# Patient Record
Sex: Female | Born: 1950 | Race: White | Hispanic: No | Marital: Married | Smoking: Former smoker
Health system: Southern US, Community
[De-identification: ages and names within clinical notes are randomized; demographics above are authoritative.]

## PROBLEM LIST (undated history)

## (undated) DIAGNOSIS — J45909 Unspecified asthma, uncomplicated: Secondary | ICD-10-CM

## (undated) DIAGNOSIS — K219 Gastro-esophageal reflux disease without esophagitis: Secondary | ICD-10-CM

## (undated) DIAGNOSIS — I10 Essential (primary) hypertension: Secondary | ICD-10-CM

## (undated) HISTORY — DX: Unspecified asthma, uncomplicated: J45.909

## (undated) HISTORY — DX: Gastro-esophageal reflux disease without esophagitis: K21.9

## (undated) HISTORY — PX: ABDOMINAL HYSTERECTOMY: SHX81

## (undated) HISTORY — DX: Essential (primary) hypertension: I10

---

## 2013-11-02 ENCOUNTER — Ambulatory Visit (INDEPENDENT_AMBULATORY_CARE_PROVIDER_SITE_OTHER): Payer: Self-pay

## 2013-11-02 ENCOUNTER — Ambulatory Visit (INDEPENDENT_AMBULATORY_CARE_PROVIDER_SITE_OTHER): Payer: Self-pay | Admitting: Physician Assistant

## 2013-11-02 ENCOUNTER — Encounter: Payer: Self-pay | Admitting: Physician Assistant

## 2013-11-02 VITALS — BP 140/77 | HR 94 | Ht 63.0 in | Wt 178.0 lb

## 2013-11-02 DIAGNOSIS — F411 Generalized anxiety disorder: Secondary | ICD-10-CM

## 2013-11-02 DIAGNOSIS — R0602 Shortness of breath: Secondary | ICD-10-CM

## 2013-11-02 DIAGNOSIS — F17200 Nicotine dependence, unspecified, uncomplicated: Secondary | ICD-10-CM

## 2013-11-02 DIAGNOSIS — J449 Chronic obstructive pulmonary disease, unspecified: Secondary | ICD-10-CM | POA: Insufficient documentation

## 2013-11-02 DIAGNOSIS — K219 Gastro-esophageal reflux disease without esophagitis: Secondary | ICD-10-CM

## 2013-11-02 DIAGNOSIS — Z720 Tobacco use: Secondary | ICD-10-CM

## 2013-11-02 NOTE — Progress Notes (Signed)
Subjective:    Patient ID: Ana King, female    DOB: Mar 23, 1951, 63 y.o.   MRN: [id number]  HPI Patient is a 63 year old female who presents to the clinic with her daughter to establish care. Patient has a past medical history of GERD and a 40 year tobacco use history. She takes over-the-counter Prevacid and it controls her acid reflux. She is currently trying to stop smoking about decreasing her cigarette. She is down to half a pack a day. She would like to get acupuncture that her husband did to help him stop smoking. She is not interested in medication today.   She is currently without insurance. She is concerned because she smokes and she has been more short of breath lately. She had a recent exacerbation with bronchitis about a month ago. She feels much improved but is still having to use her proair regularly. She feels like it helps her to breathe. She has noticed that she avoids walking 2 to shortness of breath. She denies any wheezing. She has a cough but nonproductive at this time. She denies any fever, chills, sinus pressure, ear pain.  Patient is concerned that she has lung cancer. She has some pain in her mid upper back on both sides. She admits to sleeping on the couch most nights. She has a habit of falling asleep watching TV. She denies any pain with breathing or coughing. Pain is when she presses on her back. She can take ibuprofen and gets rid of the pain.  [name]. History   Social History  . Marital Status: Married    Spouse Name: N/A    Number of Children: N/A  . Years of Education: N/A   Occupational History  . Not on file.   Social History Main Topics  . Smoking status: Current Every Day Smoker  . Smokeless tobacco: Not on file  . Alcohol Use: No  . Drug Use: Not on file  . Sexual Activity: No   Other Topics Concern  . Not on file   Social History Narrative  . No narrative on file   .Ana King does not currently have medications on file. . Family History   Problem Relation Age of Onset  . Cancer Mother   . Diabetes Maternal Aunt   . Hypertension Maternal Aunt   . Cancer Maternal Grandmother   . Diabetes Maternal Grandmother       Review of Systems  All other systems reviewed and are negative.      Objective:   Physical Exam  Constitutional: She is oriented to person, place, and time. She appears well-developed and well-nourished.  HENT:  Head: Normocephalic and atraumatic.  Cardiovascular: Normal rate, regular rhythm and normal heart sounds.   Pulmonary/Chest: Effort normal and breath sounds normal. She has no wheezes.  Musculoskeletal:  Pain over her rhomboid muscles to palpation bilateral sides of back. No pain over her spine.  Neurological: She is alert and oriented to person, place, and time.  Skin: Skin is dry.  Psychiatric: She has a normal mood and affect. Her behavior is normal.          Assessment & Plan:  Shortness of breath/tobacco abuse/COPD- pulse ox is 90% today. patient does not have insurance at this time. We have discussed spirometry to confirm COPD but decided against that since she has no way to pay. Will start with Spriva one puff daily. Samples were given today. She has to get insurance in the next month or so. We will  then send to the pharmacy. Followup in 4-6 weeks. Patient hasn't every day smoker for 40 years. Will get a chest x-ray. This will help ease her concerns of lung cancer and confirm lung changes. Patient has decreased number of cigarettes for the past couple months. She reports she is going to get acupuncture like her husband to see if she can stop smoking. Discussed that is the best that port stopping progression of COPD.  Mid back pain- discuss with patient very suggestive of musculoskeletal issue. She is sleeping on the couch most every night. Discussed stop sleeping on the couch pressure she's got good supportive bed. Can take ibuprofen and consider heat and ice to relax the muscles. If not  improving or if worsening please call office.  GERD-patient gets Prevacid over-the-counter do to no insurance. Continue with treatment.   Advance directives information was given to patient.  Depression screening was 0/2. GAD 7 was 6. Mild anxiety. We discussed this in detail and were lysed it was more about her health and how she constantly thinks of the worse case in area. Right now she is coming in she has lung cancer.

## 2013-11-02 NOTE — Patient Instructions (Addendum)
I puff daily.   Smoking Cessation Quitting smoking is important to your health and has many advantages. However, it is not always easy to quit since nicotine is a very addictive drug. Often times, people try 3 times or more before being able to quit. This document explains the best ways for you to prepare to quit smoking. Quitting takes hard work and a lot of effort, but you can do it. ADVANTAGES OF QUITTING SMOKING  You will live longer, feel better, and live better.  Your body will feel the impact of quitting smoking almost immediately.  Within 20 minutes, blood pressure decreases. Your pulse returns to its normal level.  After 8 hours, carbon monoxide levels in the blood return to normal. Your oxygen level increases.  After 24 hours, the chance of having a heart attack starts to decrease. Your breath, hair, and body stop smelling like smoke.  After 48 hours, damaged nerve endings begin to recover. Your sense of taste and smell improve.  After 72 hours, the body is virtually free of nicotine. Your bronchial tubes relax and breathing becomes easier.  After 2 to 12 weeks, lungs can hold more air. Exercise becomes easier and circulation improves.  The risk of having a heart attack, stroke, cancer, or lung disease is greatly reduced.  After 1 year, the risk of coronary heart disease is cut in half.  After 5 years, the risk of stroke falls to the same as a nonsmoker.  After 10 years, the risk of lung cancer is cut in half and the risk of other cancers decreases significantly.  After 15 years, the risk of coronary heart disease drops, usually to the level of a nonsmoker.  If you are pregnant, quitting smoking will improve your chances of having a healthy baby.  The people you live with, especially any children, will be healthier.  You will have extra money to spend on things other than cigarettes. QUESTIONS TO THINK ABOUT BEFORE ATTEMPTING TO QUIT You may want to talk about your  answers with your caregiver.  Why do you want to quit?  If you tried to quit in the past, what helped and what did not?  What will be the most difficult situations for you after you quit? How will you plan to handle them?  Who can help you through the tough times? Your family? Friends? A caregiver?  What pleasures do you get from smoking? What ways can you still get pleasure if you quit? Here are some questions to ask your caregiver:  How can you help me to be successful at quitting?  What medicine do you think would be best for me and how should I take it?  What should I do if I need more help?  What is smoking withdrawal like? How can I get information on withdrawal? GET READY  Set a quit date.  Change your environment by getting rid of all cigarettes, ashtrays, matches, and lighters in your home, car, or work. Do not let people smoke in your home.  Review your past attempts to quit. Think about what worked and what did not. GET SUPPORT AND ENCOURAGEMENT You have a better chance of being successful if you have help. You can get support in many ways.  Tell your family, friends, and co-workers that you are going to quit and need their support. Ask them not to smoke around you.  Get individual, group, or telephone counseling and support. Programs are available at [facility]. Call  your local health department for information about programs in your area.  Spiritual beliefs and practices may help some smokers quit.  Download a "quit meter" on your computer to keep track of quit statistics, such as how long you have gone without smoking, cigarettes not smoked, and money saved.  Get a self-help book about quitting smoking and staying off of tobacco. LEARN NEW SKILLS AND BEHAVIORS  Distract yourself from urges to smoke. Talk to someone, go for a walk, or occupy your time with a task.  Change your normal routine. Take a different route to work. Drink tea  instead of coffee. Eat breakfast in a different place.  Reduce your stress. Take a hot bath, exercise, or read a book.  Plan something enjoyable to do every day. Reward yourself for not smoking.  Explore interactive web-based programs that specialize in helping you quit. GET MEDICINE AND USE IT CORRECTLY Medicines can help you stop smoking and decrease the urge to smoke. Combining medicine with the above behavioral methods and support can greatly increase your chances of successfully quitting smoking.  Nicotine replacement therapy helps deliver nicotine to your body without the negative effects and risks of smoking. Nicotine replacement therapy includes nicotine gum, lozenges, inhalers, nasal sprays, and skin patches. Some may be available over-the-counter and others require a prescription.  Antidepressant medicine helps people abstain from smoking, but how this works is unknown. This medicine is available by prescription.  Nicotinic receptor partial agonist medicine simulates the effect of nicotine in your brain. This medicine is available by prescription. Ask your caregiver for advice about which medicines to use and how to use them based on your health history. Your caregiver will tell you what side effects to look out for if you choose to be on a medicine or therapy. Carefully read the information on the package. Do not use any other product containing nicotine while using a nicotine replacement product.  RELAPSE OR DIFFICULT SITUATIONS Most relapses occur within the first 3 months after quitting. Do not be discouraged if you start smoking again. Remember, most people try several times before finally quitting. You may have symptoms of withdrawal because your body is used to nicotine. You may crave cigarettes, be irritable, feel very hungry, cough often, get headaches, or have difficulty concentrating. The withdrawal symptoms are only temporary. They are strongest when you first quit, but they  will go away within 10 14 days. To reduce the chances of relapse, try to:  Avoid drinking alcohol. Drinking lowers your chances of successfully quitting.  Reduce the amount of caffeine you consume. Once you quit smoking, the amount of caffeine in your body increases and can give you symptoms, such as a rapid heartbeat, sweating, and anxiety.  Avoid smokers because they can make you want to smoke.  Do not let weight gain distract you. Many smokers will gain weight when they quit, usually less than 10 pounds. Eat a healthy diet and stay active. You can always lose the weight gained after you quit.  Find ways to improve your mood other than smoking. FOR MORE INFORMATION  www.smokefree.gov  Document Released: 07/01/2001 Document Revised: 01/06/2012 Document Reviewed: 10/16/2011 [facility] Patient Information 2014 [employer] [place].

## 2013-12-07 ENCOUNTER — Encounter: Payer: Self-pay | Admitting: Physician Assistant

## 2013-12-07 ENCOUNTER — Ambulatory Visit (INDEPENDENT_AMBULATORY_CARE_PROVIDER_SITE_OTHER): Payer: Self-pay | Admitting: Physician Assistant

## 2013-12-07 VITALS — BP 148/92 | HR 91 | Ht 64.0 in | Wt 177.0 lb

## 2013-12-07 DIAGNOSIS — K449 Diaphragmatic hernia without obstruction or gangrene: Secondary | ICD-10-CM

## 2013-12-07 DIAGNOSIS — R0789 Other chest pain: Secondary | ICD-10-CM

## 2013-12-07 DIAGNOSIS — K219 Gastro-esophageal reflux disease without esophagitis: Secondary | ICD-10-CM

## 2013-12-07 MED ORDER — SUCRALFATE 1 G PO TABS: 1.0000 g | ORAL_TABLET | Freq: Four times a day (QID) | ORAL | Status: DC

## 2013-12-07 MED ORDER — OMEPRAZOLE 40 MG PO CPDR: 40.0000 mg | DELAYED_RELEASE_CAPSULE | Freq: Every day | ORAL | Status: DC

## 2013-12-07 MED ORDER — GI COCKTAIL ~~LOC~~
30.0000 mL | Freq: Once | ORAL | Status: AC
  Administered 2013-12-07: 30 mL via ORAL

## 2013-12-07 NOTE — Progress Notes (Signed)
   Subjective:    Patient ID: Ana King, female    DOB: [birth year], 63 y.o.   MRN: [id number]  HPI Patient is a 63 year old female who presents to the clinic with atypical chest pain coming from the epigastric area into her left chest. Her first symptoms started yesterday and continued into today. They are not worsening but staying the same. She's had similar symptoms in the past and has always ended up being GERD or hiatal hernia related. She has been to ER in the past 4 years and always GI related. Her husband is concerned and that is why she is here today. She denies any arm pain or jaw pain. She denies any vomiting or sweating. Pain right now is 1/10 on a 10 scale. She continues to eat and drink. Her bowel movements are unchanged and denies any blood or mucus. Symptoms are worse in the morning and late at night. She has taken Prevacid twice a day over-the-counter. She admits to eating a lot of [place] spicy foods this week.  Review of Systems  All other systems reviewed and are negative.      Objective:   Physical Exam  Constitutional: She is oriented to person, place, and time. She appears well-developed and well-nourished.  HENT:  Head: Normocephalic and atraumatic.  Cardiovascular: Normal rate, regular rhythm and normal heart sounds.   No murmur heard. Pulmonary/Chest: Effort normal and breath sounds normal. She has no wheezes.  Abdominal: Soft. Bowel sounds are normal. She exhibits no distension and no mass. There is no tenderness. There is no rebound and no guarding.  Neurological: She is alert and oriented to person, place, and time.  Skin: Skin is dry.  Psychiatric: She has a normal mood and affect. Her behavior is normal.          Assessment & Plan:  Chest pain/GERD/Hiatal hernia- GI cocktail given in office with 90 percent relief.  EKG- NSR at 96. No ST elevation or depression. Incomplete RBBB. No EKG to compare.   Reassured pt does not appear cardiac. If symptoms  continue could consider stress test.  Will start omeprazole daily. Carafate given to use as needed at meal time.  I do not feel like it is any ulcer at this point. If not improving could consider h.pylori testing. No tenderness over RUQ-gallbladder etiology unlikely.  GERD diet handout given. Call with any complications.   Pt still does not have insurance. When get's insurance will get health maintence items done.

## 2013-12-09 DIAGNOSIS — K449 Diaphragmatic hernia without obstruction or gangrene: Secondary | ICD-10-CM | POA: Insufficient documentation

## 2013-12-14 ENCOUNTER — Encounter: Payer: Self-pay | Admitting: *Deleted

## 2014-01-18 ENCOUNTER — Telehealth: Payer: Self-pay | Admitting: *Deleted

## 2014-01-18 ENCOUNTER — Other Ambulatory Visit: Payer: Self-pay | Admitting: Physician Assistant

## 2014-01-18 MED ORDER — ALBUTEROL SULFATE (2.5 MG/3ML) 0.083% IN NEBU: 2.5000 mg | INHALATION_SOLUTION | RESPIRATORY_TRACT | Status: DC | PRN

## 2014-01-18 NOTE — Telephone Encounter (Signed)
Ok will send

## 2014-01-18 NOTE — Telephone Encounter (Signed)
Pt's daughter was in office today & stated that pt wanted to know if you could rx her albuterol neb solution.  She has a neb at home.

## 2014-01-18 NOTE — Telephone Encounter (Signed)
Pt notified of rx. 

## 2014-03-15 ENCOUNTER — Encounter: Payer: Self-pay | Admitting: Physician Assistant

## 2014-03-15 ENCOUNTER — Ambulatory Visit (INDEPENDENT_AMBULATORY_CARE_PROVIDER_SITE_OTHER): Payer: Self-pay | Admitting: Physician Assistant

## 2014-03-15 VITALS — BP 128/83 | HR 95 | Temp 98.2°F | Ht 64.0 in | Wt 180.0 lb

## 2014-03-15 DIAGNOSIS — R1032 Left lower quadrant pain: Secondary | ICD-10-CM

## 2014-03-15 DIAGNOSIS — R319 Hematuria, unspecified: Secondary | ICD-10-CM

## 2014-03-15 DIAGNOSIS — D72829 Elevated white blood cell count, unspecified: Secondary | ICD-10-CM

## 2014-03-15 LAB — CBC WITH DIFFERENTIAL/PLATELET
Basophils Absolute: 0 10*3/uL (ref 0.0–0.1)
Basophils Relative: 0 % (ref 0–1)
EOS PCT: 1 % (ref 0–5)
Eosinophils Absolute: 0.1 10*3/uL (ref 0.0–0.7)
HCT: 41.7 % (ref 36.0–46.0)
HEMOGLOBIN: 14.1 g/dL (ref 12.0–15.0)
Lymphocytes Relative: 21 % (ref 12–46)
Lymphs Abs: 2.4 10*3/uL (ref 0.7–4.0)
MCH: 29.7 pg (ref 26.0–34.0)
MCHC: 33.8 g/dL (ref 30.0–36.0)
MCV: 87.9 fL (ref 78.0–100.0)
MONO ABS: 0.3 10*3/uL (ref 0.1–1.0)
MONOS PCT: 3 % (ref 3–12)
Neutro Abs: 8.4 10*3/uL — ABNORMAL HIGH (ref 1.7–7.7)
Neutrophils Relative %: 75 % (ref 43–77)
Platelets: 308 10*3/uL (ref 150–400)
RBC: 4.74 MIL/uL (ref 3.87–5.11)
RDW: 12.9 % (ref 11.5–15.5)
WBC: 11.2 10*3/uL — ABNORMAL HIGH (ref 4.0–10.5)

## 2014-03-15 LAB — COMPLETE METABOLIC PANEL WITH GFR
ALBUMIN: 4.7 g/dL (ref 3.5–5.2)
ALT: 10 U/L (ref 0–35)
AST: 10 U/L (ref 0–37)
Alkaline Phosphatase: 104 U/L (ref 39–117)
BUN: 13 mg/dL (ref 6–23)
CALCIUM: 10 mg/dL (ref 8.4–10.5)
CO2: 23 meq/L (ref 19–32)
Chloride: 101 mEq/L (ref 96–112)
Creat: 0.71 mg/dL (ref 0.50–1.10)
GFR, Est Non African American: >89 mL/min
Glucose, Bld: 102 mg/dL — ABNORMAL HIGH (ref 70–99)
POTASSIUM: 4.8 meq/L (ref 3.5–5.3)
Sodium: 138 mEq/L (ref 135–145)
Total Bilirubin: 0.5 mg/dL (ref 0.2–1.2)
Total Protein: 7.5 g/dL (ref 6.0–8.3)

## 2014-03-15 LAB — POCT URINALYSIS DIPSTICK
BILIRUBIN UA: NEGATIVE
Glucose, UA: NEGATIVE
Ketones, UA: NEGATIVE
Leukocytes, UA: NEGATIVE
Nitrite, UA: NEGATIVE
PROTEIN UA: NEGATIVE
Spec Grav, UA: 1.01
Urobilinogen, UA: 1
pH, UA: 6.5

## 2014-03-15 NOTE — Progress Notes (Signed)
Subjective:    Patient ID: Ana King, female    DOB: 22-Jan-1951, 63 y.o.   MRN: [id number]  HPI Pt presents to the clinic with left lower quadrant pain. Started last week about 6 days ago. Pain is not constant but occasional. Ibuprofen dulls the pain. Pain when occurs is very sharp about 5/10. She denies any constipation or diarrhea. No nausea or vomiting. No fever or chills. Ibuprofen seems to help. Pain is getting better and not using ibuprofen anymore. Still occurs all of the sudden and abdomen is tender. 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 No urinary pain or frequency changes. No acid reflux symptoms.    Review of Systems  All other systems reviewed and are negative.      Objective:   Physical Exam  Constitutional: She is oriented to person, place, and time. She appears well-developed and well-nourished.  HENT:  Head: Normocephalic and atraumatic.  Cardiovascular: Normal rate, regular rhythm and normal heart sounds.   Pulmonary/Chest: Effort normal and breath sounds normal.  No CVA tenderness.   Abdominal: Soft.  LLQ pain with deep palpation. No rebound or guarding. No masses.   Neurological: She is alert and oriented to person, place, and time.  Skin: Skin is dry.  Psychiatric: She has a normal mood and affect. Her behavior is normal.          Assessment & Plan:  LLQ pain- low suscipion for diverticulitis. Will get CBC and CMP. .. Results for orders placed in visit on 03/15/14  CBC WITH DIFFERENTIAL      Result Value Ref Range   WBC 11.2 (*) 4.0 - 10.5 K/uL   RBC 4.74  3.87 - 5.11 MIL/uL   Hemoglobin 14.1  12.0 - 15.0 g/dL   HCT 41.7  36.0 - 46.0 %   MCV 87.9  78.0 - 100.0 fL   MCH 29.7  26.0 - 34.0 pg   MCHC 33.8  30.0 - 36.0 g/dL   RDW 12.9  11.5 - 15.5 %   Platelets 308  150 - 400 K/uL   Neutrophils Relative % 75  43 - 77 %   Neutro Abs 8.4 (*) 1.7 - 7.7 K/uL   Lymphocytes Relative 21  12 - 46 %   Lymphs Abs 2.4  0.7 - 4.0 K/uL   Monocytes Relative 3  3 -  12 %   Monocytes Absolute 0.3  0.1 - 1.0 K/uL   Eosinophils Relative 1  0 - 5 %   Eosinophils Absolute 0.1  0.0 - 0.7 K/uL   Basophils Relative 0  0 - 1 %   Basophils Absolute 0.0  0.0 - 0.1 K/uL   Smear Review Criteria for review not met    COMPLETE METABOLIC PANEL WITH GFR      Result Value Ref Range   Sodium 138  135 - 145 mEq/L   Potassium 4.8  3.5 - 5.3 mEq/L   Chloride 101  96 - 112 mEq/L   CO2 23  19 - 32 mEq/L   Glucose, Bld 102 (*) 70 - 99 mg/dL   BUN 13  6 - 23 mg/dL   Creat 0.71  0.50 - 1.10 mg/dL   Total Bilirubin 0.5  0.2 - 1.2 mg/dL   Alkaline Phosphatase 104  39 - 117 U/L   AST 10  0 - 37 U/L   ALT 10  0 - 35 U/L   Total Protein 7.5  6.0 - 8.3 g/dL   Albumin 4.7  3.5 -  5.2 g/dL   Calcium 10.0  8.4 - 10.5 mg/dL   GFR, Est African American >89     GFR, Est Non African American >89    POCT URINALYSIS DIPSTICK      Result Value Ref Range   Color, UA yellow     Clarity, UA clear     Glucose, UA neg     Bilirubin, UA neg     Ketones, UA neg     Spec Grav, UA 1.010     Blood, UA trace-lysed     pH, UA 6.5     Protein, UA neg     Urobilinogen, UA 1.0     Nitrite, UA neg     Leukocytes, UA Negative     Trace blood will get urine culture.  Discussed with pt WBC being elevated. Will get STAT CT scan of abd/pelvis.  Red flags or worsening symptoms discussed and to call office.  Follow up as needed.

## 2014-03-17 ENCOUNTER — Ambulatory Visit (INDEPENDENT_AMBULATORY_CARE_PROVIDER_SITE_OTHER): Payer: Self-pay

## 2014-03-17 DIAGNOSIS — D72829 Elevated white blood cell count, unspecified: Secondary | ICD-10-CM

## 2014-03-17 DIAGNOSIS — R1032 Left lower quadrant pain: Secondary | ICD-10-CM

## 2014-03-17 DIAGNOSIS — K5732 Diverticulitis of large intestine without perforation or abscess without bleeding: Secondary | ICD-10-CM

## 2014-03-17 DIAGNOSIS — K7689 Other specified diseases of liver: Secondary | ICD-10-CM

## 2014-03-17 DIAGNOSIS — Z90710 Acquired absence of both cervix and uterus: Secondary | ICD-10-CM

## 2014-03-17 LAB — URINE CULTURE

## 2014-03-17 MED ORDER — IOHEXOL 300 MG/ML  SOLN
100.0000 mL | Freq: Once | INTRAMUSCULAR | Status: AC | PRN
  Administered 2014-03-17: 100 mL via INTRAVENOUS

## 2014-04-17 ENCOUNTER — Encounter: Payer: Self-pay | Admitting: Physician Assistant

## 2014-04-17 ENCOUNTER — Ambulatory Visit (INDEPENDENT_AMBULATORY_CARE_PROVIDER_SITE_OTHER): Payer: Self-pay | Admitting: Physician Assistant

## 2014-04-17 VITALS — BP 143/76 | HR 93 | Ht 64.0 in | Wt 185.0 lb

## 2014-04-17 DIAGNOSIS — J441 Chronic obstructive pulmonary disease with (acute) exacerbation: Secondary | ICD-10-CM

## 2014-04-17 DIAGNOSIS — J42 Unspecified chronic bronchitis: Secondary | ICD-10-CM

## 2014-04-17 MED ORDER — PREDNISONE 50 MG PO TABS: ORAL_TABLET | ORAL | Status: DC

## 2014-04-17 MED ORDER — TIOTROPIUM BROMIDE MONOHYDRATE 2.5 MCG/ACT IN AERS: INHALATION_SPRAY | RESPIRATORY_TRACT | Status: DC

## 2014-04-17 NOTE — Patient Instructions (Signed)
Take prednisone daily for 5 days.  Start back on sprivia daily 2 puffs daily.

## 2014-04-17 NOTE — Progress Notes (Addendum)
   Subjective:    Patient ID: Ana King, female    DOB: 1950-09-14, 63 y.o.   MRN: [id number]  HPI Pt presents to the clinic with her husband. She comes in because she has been more SOB when walking and moving. She is on her 5th day of no smoking. She denies any productive cough. She denies any fever, chills, n/v/d, headache, wheezing. She has not tried anything to make better. She has been out of spiriva samples for last couple of weeks. She does not have rescue inhaler due to cost    Review of Systems  All other systems reviewed and are negative.      Objective:   Physical Exam  Constitutional: She is oriented to person, place, and time. She appears well-developed and well-nourished.  HENT:  Head: Normocephalic and atraumatic.  Right Ear: External ear normal.  Left Ear: External ear normal.  Nose: Nose normal.  Mouth/Throat: Oropharynx is clear and moist.  TM's clear.  Negative maxillary tenderness.    Eyes: Conjunctivae are normal. Right eye exhibits no discharge. Left eye exhibits no discharge.  Neck: Normal range of motion. Neck supple.  Cardiovascular: Normal rate, regular rhythm and normal heart sounds.   Pulmonary/Chest: Effort normal and breath sounds normal. She has no wheezes.  Coarse breath sounds.   Lymphadenopathy:    She has no cervical adenopathy.  Neurological: She is alert and oriented to person, place, and time.  Skin: Skin is dry.  Psychiatric: She has a normal mood and affect. Her behavior is normal.          Assessment & Plan:  COPD exacerbation- prednisone  for 5 days given. Restart spiriva. Gave respimat because only sample we had. Went over how to use inhaler. 2 puffs once daily. I really think going off maintenance inhaler causes worsening SOB. No samples of albuterol. Pt cannot afford. She does have nebulizer at home instructed to use every 4-6 hours as needed for SOB. If not improving or worsening then follow up or call to decide on  further treatment. Pt is working on insurance but right now we are going to have to keep her with samples.

## 2014-04-25 ENCOUNTER — Telehealth: Payer: Self-pay | Admitting: Family Medicine

## 2014-04-25 MED ORDER — IPRATROPIUM-ALBUTEROL 20-100 MCG/ACT IN AERS: 1.0000 | INHALATION_SPRAY | Freq: Four times a day (QID) | RESPIRATORY_TRACT | Status: DC | PRN

## 2014-04-25 NOTE — Telephone Encounter (Signed)
Pt with her husband today requesting samples of spiriva, we didn't have any of these samples but she was provided with a combivent respimat as a substitution until she can follow up with her PCP Ana King.

## 2014-05-24 ENCOUNTER — Ambulatory Visit (INDEPENDENT_AMBULATORY_CARE_PROVIDER_SITE_OTHER): Payer: Self-pay | Admitting: *Deleted

## 2014-05-24 ENCOUNTER — Encounter: Payer: Self-pay | Admitting: Family Medicine

## 2014-05-24 ENCOUNTER — Ambulatory Visit (INDEPENDENT_AMBULATORY_CARE_PROVIDER_SITE_OTHER): Payer: Self-pay | Admitting: Family Medicine

## 2014-05-24 VITALS — Temp 97.8°F

## 2014-05-24 DIAGNOSIS — Z23 Encounter for immunization: Secondary | ICD-10-CM

## 2014-05-24 NOTE — Progress Notes (Signed)
Patient ID: [name], female   DOB: 1951/06/17, 63 y.o.   MRN: [id number] Pt here for flu shot. Immunization given and tolerated well.Loralee PacasBarkley, [name]

## 2014-06-26 ENCOUNTER — Other Ambulatory Visit: Payer: Self-pay | Admitting: *Deleted

## 2014-06-26 ENCOUNTER — Encounter: Payer: Self-pay | Admitting: Physician Assistant

## 2014-06-26 MED ORDER — TIOTROPIUM BROMIDE MONOHYDRATE 2.5 MCG/ACT IN AERS: INHALATION_SPRAY | RESPIRATORY_TRACT | Status: DC

## 2014-07-31 ENCOUNTER — Ambulatory Visit (INDEPENDENT_AMBULATORY_CARE_PROVIDER_SITE_OTHER): Payer: Self-pay | Admitting: Physician Assistant

## 2014-07-31 ENCOUNTER — Encounter: Payer: Self-pay | Admitting: Physician Assistant

## 2014-07-31 VITALS — BP 139/87 | HR 93 | Temp 97.3°F | Wt 192.0 lb

## 2014-07-31 DIAGNOSIS — J441 Chronic obstructive pulmonary disease with (acute) exacerbation: Secondary | ICD-10-CM

## 2014-07-31 MED ORDER — PREDNISONE 50 MG PO TABS: ORAL_TABLET | ORAL | Status: DC

## 2014-07-31 MED ORDER — FLUTICASONE FUROATE-VILANTEROL 100-25 MCG/INH IN AEPB: 1.0000 | INHALATION_SPRAY | Freq: Every day | RESPIRATORY_TRACT | Status: DC

## 2014-07-31 NOTE — Progress Notes (Signed)
   Subjective:    Patient ID: Ana King, female    DOB: 20-Mar-1951, 64 y.o.   MRN: [id number]  HPI Patient presents to the clinic with worsening shortness of breath. She has a diagnosis of COPD. Patient does not have insurance and not able to afford inhalers. She ran out of Spiriva 1-2 weeks ago. She has noticed best when her shortness of breath occurs. She feels like she cannot do her daily tasks as she could on Spiriva. I will visit she was also given Combivent she cannot afford this either. 2 months and if there is anything she can afford. She denies any fever, chills, productive cough.   Review of Systems  All other systems reviewed and are negative.      Objective:   Physical Exam  Constitutional: She is oriented to person, place, and time. She appears well-developed and well-nourished.  HENT:  Head: Normocephalic and atraumatic.  Cardiovascular: Normal rate, regular rhythm and normal heart sounds.   Pulmonary/Chest: Effort normal and breath sounds normal. She has no wheezes.  Neurological: She is alert and oriented to person, place, and time.  Psychiatric: She has a normal mood and affect. Her behavior is normal.          Assessment & Plan:  COPD exacerbation- it is likely that exacerbation is due to not being on Spiriva. Brief it does not have a car to make it more affordable. We do not always have samples to give her. We'll try Breo 1 puff daily. Discussed how to use inhaler in office today. Coupon card given and explained how to activate intake to pharmacy. Hopefully this will give her 1 year free product. Follow-up if shortness of breath or breathing worsening or not improving. I did go ahead and give her 5 days of prednisone to get ahead of some of the inflammation from not being on any maintence inhaler.

## 2014-08-14 ENCOUNTER — Telehealth: Payer: Self-pay | Admitting: Physician Assistant

## 2014-08-14 NOTE — Telephone Encounter (Signed)
Give sample of spiriva to start back. Can use spiriva with BREO inhaler. Make sure using BREO correctly. If running fever, signifcant SOB needs to come in to listen to lungs.

## 2014-08-14 NOTE — Telephone Encounter (Signed)
Spiriva respimat sample given & logged in book.

## 2014-08-14 NOTE — Telephone Encounter (Signed)
Ana King the breo inhaler is no longer working for Ameren CorporationDebbie she has been on the couch for 2 days having trouble catching her breath. She stopped the inhaler yesterday and just used her Nebs every 4 hours wasn't sure if you wanted to see her or just try a different medication. - CF

## 2014-08-23 ENCOUNTER — Other Ambulatory Visit: Payer: Self-pay | Admitting: Physician Assistant

## 2014-10-04 ENCOUNTER — Encounter: Payer: Self-pay | Admitting: Physician Assistant

## 2014-10-04 ENCOUNTER — Ambulatory Visit (INDEPENDENT_AMBULATORY_CARE_PROVIDER_SITE_OTHER): Payer: Self-pay | Admitting: Physician Assistant

## 2014-10-04 VITALS — BP 147/80 | HR 89 | Ht 64.0 in | Wt 198.0 lb

## 2014-10-04 DIAGNOSIS — R0789 Other chest pain: Secondary | ICD-10-CM

## 2014-10-04 NOTE — Patient Instructions (Signed)
Consider zyrtec/claritin once daily.  flonase OtC 2 sprays each nostril  tums as needed for indigestion.

## 2014-10-04 NOTE — Progress Notes (Signed)
   Subjective:    Patient ID: Ana King, female    DOB: 1950-11-19, 64 y.o.   MRN: [id number]  HPI  Pt presents to the clinic with atypical chest pain that occurred once on Monday night for a few hours. Since has been fine. Did a nebulizer before she went to bed. Pain took her breath away and felt like some pressure on chest. No wheezing. On spiriva. Taking prevacid. Does not remember what she had to eat that night. No left arm numbness, no jaw pain.    Review of Systems  All other systems reviewed and are negative.      Objective:   Physical Exam  Constitutional: She is oriented to person, place, and time. She appears well-developed and well-nourished.  HENT:  Head: Normocephalic and atraumatic.  Cardiovascular: Normal rate, regular rhythm and normal heart sounds.   Pulmonary/Chest: Effort normal and breath sounds normal. She has no wheezes.  Neurological: She is alert and oriented to person, place, and time.  Skin: Skin is dry.  Psychiatric: She has a normal mood and affect. Her behavior is normal.          Assessment & Plan:  Atypical chest pain- only one episode. EKG unchanged. Pulse ox looks good today. Could have breathed in some dust or had some indigestion. If keeps occuring. Consider stress test.   COPD- controlled. Pt given sample of stilto because we did not have sprivia.

## 2014-10-23 ENCOUNTER — Telehealth: Payer: Self-pay | Admitting: *Deleted

## 2014-10-23 MED ORDER — ALBUTEROL SULFATE (2.5 MG/3ML) 0.083% IN NEBU: INHALATION_SOLUTION | RESPIRATORY_TRACT | Status: DC

## 2014-10-23 NOTE — Telephone Encounter (Signed)
See if will have spiriva sample or stiolto?

## 2014-10-24 NOTE — Telephone Encounter (Signed)
Spoke with Mr Ana King  & informed that a sample inhaler is @ the front Office for Ana King. Mr Ana King asked if [name] was in today that she could bring it.

## 2015-03-08 ENCOUNTER — Encounter: Payer: Self-pay | Admitting: Family Medicine

## 2015-03-08 ENCOUNTER — Ambulatory Visit (INDEPENDENT_AMBULATORY_CARE_PROVIDER_SITE_OTHER): Payer: Self-pay | Admitting: Family Medicine

## 2015-03-08 VITALS — BP 144/90 | HR 95 | Ht 64.0 in | Wt 197.0 lb

## 2015-03-08 DIAGNOSIS — R0602 Shortness of breath: Secondary | ICD-10-CM

## 2015-03-08 DIAGNOSIS — R0789 Other chest pain: Secondary | ICD-10-CM

## 2015-03-08 DIAGNOSIS — J42 Unspecified chronic bronchitis: Secondary | ICD-10-CM

## 2015-03-08 MED ORDER — AZITHROMYCIN 250 MG PO TABS: ORAL_TABLET | ORAL | Status: DC

## 2015-03-08 MED ORDER — PREDNISONE 20 MG PO TABS: 40.0000 mg | ORAL_TABLET | Freq: Every day | ORAL | Status: DC

## 2015-03-08 NOTE — Patient Instructions (Signed)
Do not fill the prednisone and antibiotic and let she feels like you aren't getting worse, developed a cough with sputum, or certainly run a fever. Please let us know if you do start taking the antibiotic.  If chest pain persists or suddenly gets worse or radiates into her neck or arm then please go to the emergency department.

## 2015-03-08 NOTE — Progress Notes (Signed)
Subjective:    Patient ID: Ana King, female    DOB: 07-13-1951, 64 y.o.   MRN: [id number]  HPI 62 you female with a hx of COPD.  She is now on Stiolo and doing well and uses her albuterol tx every night before bedtime.   C/O of  pain under her left breast Mon that started while she was just sitting, no pain Tues, then a little more discomfort since yesterday when active. Started feeling more SOB yesterday with activity. Has not hearing some bubbling in her abdominal. No abdominal pain. Normal BMS. Says the pain comes and goes. Better with IBUprofen. Pain lasts seconds at a time. Feels like a squeezing.  No nausea or vomiting. No cough. Has been sneezing a lot.    Review of Systems     BP 144/90 mmHg  Pulse 95  Ht 5\' 4"  (1.626 m)  Wt 197 lb (89.359 kg)  BMI 33.80 kg/m2  SpO2 95%  PF     Allergies  Allergen Reactions  . Penicillins     Past Medical History  Diagnosis Date  . GERD (gastroesophageal reflux disease)     Past Surgical History  Procedure Laterality Date  . Abdominal hysterectomy      Social History   Social History  . Marital Status: Married    Spouse Name: N/A  . Number of Children: N/A  . Years of Education: N/A   Occupational History  . Not on file.   Social History Main Topics  . Smoking status: Current Every Day Smoker    Start date: 05/15/2014  . Smokeless tobacco: Not on file  . Alcohol Use: No  . Drug Use: Not on file  . Sexual Activity: No   Other Topics Concern  . Not on file   Social History Narrative    Family History  Problem Relation Age of Onset  . Cancer Mother   . Diabetes Maternal Aunt   . Hypertension Maternal Aunt   . Cancer Maternal Grandmother   . Diabetes Maternal Grandmother     Outpatient Encounter Prescriptions as of 03/08/2015  Medication Sig  . albuterol (PROVENTIL) (2.5 MG/3ML) 0.083% nebulizer solution inhale contents of 1 vial every 4 hours if needed for wheezing or shortness of breath  .  Lansoprazole (PREVACID PO) Take 20 mg by mouth 2 (two) times daily.  . Tiotropium Bromide-Olodaterol (STIOLTO RESPIMAT) 2.5-2.5 MCG/ACT AERS Inhale into the lungs.  [name] azithromycin (ZITHROMAX) 250 MG tablet 2 tabs on Day 1, then one a day x 4 days.  . predniSONE (DELTASONE) 20 MG tablet Take 2 tablets (40 mg total) by mouth daily.  . [DISCONTINUED] predniSONE (DELTASONE) 50 MG tablet Take one tablet for 5 days.  . [DISCONTINUED] sucralfate (CARAFATE) 1 G tablet Take 1 tablet (1 g total) by mouth 4 (four) times daily.  . [DISCONTINUED] tiotropium (SPIRIVA) 18 MCG inhalation capsule Place 18 mcg into inhaler and inhale daily.   No facility-administered encounter medications on file as of 03/08/2015.       Objective:   Physical Exam  Constitutional: She is oriented to person, place, and time. She appears well-developed and well-nourished.  HENT:  Head: Normocephalic and atraumatic.  Right Ear: External ear normal.  Left Ear: External ear normal.  Nose: Nose normal.  Mouth/Throat: Oropharynx is clear and moist.  TMs and canals are clear.   Eyes: Conjunctivae and EOM are normal. Pupils are equal, round, and reactive to light.  Neck: Neck supple. No thyromegaly present.  Cardiovascular:  Normal rate, regular rhythm and normal heart sounds.   Pulmonary/Chest: Effort normal and breath sounds normal. She has no wheezes. She exhibits tenderness.  Tender over the lower sternum   Lymphadenopathy:    She has no cervical adenopathy.  Neurological: She is alert and oriented to person, place, and time.  Skin: Skin is warm and dry.  Psychiatric: She has a normal mood and affect.          Assessment & Plan:  COPD, chronic - she is more short of breath but denies any cough or sputum. She has had some sneezing says she's not sure she may be coming down with a cold or not. Since we are getting ready to have into the weekend and got a printed a prescription for prednisone and an antibiotic just in  case over the weekend she feels like she's developing a cough or suddenly gets worse. She develops a fever then please let us know. Use albuterol every 4-6 hours as needed. Continue with  Stiolto.  Left chest pain under breast/atypical chest pain - EKG shows normal sinus rhythm with no acute changes and normal axis. No changes from previous. When she's able to get insurance think she might benefit from a treadmill stress test and she's now had a couple episodes of chest pain though this episode isn't a different location than when she was seen back in March which is why had decided to repeat her EKG. She is a smoker. We'll also get a CMP and a CBC. Again she doesn't have insurance so I'm not sure if she will be able to afford the blood work but encouraged her to go to the lab and find out how much it might cost. We did Stamp her paperwork as self-pay which should give her a discounted price.

## 2015-05-07 ENCOUNTER — Ambulatory Visit (INDEPENDENT_AMBULATORY_CARE_PROVIDER_SITE_OTHER): Payer: Self-pay | Admitting: Physician Assistant

## 2015-05-07 VITALS — BP 136/73 | HR 83 | Temp 98.5°F

## 2015-05-07 DIAGNOSIS — Z23 Encounter for immunization: Secondary | ICD-10-CM

## 2015-07-09 ENCOUNTER — Encounter: Payer: Self-pay | Admitting: Physician Assistant

## 2015-07-09 ENCOUNTER — Ambulatory Visit (INDEPENDENT_AMBULATORY_CARE_PROVIDER_SITE_OTHER): Payer: Self-pay | Admitting: Physician Assistant

## 2015-07-09 VITALS — BP 128/78 | HR 93 | Wt 200.0 lb

## 2015-07-09 DIAGNOSIS — IMO0001 Reserved for inherently not codable concepts without codable children: Secondary | ICD-10-CM

## 2015-07-09 DIAGNOSIS — J441 Chronic obstructive pulmonary disease with (acute) exacerbation: Secondary | ICD-10-CM

## 2015-07-09 DIAGNOSIS — R030 Elevated blood-pressure reading, without diagnosis of hypertension: Secondary | ICD-10-CM

## 2015-07-09 MED ORDER — PREDNISONE 20 MG PO TABS: ORAL_TABLET | ORAL | Status: DC

## 2015-07-09 NOTE — Progress Notes (Signed)
   Subjective:    Patient ID: Ana King, female    DOB: Apr 01, 1951, 64 y.o.   MRN: [id number]  HPI Patient is a 64 year old female who presents to the clinic with increased shortness of breath and chest tightness for the last week. Patient takes Stilto inhaler daily for COPD. she does use her rescue inhaler at bedtime every day but lately has been using it multiple times a day. She she feels very short of breath, wheezing and chest tightness. She denies any ear pain, sore throat, sinus pressure, nasal congestion, headache, nausea, fevers or body aches. She has not tried anything to make better. She believes that when she colored her sister's hair she inhaled some of the chemicals and it started these symptoms.   Review of Systems  All other systems reviewed and are negative.      Objective:   Physical Exam  Constitutional: She is oriented to person, place, and time. She appears well-developed and well-nourished.  HENT:  Head: Normocephalic and atraumatic.  Right Ear: External ear normal.  Left Ear: External ear normal.  Nose: Nose normal.  Mouth/Throat: Oropharynx is clear and moist. No oropharyngeal exudate.  Eyes: Conjunctivae are normal. Right eye exhibits no discharge. Left eye exhibits no discharge.  Neck: Normal range of motion. Neck supple.  Cardiovascular: Normal rate, regular rhythm and normal heart sounds.   Pulmonary/Chest: Effort normal.  Coarse breath sounds.   Lymphadenopathy:    She has no cervical adenopathy.  Neurological: She is alert and oriented to person, place, and time.  Psychiatric: She has a normal mood and affect. Her behavior is normal.          Assessment & Plan:  COPD exacerbation due to chemical- pulse ox is reassuring. does not seem to be bacterial. Will treat with prednisone taper. Pt has printed abx copy if needed or if symptoms worsen. Continue stiolto daily. Rescue inhaler daily as needed for next couple of days. If worsening or not  improving call office.    Elevated blood pressure- rechecked and great. Will continue to monitor.

## 2015-07-12 ENCOUNTER — Other Ambulatory Visit: Payer: Self-pay | Admitting: Physician Assistant

## 2015-08-03 ENCOUNTER — Ambulatory Visit (INDEPENDENT_AMBULATORY_CARE_PROVIDER_SITE_OTHER): Payer: Self-pay | Admitting: Physician Assistant

## 2015-08-03 ENCOUNTER — Encounter: Payer: Self-pay | Admitting: Physician Assistant

## 2015-08-03 VITALS — BP 160/76 | HR 82 | Temp 98.8°F | Ht 64.0 in | Wt 204.0 lb

## 2015-08-03 DIAGNOSIS — F419 Anxiety disorder, unspecified: Secondary | ICD-10-CM

## 2015-08-03 DIAGNOSIS — J42 Unspecified chronic bronchitis: Secondary | ICD-10-CM

## 2015-08-03 DIAGNOSIS — F410 Panic disorder [episodic paroxysmal anxiety] without agoraphobia: Secondary | ICD-10-CM

## 2015-08-03 MED ORDER — SERTRALINE HCL 50 MG PO TABS: 50.0000 mg | ORAL_TABLET | Freq: Every day | ORAL | Status: DC

## 2015-08-03 MED ORDER — BUDESONIDE-FORMOTEROL FUMARATE 160-4.5 MCG/ACT IN AERO: 2.0000 | INHALATION_SPRAY | Freq: Two times a day (BID) | RESPIRATORY_TRACT | Status: DC

## 2015-08-03 MED ORDER — LORAZEPAM 0.5 MG PO TABS: 0.5000 mg | ORAL_TABLET | Freq: Three times a day (TID) | ORAL | Status: DC | PRN

## 2015-08-03 MED ORDER — TIOTROPIUM BROMIDE MONOHYDRATE 2.5 MCG/ACT IN AERS: INHALATION_SPRAY | RESPIRATORY_TRACT | Status: DC

## 2015-08-03 NOTE — Progress Notes (Signed)
   Subjective:    Patient ID: Ana King, female    DOB: 09-Apr-1951, 65 y.o.   MRN: [id number]  HPI  Pt presents to the clinic for COPD follow up. She is on stiloto 2 puffs once a day and albuterol some times twice a day. She denies any worsening cough or production. No sinus pressure, fever, ST. She is only SOB when walking and getting up. She also has noticed if she gets anxious or panic she has some SOB. Something as simple as balancing check book and noticing a mistake can make her feel SOB.     Review of Systems  All other systems reviewed and are negative.      Objective:   Physical Exam  Constitutional: She is oriented to person, place, and time. She appears well-developed and well-nourished.  HENT:  Head: Normocephalic and atraumatic.  Cardiovascular: Normal rate, regular rhythm and normal heart sounds.   Pulmonary/Chest: Effort normal and breath sounds normal.  Neurological: She is alert and oriented to person, place, and time.  Psychiatric: She has a normal mood and affect. Her behavior is normal.          Assessment & Plan:  COPD- pulse ox resting 97 with walking around building multiple times does not drop lower than 95 percent. Discussed need of reconditioning. Start daily walks at least 3 times a day. Stop stiloto. Start symbicort and spirivia. Samples given today. I picked these 2 because we keep samples and right now she does not have insurance. Follow up in 2 months or sooner if needed. Continue albuterol as needed.   Anxiety/panic- given a few ativan for panic attacks, discussed abuse potential and sedation warning. zoloft given to start daily. Follow up in 2 months.   No insurance: would like to get spironmetry when she gets this. We could also consider sleep apnea testing.

## 2015-08-28 ENCOUNTER — Ambulatory Visit (INDEPENDENT_AMBULATORY_CARE_PROVIDER_SITE_OTHER): Payer: Self-pay | Admitting: Physician Assistant

## 2015-08-28 ENCOUNTER — Encounter: Payer: Self-pay | Admitting: Physician Assistant

## 2015-08-28 VITALS — BP 147/76 | HR 85 | Ht 64.0 in | Wt 202.0 lb

## 2015-08-28 DIAGNOSIS — Z13220 Encounter for screening for lipoid disorders: Secondary | ICD-10-CM

## 2015-08-28 DIAGNOSIS — I10 Essential (primary) hypertension: Secondary | ICD-10-CM

## 2015-08-28 DIAGNOSIS — F411 Generalized anxiety disorder: Secondary | ICD-10-CM

## 2015-08-28 DIAGNOSIS — Z79899 Other long term (current) drug therapy: Secondary | ICD-10-CM

## 2015-08-28 HISTORY — DX: Essential (primary) hypertension: I10

## 2015-08-28 MED ORDER — LISINOPRIL 20 MG PO TABS: 20.0000 mg | ORAL_TABLET | Freq: Every day | ORAL | Status: DC

## 2015-08-28 MED ORDER — LORAZEPAM 0.5 MG PO TABS: 0.5000 mg | ORAL_TABLET | Freq: Three times a day (TID) | ORAL | Status: DC | PRN

## 2015-08-28 NOTE — Progress Notes (Signed)
   Subjective:    Patient ID: Ana King, female    DOB: [birth year], 65 y.o.   MRN: [id number]  HPI Pt presents to the clinic for blood pressure check. Her daughter has been checking her bp's and they are running 150's/90's. She denies any CP or palpitations. When her BP's are elevated she does feel a little dizzy and has a headache.   She continues to have anxiety but has not started zoloft and never got ativan.    Review of Systems  All other systems reviewed and are negative.      Objective:   Physical Exam  Constitutional: She is oriented to person, place, and time. She appears well-developed and well-nourished.  HENT:  Head: Normocephalic and atraumatic.  Cardiovascular: Normal rate, regular rhythm and normal heart sounds.   Pulmonary/Chest: Effort normal and breath sounds normal.  Neurological: She is alert and oriented to person, place, and time.  Psychiatric: She has a normal mood and affect. Her behavior is normal.          Assessment & Plan:  HTN- started lisinopril  daily. Discussed side effects. Follow up in one month for recheck. Continue to monitor at home. Goal under 140/90. cmp to be checked in one month.  Never got ativan for as needed anxiety. Reprinted today. Encouraged to start zoloft has not started yet.   Ordered lipid to check for screening purposes.

## 2015-08-29 ENCOUNTER — Encounter: Payer: Self-pay | Admitting: Physician Assistant

## 2015-08-29 ENCOUNTER — Other Ambulatory Visit: Payer: Self-pay | Admitting: *Deleted

## 2015-08-29 MED ORDER — BUDESONIDE-FORMOTEROL FUMARATE 160-4.5 MCG/ACT IN AERO: 2.0000 | INHALATION_SPRAY | Freq: Two times a day (BID) | RESPIRATORY_TRACT | Status: DC

## 2015-11-12 ENCOUNTER — Encounter: Payer: Self-pay | Admitting: Physician Assistant

## 2015-11-12 ENCOUNTER — Ambulatory Visit (INDEPENDENT_AMBULATORY_CARE_PROVIDER_SITE_OTHER): Payer: Self-pay

## 2015-11-12 ENCOUNTER — Ambulatory Visit (INDEPENDENT_AMBULATORY_CARE_PROVIDER_SITE_OTHER): Payer: Self-pay | Admitting: Physician Assistant

## 2015-11-12 VITALS — BP 131/66 | HR 86 | Ht 64.0 in | Wt 200.0 lb

## 2015-11-12 DIAGNOSIS — W19XXXA Unspecified fall, initial encounter: Secondary | ICD-10-CM

## 2015-11-12 DIAGNOSIS — T148XXA Other injury of unspecified body region, initial encounter: Secondary | ICD-10-CM

## 2015-11-12 DIAGNOSIS — M25521 Pain in right elbow: Secondary | ICD-10-CM

## 2015-11-12 DIAGNOSIS — M79631 Pain in right forearm: Secondary | ICD-10-CM

## 2015-11-12 DIAGNOSIS — T148 Other injury of unspecified body region: Secondary | ICD-10-CM

## 2015-11-12 MED ORDER — IBUPROFEN 800 MG PO TABS: 800.0000 mg | ORAL_TABLET | Freq: Three times a day (TID) | ORAL | Status: DC | PRN

## 2015-11-12 NOTE — Progress Notes (Signed)
   Subjective:    Patient ID: Ana King, female    DOB: 09/08/1950, 65 y.o.   MRN: [id number]  HPI  Pt is a 65 yo female who presents to the clinic after fall 1 week ago. 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 Her niece pulled down on the patient and made her fall landing on her right elbow on tile floors. Her right forearm is tender and sore. She has not done anything to make better. She can still use right arm and bend without difficultly. She wants to make sure not fractured.   Review of Systems    see HPI. Objective:   Physical Exam  Constitutional: She appears well-developed and well-nourished.  Musculoskeletal:  Normal ROM of right elbow.  No tenderness over bony prominences of right elbow.  Tenderness just below elbow over lateral upper forearm.  Greenish bruises and slight swelling over tenderness.  Strength of right upper extremity 5/5. Hand grip 5/5.            Assessment & Plan:  Fall/contusion- STAT xray confirm no fracture. Discussed contusion care. Ice, NSAIDs, rest. Should be improving over time. Follow up as needed.

## 2015-11-12 NOTE — Patient Instructions (Signed)

## 2015-11-13 DIAGNOSIS — T148XXA Other injury of unspecified body region, initial encounter: Secondary | ICD-10-CM | POA: Insufficient documentation

## 2016-02-19 ENCOUNTER — Encounter: Payer: Self-pay | Admitting: Physician Assistant

## 2016-02-19 NOTE — Progress Notes (Signed)
This encounter was created in error - please disregard.

## 2016-02-21 ENCOUNTER — Other Ambulatory Visit: Payer: Self-pay | Admitting: Physician Assistant

## 2016-02-25 ENCOUNTER — Ambulatory Visit (INDEPENDENT_AMBULATORY_CARE_PROVIDER_SITE_OTHER): Payer: Self-pay | Admitting: Family Medicine

## 2016-02-25 ENCOUNTER — Encounter: Payer: Self-pay | Admitting: Family Medicine

## 2016-02-25 VITALS — BP 118/74 | HR 91 | Wt 202.0 lb

## 2016-02-25 DIAGNOSIS — J42 Unspecified chronic bronchitis: Secondary | ICD-10-CM

## 2016-02-25 DIAGNOSIS — J441 Chronic obstructive pulmonary disease with (acute) exacerbation: Secondary | ICD-10-CM

## 2016-02-25 MED ORDER — PREDNISONE 20 MG PO TABS: ORAL_TABLET | ORAL | 0 refills | Status: AC

## 2016-02-25 MED ORDER — AZITHROMYCIN 250 MG PO TABS: ORAL_TABLET | ORAL | 0 refills | Status: AC

## 2016-02-25 NOTE — Progress Notes (Signed)
CC: Ana RhymesDeborah King is a 65 y.o. female is here for Shortness of Breath   Subjective: HPI:  On Friday morning she woke up with a sensation of shortness of breath. Persistent ever since onset except for for an hour after using albuterol nebulizer. It's worse with activity or in hot environments. It has not been interfering with her sleep. She continues to take Spiriva and Symbicort. She's had a nonproductive cough and some nasal congestion since this started. She denies any chest discomfort or blood in sputum. She denies any history of a blood clot nor any recent appendage swelling. Denies fevers, chills, nor wheezing at the present time but did have wheezing on Friday. Review Of Systems Outlined In HPI  Past Medical History:  Diagnosis Date  . Essential hypertension, benign 08/28/2015  . GERD (gastroesophageal reflux disease)     Past Surgical History:  Procedure Laterality Date  . ABDOMINAL HYSTERECTOMY     Family History  Problem Relation Age of Onset  . Cancer Mother   . Diabetes Maternal Aunt   . Hypertension Maternal Aunt   . Cancer Maternal Grandmother   . Diabetes Maternal Grandmother     Social History   Social History  . Marital status: Married    Spouse name: N/A  . Number of children: N/A  . Years of education: N/A   Occupational History  . Not on file.   Social History Main Topics  . Smoking status: Current Every Day Smoker    Start date: 05/15/2014  . Smokeless tobacco: Not on file  . Alcohol use No  . Drug use: Unknown  . Sexual activity: No   Other Topics Concern  . Not on file   Social History Narrative  . No narrative on file     Objective: BP 118/74   Pulse 91   Wt 202 lb (91.6 kg)   SpO2 97%   BMI 34.67 kg/m   General: Alert and Oriented, No Acute Distress HEENT: Pupils equal, round, reactive to light. Conjunctivae clear.  External ears unremarkable, canals clear with intact TMs with appropriate landmarks.  Middle ear appears open without  effusion. Pink inferior turbinates.  Moist mucous membranes, pharynx without inflammation nor lesions.  Neck supple without palpable lymphadenopathy nor abnormal masses. Lungs: Clear to auscultation bilaterally, no wheezing/ronchi/rales.  Comfortable work of breathing. Good air movement. Cardiac: Regular rate and rhythm. Normal S1/S2.  No murmurs, rubs, nor gallops.   Extremities: No peripheral edema.  Strong peripheral pulses.  Mental Status: No depression, anxiety, nor agitation. Skin: Warm and dry.  Assessment & Plan: Ana King was seen today for shortness of breath.  Diagnoses and all orders for this visit:  Chronic bronchitis, unspecified chronic bronchitis type (HCC)  COPD exacerbation (HCC) -     azithromycin (ZITHROMAX) 250 MG tablet; Take two tabs at once on day 1, then one tab daily on days 2-5. -     predniSONE (DELTASONE) 20 MG tablet; Three tabs at once daily for five days.  COPD exacerbation: Start prednisone and azithromycin regimen, continue albuterol on an as-needed basis. If any worsening or if no better by Wednesday please call for a stat CTA of the chest.Signs and symptoms requring emergent/urgent reevaluation were discussed with the patient.   Return if symptoms worsen or fail to improve.

## 2016-03-28 ENCOUNTER — Other Ambulatory Visit: Payer: Self-pay | Admitting: *Deleted

## 2016-03-28 MED ORDER — TIOTROPIUM BROMIDE MONOHYDRATE 2.5 MCG/ACT IN AERS: INHALATION_SPRAY | RESPIRATORY_TRACT | 11 refills | Status: DC

## 2016-04-16 ENCOUNTER — Telehealth: Payer: Self-pay | Admitting: Physician Assistant

## 2016-04-16 ENCOUNTER — Other Ambulatory Visit: Payer: Self-pay | Admitting: Physician Assistant

## 2016-04-16 MED ORDER — PREDNISONE 50 MG PO TABS: ORAL_TABLET | ORAL | 0 refills | Status: DC

## 2016-04-16 NOTE — Telephone Encounter (Signed)
Pt advised.

## 2016-04-16 NOTE — Telephone Encounter (Signed)
I went ahead and sent prednisone 50mg  burst for 5 days.

## 2016-04-16 NOTE — Telephone Encounter (Signed)
Pt reports some shortness of breath after missing a couple doses of her inhaler. Will route to PCP for review.

## 2016-04-16 NOTE — Progress Notes (Signed)
Pt has hx of COPD exacerbations. She has been out of her spiriva for 1 week. She just got Spiriva back in the mail. Will send over prednisone burst for 5 days. Tandy GawJade Breeback PA-C

## 2016-05-13 ENCOUNTER — Ambulatory Visit (INDEPENDENT_AMBULATORY_CARE_PROVIDER_SITE_OTHER): Payer: Self-pay | Admitting: Physician Assistant

## 2016-05-13 DIAGNOSIS — Z23 Encounter for immunization: Secondary | ICD-10-CM

## 2016-06-16 ENCOUNTER — Ambulatory Visit (INDEPENDENT_AMBULATORY_CARE_PROVIDER_SITE_OTHER): Payer: Self-pay | Admitting: Physician Assistant

## 2016-06-16 ENCOUNTER — Encounter: Payer: Self-pay | Admitting: Physician Assistant

## 2016-06-16 VITALS — BP 144/62 | HR 91 | Ht 64.0 in | Wt 204.0 lb

## 2016-06-16 DIAGNOSIS — I10 Essential (primary) hypertension: Secondary | ICD-10-CM

## 2016-06-16 DIAGNOSIS — N644 Mastodynia: Secondary | ICD-10-CM

## 2016-06-16 DIAGNOSIS — S29011A Strain of muscle and tendon of front wall of thorax, initial encounter: Secondary | ICD-10-CM

## 2016-06-16 MED ORDER — IBUPROFEN 800 MG PO TABS: 800.0000 mg | ORAL_TABLET | Freq: Three times a day (TID) | ORAL | 2 refills | Status: DC | PRN

## 2016-06-16 NOTE — Progress Notes (Signed)
   Subjective:    Patient ID: Ana King, female    DOB: [birth year], 65 y.o.   MRN: [id number]  HPI  Pt is a 65 yo female who presents to the clinic with left sided chest wall pain that started on thanksgiving day, 4 days ago. Pt denies any coughing, fever, body aches. 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 Pain is not constant but when occurs rates about 5/10 and dull. Seems to be worse with lifing. No CP, arm pain, jaw pain, pain with exertion. She has been checking BP and more elevated for the last 4 days. She has never had mammogram. No nipple discharge, rash, redness, breast masses palpated.   Review of Systems  All other systems reviewed and are negative.      Objective:   Physical Exam  Constitutional: She is oriented to person, place, and time. She appears well-developed and well-nourished.  HENT:  Head: Normocephalic and atraumatic.  Cardiovascular: Normal rate, regular rhythm and normal heart sounds.   Pulmonary/Chest: Effort normal and breath sounds normal.  Tenderness to palpation over left chest wall.  No breast masses.  No nipple discharge or inversion.  No skin rash.   Neurological: She is alert and oriented to person, place, and time.  Psychiatric: She has a normal mood and affect. Her behavior is normal.          Assessment & Plan:  [name].[name].Ana King was seen today for hypertension and left breast pain.  Diagnoses and all orders for this visit:  Pectoralis muscle strain, initial encounter -     ibuprofen (ADVIL,MOTRIN) 800 MG tablet; Take 1 tablet (800 mg total) by mouth every 8 (eight) hours as needed.  Breast pain in female -     ibuprofen (ADVIL,MOTRIN) 800 MG tablet; Take 1 tablet (800 mg total) by mouth every 8 (eight) hours as needed.  Essential hypertension, benign   I do think symptoms represent pectoralis muscle strain. Continue on ibuprofen 2-3 times a day for next 5 days. ICE  and biofreeze area. Rest. If not improving in next 5 days will consider further work up. Exercise given to do gentle exercises.   I do not think breast etiology but if pain continues would like to order diagnostic breast exam. Either way patient encouraged to get screening breast exam as she has not had one.   Continue to monitor BP. 2nd recheck went down a lot. If continues to but above 140/90 will consider increasing lisinopril.

## 2016-06-16 NOTE — Patient Instructions (Signed)
Ibuprofen, ice, rest for next 5 days.  Increase lisinopril to 40mg  daily and continue to check BP.   Muscle Strain A muscle strain (pulled muscle) happens when a muscle is stretched beyond normal length. It happens when a sudden, violent force stretches your muscle too far. Usually, a few of the fibers in your muscle are torn. Muscle strain is common in athletes. Recovery usually takes 1-2 weeks. Complete healing takes 5-6 weeks. Follow these instructions at home:  Follow the PRICE method of treatment to help your injury get better. Do this the first 2-3 days after the injury:  Protect. Protect the muscle to keep it from getting injured again.  Rest. Limit your activity and rest the injured body part.  Ice. Put ice in a plastic bag. Place a towel between your skin and the bag. Then, apply the ice and leave it on from 15-20 minutes each hour. After the third day, switch to moist heat packs.  Compression. Use a splint or elastic bandage on the injured area for comfort. Do not put it on too tightly.  Elevate. Keep the injured body part above the level of your heart.  Only take medicine as told by your doctor.  Warm up before doing exercise to prevent future muscle strains. Contact a doctor if:  You have more pain or puffiness (swelling) in the injured area.  You feel numbness, tingling, or notice a loss of strength in the injured area. This information is not intended to replace advice given to you by your health care provider. Make sure you discuss any questions you have with your health care provider. Document Released: 04/15/2008 Document Revised: 12/13/2015 Document Reviewed: 02/03/2013 Elsevier Interactive Patient Education  2017 [employer]

## 2016-07-04 ENCOUNTER — Other Ambulatory Visit: Payer: Self-pay | Admitting: Physician Assistant

## 2016-07-04 MED ORDER — PREDNISONE 50 MG PO TABS: ORAL_TABLET | ORAL | 0 refills | Status: DC

## 2016-07-04 NOTE — Progress Notes (Signed)
Pt called with in URI symptoms for last 3 days. She has COPD. She is using daily and preventive inhalers. Her SOB/wheezing is worsening. Will send prednisone for 5 days. Follow up on Monday. Discussed symptomatic care.

## 2016-07-04 NOTE — Addendum Note (Signed)
Addended by: Chalmers CaterUTTLE, Avo Schlachter H on: 07/04/2016 10:55 AM   Modules accepted: Orders

## 2016-07-07 ENCOUNTER — Ambulatory Visit: Payer: Self-pay | Admitting: Physician Assistant

## 2016-07-22 ENCOUNTER — Ambulatory Visit: Payer: Self-pay | Admitting: Physician Assistant

## 2016-07-28 ENCOUNTER — Encounter: Payer: Self-pay | Admitting: Physician Assistant

## 2016-07-28 ENCOUNTER — Ambulatory Visit (INDEPENDENT_AMBULATORY_CARE_PROVIDER_SITE_OTHER): Payer: Self-pay | Admitting: Physician Assistant

## 2016-07-28 VITALS — BP 128/82 | HR 106 | Ht 64.0 in | Wt 206.0 lb

## 2016-07-28 DIAGNOSIS — R059 Cough, unspecified: Secondary | ICD-10-CM

## 2016-07-28 DIAGNOSIS — J42 Unspecified chronic bronchitis: Secondary | ICD-10-CM

## 2016-07-28 DIAGNOSIS — M4000 Postural kyphosis, site unspecified: Secondary | ICD-10-CM | POA: Insufficient documentation

## 2016-07-28 DIAGNOSIS — R05 Cough: Secondary | ICD-10-CM

## 2016-07-28 DIAGNOSIS — R0982 Postnasal drip: Secondary | ICD-10-CM

## 2016-07-28 MED ORDER — PREDNISONE 50 MG PO TABS: ORAL_TABLET | ORAL | 0 refills | Status: DC

## 2016-07-28 NOTE — Progress Notes (Signed)
   Subjective:    Patient ID: Ana King, female    DOB: 1951-03-01, 66 y.o.   MRN: [id number]  HPI  Pt is a 66 yo female with COPD who presents to the clinic with productive cough and "feels like something is in her throat".   She is coughing up green sputum. No fever, chills, sinus pressure, ear pain, or ST. She is taking symbicort and sprivia. She had prednisone 12/15 and seemed to help symptoms.   She is checking BP at home and 130's over 70's.   She has noticed a hump on posterior neck. At times it becomes achy. Ibuprofen and icy hot help with pain. Noticed for last 2 months. She does do a lot of cross word puzzles and coloring during the day.     Review of Systems See HPI.     Objective:   Physical Exam  Constitutional: She is oriented to person, place, and time. She appears well-developed and well-nourished.  HENT:  Head: Normocephalic and atraumatic.  Right Ear: External ear normal.  Left Ear: External ear normal.  TM"s clear bilaterally.  Oropharynx erythematous with PND.  Negative for tenderness over maxillary or frontal sinuses.   Eyes: Conjunctivae are normal. Right eye exhibits no discharge. Left eye exhibits no discharge.  Neck: Normal range of motion. Neck supple.  Kyphosis of neck. Non-tender.   Cardiovascular: Normal rate, regular rhythm and normal heart sounds.   Pulmonary/Chest: Effort normal and breath sounds normal. She has no wheezes.  Lymphadenopathy:    She has no cervical adenopathy.  Neurological: She is alert and oriented to person, place, and time.  Psychiatric: She has a normal mood and affect. Her behavior is normal.          Assessment & Plan:  [name].[name].Diagnoses and all orders for this visit:  PND (post-nasal drip)  Postural kyphosis, unspecified spinal region  Cough  Chronic bronchitis, unspecified chronic bronchitis type (HCC)  Other orders -     predniSONE (DELTASONE) 50 MG tablet; Take one tablet for 5 days.   I did not see  any signs of infection. Discussed mucinex 600mg  bid drinking lots of water. If worsening or SOB/Wheezing start prednisone.   Reassured pt of kyphosis. Continues with symptomatic care. Work on posture.

## 2016-07-28 NOTE — Patient Instructions (Signed)
Work on posture.  Start mucines 600mg  daily. With lots of water.  Start prednisone if needed.  Call with worsening symptoms.

## 2016-08-19 ENCOUNTER — Other Ambulatory Visit: Payer: Self-pay | Admitting: *Deleted

## 2016-08-19 MED ORDER — LISINOPRIL 20 MG PO TABS: 20.0000 mg | ORAL_TABLET | Freq: Every day | ORAL | 1 refills | Status: DC

## 2016-08-21 ENCOUNTER — Other Ambulatory Visit: Payer: Self-pay | Admitting: Sports Medicine

## 2016-08-21 ENCOUNTER — Other Ambulatory Visit: Payer: Self-pay | Admitting: *Deleted

## 2016-08-21 MED ORDER — LISINOPRIL 20 MG PO TABS: 20.0000 mg | ORAL_TABLET | Freq: Every day | ORAL | 1 refills | Status: DC

## 2016-08-22 ENCOUNTER — Other Ambulatory Visit: Payer: Self-pay | Admitting: *Deleted

## 2016-08-22 MED ORDER — BUDESONIDE-FORMOTEROL FUMARATE 160-4.5 MCG/ACT IN AERO: 2.0000 | INHALATION_SPRAY | Freq: Two times a day (BID) | RESPIRATORY_TRACT | 12 refills | Status: DC

## 2016-11-12 ENCOUNTER — Other Ambulatory Visit: Payer: Self-pay | Admitting: *Deleted

## 2016-11-12 MED ORDER — TIOTROPIUM BROMIDE MONOHYDRATE 2.5 MCG/ACT IN AERS: INHALATION_SPRAY | RESPIRATORY_TRACT | 11 refills | Status: DC

## 2016-12-22 ENCOUNTER — Other Ambulatory Visit: Payer: Self-pay | Admitting: *Deleted

## 2016-12-22 ENCOUNTER — Telehealth: Payer: Self-pay | Admitting: Physician Assistant

## 2016-12-22 DIAGNOSIS — S29011A Strain of muscle and tendon of front wall of thorax, initial encounter: Secondary | ICD-10-CM

## 2016-12-22 DIAGNOSIS — N644 Mastodynia: Secondary | ICD-10-CM

## 2016-12-22 MED ORDER — IBUPROFEN 800 MG PO TABS: 800.0000 mg | ORAL_TABLET | Freq: Three times a day (TID) | ORAL | 2 refills | Status: DC | PRN

## 2016-12-22 NOTE — Telephone Encounter (Signed)
Patient called asking if she could have a refill on the ibuprofen. - CF

## 2016-12-22 NOTE — Telephone Encounter (Signed)
Refill sent.

## 2017-02-20 ENCOUNTER — Other Ambulatory Visit: Payer: Self-pay

## 2017-02-20 MED ORDER — LISINOPRIL 20 MG PO TABS: 20.0000 mg | ORAL_TABLET | Freq: Every day | ORAL | 1 refills | Status: DC

## 2017-02-23 ENCOUNTER — Encounter: Payer: Self-pay | Admitting: Family Medicine

## 2017-02-23 ENCOUNTER — Ambulatory Visit (INDEPENDENT_AMBULATORY_CARE_PROVIDER_SITE_OTHER): Payer: Medicare Other | Admitting: Family Medicine

## 2017-02-23 VITALS — BP 136/58 | HR 90 | Wt 207.0 lb

## 2017-02-23 DIAGNOSIS — J42 Unspecified chronic bronchitis: Secondary | ICD-10-CM

## 2017-02-23 DIAGNOSIS — R0602 Shortness of breath: Secondary | ICD-10-CM

## 2017-02-23 MED ORDER — PREDNISONE 20 MG PO TABS: 40.0000 mg | ORAL_TABLET | Freq: Every day | ORAL | 0 refills | Status: DC

## 2017-02-23 NOTE — Progress Notes (Signed)
Subjective:    Patient ID: Ana King, female    DOB: 02/14/1951, 66 y.o.   MRN: [id number]  HPI 66 year old female with a history of COPD currently on Spiriva and Symbicort daily comes in today complaining of feeling like she couldn't take a full deep breath that started on Saturday.  It lasted through the evening and then into the next morning. In fact she felt bad enough that she didn't go to church on Sunday morning. Later that day she got out of the house and actually started to feel a little bit better. She denies any recent chest pain, heart palpitations or swelling of the extremities. She has had some soreness in both of her upper outer shoulders over the last couple of weeks. She's also had some intermittent dizziness but says she was having this intermittently before the difficulty with breathing. She denies any cough, wheezing or other upper respiratory symptoms. She does sometimes have allergies this time year. She says that she is actually feeling a lot better today that her symptoms are not completely resolved. No prior history of pulmonary embolism or DVT.   Review of Systems  BP (!) 136/58   Pulse 90   Wt 207 lb (93.9 kg)   SpO2 100%   PF 360 L/min Comment: green  BMI 35.53 kg/m     Allergies  Allergen Reactions  . Penicillins     Past Medical History:  Diagnosis Date  . Essential hypertension, benign 08/28/2015  . GERD (gastroesophageal reflux disease)     Past Surgical History:  Procedure Laterality Date  . ABDOMINAL HYSTERECTOMY      Social History   Social History  . Marital status: Married    Spouse name: N/A  . Number of children: N/A  . Years of education: N/A   Occupational History  . Not on file.   Social History Main Topics  . Smoking status: Current Every Day Smoker    Start date: 05/15/2014  . Smokeless tobacco: Never Used  . Alcohol use No  . Drug use: Unknown  . Sexual activity: No   Other Topics Concern  . Not on file   Social  History Narrative  . No narrative on file    Family History  Problem Relation Age of Onset  . Cancer Mother   . Diabetes Maternal Aunt   . Hypertension Maternal Aunt   . Cancer Maternal Grandmother   . Diabetes Maternal Grandmother     Outpatient Encounter Prescriptions as of 02/23/2017  Medication Sig  . budesonide-formoterol (SYMBICORT) 160-4.5 MCG/ACT inhaler Inhale 2 puffs into the lungs 2 (two) times daily.  [name] ibuprofen (ADVIL,MOTRIN) 800 MG tablet Take 1 tablet (800 mg total) by mouth every 8 (eight) hours as needed.  . Lansoprazole (PREVACID PO) Take 20 mg by mouth 2 (two) times daily.  [name] lisinopril (PRINIVIL,ZESTRIL) 20 MG tablet Take 1 tablet (20 mg total) by mouth daily.  . Tiotropium Bromide Monohydrate (SPIRIVA RESPIMAT) 2.5 MCG/ACT AERS 2 puffs once a day.  . predniSONE (DELTASONE) 20 MG tablet Take 2 tablets (40 mg total) by mouth daily.  . [DISCONTINUED] albuterol (PROVENTIL) (2.5 MG/3ML) 0.083% nebulizer solution inhale contents of 1 vial in nebulizer every 4 hours if needed for SHORTNESS OF BREATH  . [DISCONTINUED] predniSONE (DELTASONE) 50 MG tablet Take one tablet for 5 days.   No facility-administered encounter medications on file as of 02/23/2017.           Objective:   Physical Exam  Constitutional:  She is oriented to person, place, and time. She appears well-developed and well-nourished.  HENT:  Head: Normocephalic and atraumatic.  Right Ear: External ear normal.  Left Ear: External ear normal.  Nose: Nose normal.  Mouth/Throat: Oropharynx is clear and moist.  Cerumen blocking canals bilaterally.   Eyes: Pupils are equal, round, and reactive to light. Conjunctivae and EOM are normal.  Neck: Neck supple. No thyromegaly present.  Cardiovascular: Normal rate, regular rhythm and normal heart sounds.   Pulmonary/Chest: Effort normal and breath sounds normal. She has no wheezes.  Lymphadenopathy:    She has no cervical adenopathy.  Neurological: She is  alert and oriented to person, place, and time.  Skin: Skin is warm and dry.  Psychiatric: She has a normal mood and affect.          Assessment & Plan:  SOB with COPD- Unclear etiology. No cough or sputum production.  She is feeling better overall but still a little symptomatic. Certainly could've been a COPD exacerbation though mild. I'm going to go ahead and give her perception for prednisone to fill only if she feels like she's not continuing to improve or she suddenly feels symptomatic again. No arrhythmia on exam today. No other signs or symptoms concerning for pulmonary embolism. Certainly allergies could be a component. No prior history or acute signs of heart failure.

## 2017-03-24 ENCOUNTER — Ambulatory Visit (INDEPENDENT_AMBULATORY_CARE_PROVIDER_SITE_OTHER): Payer: Medicare Other | Admitting: Physician Assistant

## 2017-03-24 ENCOUNTER — Encounter: Payer: Self-pay | Admitting: Physician Assistant

## 2017-03-24 VITALS — BP 137/76 | HR 79 | Temp 98.8°F | Wt 211.0 lb

## 2017-03-24 DIAGNOSIS — R252 Cramp and spasm: Secondary | ICD-10-CM

## 2017-03-24 DIAGNOSIS — Z13220 Encounter for screening for lipoid disorders: Secondary | ICD-10-CM | POA: Diagnosis not present

## 2017-03-24 DIAGNOSIS — J42 Unspecified chronic bronchitis: Secondary | ICD-10-CM | POA: Diagnosis not present

## 2017-03-24 DIAGNOSIS — Z131 Encounter for screening for diabetes mellitus: Secondary | ICD-10-CM | POA: Diagnosis not present

## 2017-03-24 DIAGNOSIS — Z23 Encounter for immunization: Secondary | ICD-10-CM

## 2017-03-24 NOTE — Progress Notes (Signed)
   Subjective:    Patient ID: Ana King, female    DOB: July 29, 1950, 66 y.o.   MRN: [id number]  HPI  Pt is a 66 yo female who presents to the clinic for COPD follow up.   She just got medicare and now ready to start becoming up to date on health maintenance.   On 8/6 she was seen for SOB with COPD she was given prednisone for exacerbation. Symptoms cleared and patient is no asymptomatic. She continues on spiriva and symbicort.   Pt is having a sharp occasional pain in left lower quadrant. Denies any injury. Denies any fever, chills, dysuria, or bowel changes. Not tried anything to make it better.   .. Active Ambulatory Problems    Diagnosis Date Noted  . Anxiety state 11/02/2013  . Tobacco abuse 11/02/2013  . COPD (chronic obstructive pulmonary disease) (HCC) 11/02/2013  . GERD (gastroesophageal reflux disease) 11/02/2013  . Hiatal hernia 12/09/2013  . Essential hypertension, benign 08/28/2015  . Contusion 11/13/2015  . 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 No Resolved Ambulatory Problems   Past Medical History:  Diagnosis Date  . Essential hypertension, benign 08/28/2015  . GERD (gastroesophageal reflux disease)                             Review of Systems  All other systems reviewed and are negative.      Objective:   Physical Exam  Constitutional: She is oriented to person, place, and time. She appears well-developed and well-nourished.  HENT:  Head: Normocephalic and atraumatic.  Cardiovascular: Normal rate, regular rhythm and normal heart sounds.   Pulmonary/Chest: Effort normal and breath sounds normal. She has no wheezes.  Abdominal: Soft. Bowel sounds are normal. She exhibits no distension and no mass. There is no tenderness. There is no rebound and no guarding.  Neurological: She is alert and oriented to person, place, and time.  Psychiatric: She has a normal mood and affect. Her behavior is normal.           Assessment & Plan:  [name].[name].Ana King was seen today for f/u copd.  Diagnoses and all orders for this visit:  Chronic bronchitis, unspecified chronic bronchitis type (HCC)  Screening for lipid disorders -     Lipid panel  Screening for diabetes mellitus -     COMPLETE METABOLIC PANEL WITH GFR  Muscle cramps -     Magnesium -     CBC with Differential/Platelet  Need for immunization against influenza -     Flu Vaccine QUAD 36+ mos IM  Need for vaccination against Streptococcus pneumoniae using pneumococcal conjugate vaccine 13 -     Pneumococcal conjugate vaccine 13-valent  reassurance given that lungs sound great today.   Encouraged patient to come back for medicare wellness visit.  Flu and prevnar given today.   Pt now has insurance and I suggest we get a spirometry to evaluate lung function.   I suspect occasional sharp pain are muscle spasm. Will check CBC and magnesium. Follow up as needed.

## 2017-03-30 DIAGNOSIS — Z13220 Encounter for screening for lipoid disorders: Secondary | ICD-10-CM | POA: Diagnosis not present

## 2017-03-30 DIAGNOSIS — Z131 Encounter for screening for diabetes mellitus: Secondary | ICD-10-CM | POA: Diagnosis not present

## 2017-03-30 DIAGNOSIS — E7800 Pure hypercholesterolemia, unspecified: Secondary | ICD-10-CM | POA: Diagnosis not present

## 2017-03-30 DIAGNOSIS — R252 Cramp and spasm: Secondary | ICD-10-CM | POA: Diagnosis not present

## 2017-03-30 DIAGNOSIS — R7309 Other abnormal glucose: Secondary | ICD-10-CM | POA: Diagnosis not present

## 2017-03-30 DIAGNOSIS — Z1321 Encounter for screening for nutritional disorder: Secondary | ICD-10-CM | POA: Diagnosis not present

## 2017-03-30 DIAGNOSIS — R739 Hyperglycemia, unspecified: Secondary | ICD-10-CM | POA: Diagnosis not present

## 2017-03-30 DIAGNOSIS — I10 Essential (primary) hypertension: Secondary | ICD-10-CM | POA: Diagnosis not present

## 2017-04-03 LAB — COMPLETE METABOLIC PANEL WITH GFR
AG Ratio: 1.5 (calc) (ref 1.0–2.5)
ALT: 11 U/L (ref 6–29)
AST: 8 U/L — AB (ref 10–35)
Albumin: 4 g/dL (ref 3.6–5.1)
Alkaline phosphatase (APISO): 93 U/L (ref 33–130)
BUN: 11 mg/dL (ref 7–25)
CALCIUM: 9.3 mg/dL (ref 8.6–10.4)
CO2: 26 mmol/L (ref 20–32)
CREATININE: 0.78 mg/dL (ref 0.50–0.99)
Chloride: 108 mmol/L (ref 98–110)
GFR, EST NON AFRICAN AMERICAN: 80 mL/min/{1.73_m2} (ref >=60)
GFR, Est African American: 92 mL/min/{1.73_m2} (ref >=60)
GLOBULIN: 2.7 g/dL (ref 1.9–3.7)
Glucose, Bld: 102 mg/dL — ABNORMAL HIGH (ref 65–99)
Potassium: 4.6 mmol/L (ref 3.5–5.3)
Sodium: 142 mmol/L (ref 135–146)
TOTAL PROTEIN: 6.7 g/dL (ref 6.1–8.1)
Total Bilirubin: 0.4 mg/dL (ref 0.2–1.2)

## 2017-04-03 LAB — CBC WITH DIFFERENTIAL/PLATELET
BASOS PCT: 0.1 %
Basophils Absolute: 9 cells/uL (ref 0–200)
Eosinophils Absolute: 233 cells/uL (ref 15–500)
Eosinophils Relative: 2.5 %
HEMATOCRIT: 33.2 % — AB (ref 35.0–45.0)
HEMOGLOBIN: 11.1 g/dL — AB (ref 11.7–15.5)
LYMPHS ABS: 1823 {cells}/uL (ref 850–3900)
MCH: 27.8 pg (ref 27.0–33.0)
MCHC: 33.4 g/dL (ref 32.0–36.0)
MCV: 83.2 fL (ref 80.0–100.0)
MPV: 10.9 fL (ref 7.5–12.5)
Monocytes Relative: 5 %
NEUTROS ABS: 6770 {cells}/uL (ref 1500–7800)
Neutrophils Relative %: 72.8 %
PLATELETS: 367 10*3/uL (ref 140–400)
RBC: 3.99 10*6/uL (ref 3.80–5.10)
RDW: 13.3 % (ref 11.0–15.0)
Total Lymphocyte: 19.6 %
WBC: 9.3 10*3/uL (ref 3.8–10.8)
WBCMIX: 465 {cells}/uL (ref 200–950)

## 2017-04-03 LAB — TEST AUTHORIZATION

## 2017-04-03 LAB — LIPID PANEL
CHOL/HDL RATIO: 3.6 (calc) (ref <5.0)
CHOLESTEROL: 196 mg/dL (ref <200)
HDL: 54 mg/dL (ref >50)
LDL Cholesterol (Calc): 121 mg/dL (calc) — ABNORMAL HIGH
Non-HDL Cholesterol (Calc): 142 mg/dL (calc) — ABNORMAL HIGH (ref <130)
Triglycerides: 99 mg/dL (ref <150)

## 2017-04-03 LAB — HEMOGLOBIN A1C W/OUT EAG: Hgb A1c MFr Bld: 5.2 % of total Hgb (ref <5.7)

## 2017-04-03 LAB — MAGNESIUM: Magnesium: 2.1 mg/dL (ref 1.5–2.5)

## 2017-05-18 ENCOUNTER — Other Ambulatory Visit: Payer: Self-pay | Admitting: Physician Assistant

## 2017-05-18 DIAGNOSIS — N644 Mastodynia: Secondary | ICD-10-CM

## 2017-05-18 DIAGNOSIS — S29011A Strain of muscle and tendon of front wall of thorax, initial encounter: Secondary | ICD-10-CM

## 2017-05-18 MED ORDER — IBUPROFEN 800 MG PO TABS: 800.0000 mg | ORAL_TABLET | Freq: Three times a day (TID) | ORAL | 2 refills | Status: DC | PRN

## 2017-06-08 ENCOUNTER — Ambulatory Visit (INDEPENDENT_AMBULATORY_CARE_PROVIDER_SITE_OTHER): Payer: Medicare Other | Admitting: Physician Assistant

## 2017-06-08 ENCOUNTER — Encounter: Payer: Self-pay | Admitting: Physician Assistant

## 2017-06-08 VITALS — BP 167/67 | HR 81 | Wt 213.0 lb

## 2017-06-08 DIAGNOSIS — Z87891 Personal history of nicotine dependence: Secondary | ICD-10-CM

## 2017-06-08 DIAGNOSIS — J4521 Mild intermittent asthma with (acute) exacerbation: Secondary | ICD-10-CM

## 2017-06-08 MED ORDER — ALBUTEROL SULFATE (2.5 MG/3ML) 0.083% IN NEBU
2.5000 mg | INHALATION_SOLUTION | Freq: Once | RESPIRATORY_TRACT | Status: AC
  Administered 2017-06-08: 2.5 mg via RESPIRATORY_TRACT

## 2017-06-08 MED ORDER — MONTELUKAST SODIUM 10 MG PO TABS: 10.0000 mg | ORAL_TABLET | Freq: Every day | ORAL | 5 refills | Status: DC

## 2017-06-08 MED ORDER — PREDNISONE 50 MG PO TABS: ORAL_TABLET | ORAL | 0 refills | Status: DC

## 2017-06-08 NOTE — Progress Notes (Signed)
   Subjective:    Patient ID: Ana King, female    DOB: 12/29/1950, 66 y.o.   MRN: [id number]  HPI Pt is a 66 yo pleasant female with a 40 plus year smoking history but has quit for last 3 years. She comes in for spirometry.  .. Active Ambulatory Problems    Diagnosis Date Noted  . Anxiety state 11/02/2013  . Tobacco abuse 11/02/2013  . GERD (gastroesophageal reflux disease) 11/02/2013  . Hiatal hernia 12/09/2013  . Essential hypertension, benign 08/28/2015  . Contusion 11/13/2015  . 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 COPD (chronic obstructive pulmonary disease) (HCC) 11/02/2013   Past Medical History:  Diagnosis Date  . Asthma in adult, mild intermittent, with acute exacerbation 06/08/2017  . Essential hypertension, benign 08/28/2015  . GERD (gastroesophageal reflux disease)    .    Review of Systems  All other systems reviewed and are negative.      Objective:   Physical Exam  Constitutional: She is oriented to person, place, and time. She appears well-developed and well-nourished.  HENT:  Head: Normocephalic and atraumatic.  Right Ear: External ear normal.  Left Ear: External ear normal.  Nose: Nose normal.  Mouth/Throat: Oropharynx is clear and moist. No oropharyngeal exudate.  Eyes: Conjunctivae are normal.  Neck: Normal range of motion. Neck supple.  Cardiovascular: Normal rate, regular rhythm and normal heart sounds.  Pulmonary/Chest: Effort normal and breath sounds normal. She has no wheezes.  Lymphadenopathy:    She has no cervical adenopathy.  Neurological: She is alert and oriented to person, place, and time.  Psychiatric: She has a normal mood and affect. Her behavior is normal.          Assessment & Plan:  [name].[name].Gavin PoundDeborah was seen today for copd.  Diagnoses and all orders for this visit:  Mild intermittent reactive airway disease with acute exacerbation -     montelukast (SINGULAIR) 10 MG  tablet; Take 1 tablet (10 mg total) at bedtime by mouth. -     predniSONE (DELTASONE) 50 MG tablet; Take one tablet for 5 days. -     Spirometry: Pre & Post Eval -     albuterol (PROVENTIL) (2.5 MG/3ML) 0.083% nebulizer solution 2.5 mg -     Ambulatory Referral for Lung Cancer Scre  Former smoker -     Ambulatory Referral for Lung Cancer Scre  FEV1 is 74 percent, FVC/FEV1 ratio is above 70 percent , FEV1 percent change is 9. FEF25-75 percent change is 23.  Due to smoking hx made referral for lung cancer screening.  StOP spiriva due to results not showing COPD at this time.  There is evidence of significant small airway changes but not clinically significant FEV1 changes for asthma. Her lung age is 4084 and will likely progress into COPD but for now only continue on symbicort and as needed rescue/nebulizer treatment.  Added singulair daily.  Prednisone burst given today due to significant improvement with neb and c/o SOB and chest tightness.  Follow up as needed.

## 2017-06-09 ENCOUNTER — Encounter: Payer: Self-pay | Admitting: Physician Assistant

## 2017-06-09 DIAGNOSIS — J45909 Unspecified asthma, uncomplicated: Secondary | ICD-10-CM | POA: Insufficient documentation

## 2017-06-09 DIAGNOSIS — Z87891 Personal history of nicotine dependence: Secondary | ICD-10-CM | POA: Insufficient documentation

## 2017-06-19 ENCOUNTER — Other Ambulatory Visit: Payer: Self-pay

## 2017-06-19 ENCOUNTER — Telehealth: Payer: Self-pay

## 2017-06-19 MED ORDER — ALBUTEROL SULFATE (2.5 MG/3ML) 0.083% IN NEBU: 2.5000 mg | INHALATION_SOLUTION | Freq: Four times a day (QID) | RESPIRATORY_TRACT | 0 refills | Status: DC | PRN

## 2017-06-19 NOTE — Telephone Encounter (Signed)
Jade advised patient to use nebulizer solution as needed. There is not a history for nebulizer solution in Epic. She needs a refill. Please advise.

## 2017-06-19 NOTE — Telephone Encounter (Signed)
See telephone note.

## 2017-06-19 NOTE — Telephone Encounter (Signed)
Scription sent for albuterol nebulizer solution.

## 2017-06-22 NOTE — Telephone Encounter (Signed)
PATIENT HAS BEEN INFORMED. Ana King,CMA  

## 2017-08-11 ENCOUNTER — Encounter: Payer: Self-pay | Admitting: Physician Assistant

## 2017-08-11 ENCOUNTER — Ambulatory Visit (INDEPENDENT_AMBULATORY_CARE_PROVIDER_SITE_OTHER): Payer: Medicare HMO | Admitting: Physician Assistant

## 2017-08-11 VITALS — BP 159/74 | HR 93 | Ht 64.0 in | Wt 210.0 lb

## 2017-08-11 DIAGNOSIS — R42 Dizziness and giddiness: Secondary | ICD-10-CM | POA: Diagnosis not present

## 2017-08-11 DIAGNOSIS — R0789 Other chest pain: Secondary | ICD-10-CM | POA: Diagnosis not present

## 2017-08-11 DIAGNOSIS — M940 Chondrocostal junction syndrome [Tietze]: Secondary | ICD-10-CM | POA: Diagnosis not present

## 2017-08-11 DIAGNOSIS — I10 Essential (primary) hypertension: Secondary | ICD-10-CM

## 2017-08-11 DIAGNOSIS — J4521 Mild intermittent asthma with (acute) exacerbation: Secondary | ICD-10-CM

## 2017-08-11 MED ORDER — PREDNISONE 50 MG PO TABS: ORAL_TABLET | ORAL | 0 refills | Status: DC

## 2017-08-11 NOTE — Progress Notes (Signed)
   Subjective:    Patient ID: Ana King, female    DOB: [birth year], 67 y.o.   MRN: [id number]  HPI  Pt is a 67 yo female previous smoker with hx of reactive airway disease and HTN who presents to the clinic with her daughter to discuss chest pain that started last night after dinner. She describes pain starting in the center of the chest and radiating under breast around her left side but not into back. She does describe some mid shoulder pain as well. Denies any arm numbness. She did eat something spicy for dinner but takes prevacid twice a day. CP feels better this morning. Deep breathing and coughing make worse. She has been coughing a little more than usual. Denies any n/v/d.   She has also had some postitional changing vertigo for the last 2 days off and on. No ear pain, sinus pressure, ST, fever, chills. She has not tried anything to make better.    Review of Systems  All other systems reviewed and are negative.      Objective:   Physical Exam  Constitutional: She is oriented to person, place, and time. She appears well-developed and well-nourished.  HENT:  Head: Normocephalic and atraumatic.  Right Ear: External ear normal.  Left Ear: External ear normal.  Nose: Nose normal.  Mouth/Throat: Oropharynx is clear and moist. No oropharyngeal exudate.  Eyes: Conjunctivae are normal. Right eye exhibits no discharge. Left eye exhibits no discharge.  Neck: Normal range of motion. Neck supple.  Cardiovascular: Normal rate, regular rhythm and normal heart sounds.  Pulmonary/Chest: Effort normal and breath sounds normal. She exhibits tenderness.  Tenderness over sternum and left intercostal space.   Lymphadenopathy:    She has no cervical adenopathy.  Neurological: She is alert and oriented to person, place, and time.  No positional dizziness noted today.   Psychiatric: She has a normal mood and affect. Her behavior is normal.          Assessment & Plan:  [name].[name].Diagnoses and all  orders for this visit:  Atypical chest pain -     EKG 12-Lead  Vertigo  Costochondritis  Essential hypertension, benign -     lisinopril (PRINIVIL,ZESTRIL) 20 MG tablet; Take 1 tablet (20 mg total) by mouth daily.  Mild intermittent reactive airway disease with acute exacerbation -     predniSONE (DELTASONE) 50 MG tablet; Take one tablet for 5 days.   EKG compared to previous EKG was unchanged with no ST elevation or depression.   I do not think this is cardiac related. Sounds like costochondritis. HO given. Prednisone for 5 days.   BP elevated today. Refilled lisinopril. Discussed checking at home and reporting back to me readings. If continues to be elevated will make some medication adjustments. Follow up in 4 weeks.   Vertigo- presenting like BPPV. Discussed epley maneuvers and flonase. No signs of ear infection. Follow up if not resolving.   [name].Spent 30 minutes with patient and greater than 50 percent of visit spent counseling patient regarding treatment plan.

## 2017-08-11 NOTE — Patient Instructions (Signed)
Costochondritis Costochondritis is swelling and irritation (inflammation) of the tissue (cartilage) that connects your ribs to your breastbone (sternum). This causes pain in the front of your chest. Usually, the pain:  Starts gradually.  Is in more than one rib.  This condition usually goes away on its own over time. Follow these instructions at home:  Do not do anything that makes your pain worse.  If directed, put ice on the painful area: ? Put ice in a plastic bag. ? Place a towel between your skin and the bag. ? Leave the ice on for 20 minutes, 2-3 times a day.  If directed, put heat on the affected area as often as told by your doctor. Use the heat source that your doctor tells you to use, such as a moist heat pack or a heating pad. ? Place a towel between your skin and the heat source. ? Leave the heat on for 20-30 minutes. ? Take off the heat if your skin turns bright red. This is very important if you cannot feel pain, heat, or cold. You may have a greater risk of getting burned.  Take over-the-counter and prescription medicines only as told by your doctor.  Return to your normal activities as told by your doctor. Ask your doctor what activities are safe for you.  Keep all follow-up visits as told by your doctor. This is important. Contact a doctor if:  You have chills or a fever.  Your pain does not go away or it gets worse.  You have a cough that does not go away. Get help right away if:  You are short of breath. This information is not intended to replace advice given to you by your health care provider. Make sure you discuss any questions you have with your health care provider. Document Released: 12/24/2007 Document Revised: 01/25/2016 Document Reviewed: 10/31/2015 Elsevier Interactive Patient Education  2018 [employer]  

## 2017-08-12 MED ORDER — LISINOPRIL 20 MG PO TABS: 20.0000 mg | ORAL_TABLET | Freq: Every day | ORAL | 1 refills | Status: DC

## 2017-10-21 ENCOUNTER — Other Ambulatory Visit: Payer: Self-pay | Admitting: *Deleted

## 2017-10-21 DIAGNOSIS — S29011A Strain of muscle and tendon of front wall of thorax, initial encounter: Secondary | ICD-10-CM

## 2017-10-21 DIAGNOSIS — N644 Mastodynia: Secondary | ICD-10-CM

## 2017-10-21 MED ORDER — IBUPROFEN 800 MG PO TABS: 800.0000 mg | ORAL_TABLET | Freq: Three times a day (TID) | ORAL | 2 refills | Status: DC | PRN

## 2017-10-21 MED ORDER — BUDESONIDE-FORMOTEROL FUMARATE 160-4.5 MCG/ACT IN AERO: 2.0000 | INHALATION_SPRAY | Freq: Two times a day (BID) | RESPIRATORY_TRACT | 12 refills | Status: DC

## 2017-10-22 ENCOUNTER — Other Ambulatory Visit: Payer: Self-pay | Admitting: *Deleted

## 2017-10-22 ENCOUNTER — Telehealth: Payer: Self-pay | Admitting: Physician Assistant

## 2017-10-22 MED ORDER — BUDESONIDE-FORMOTEROL FUMARATE 160-4.5 MCG/ACT IN AERO: 2.0000 | INHALATION_SPRAY | Freq: Two times a day (BID) | RESPIRATORY_TRACT | 11 refills | Status: DC

## 2017-10-22 NOTE — Telephone Encounter (Signed)
Jade  Mom's inhaler is going to cost approximately 100.00 a month and with dads medications she can not afford this  Can we please see if [facility] can do a tier exception and get the cost of the medication lowered. -CF

## 2017-10-22 NOTE — Telephone Encounter (Signed)
Can we see if there is anything else insurance will pay for? She is on symbicort.

## 2017-10-22 NOTE — Telephone Encounter (Signed)
[facility] that Symbicort was not eligible for a tier exception. Form sent to scan.

## 2017-11-07 ENCOUNTER — Other Ambulatory Visit: Payer: Self-pay | Admitting: Physician Assistant

## 2017-11-07 DIAGNOSIS — J4521 Mild intermittent asthma with (acute) exacerbation: Secondary | ICD-10-CM

## 2017-11-07 MED ORDER — MONTELUKAST SODIUM 10 MG PO TABS: 10.0000 mg | ORAL_TABLET | Freq: Every day | ORAL | 5 refills | Status: DC

## 2017-11-10 ENCOUNTER — Encounter: Payer: Self-pay | Admitting: Family Medicine

## 2017-11-10 ENCOUNTER — Ambulatory Visit (INDEPENDENT_AMBULATORY_CARE_PROVIDER_SITE_OTHER): Payer: Medicare HMO | Admitting: Family Medicine

## 2017-11-10 VITALS — BP 115/74 | HR 86 | Temp 98.1°F | Ht 64.02 in | Wt 212.0 lb

## 2017-11-10 DIAGNOSIS — J0100 Acute maxillary sinusitis, unspecified: Secondary | ICD-10-CM | POA: Diagnosis not present

## 2017-11-10 DIAGNOSIS — J4521 Mild intermittent asthma with (acute) exacerbation: Secondary | ICD-10-CM

## 2017-11-10 DIAGNOSIS — H6121 Impacted cerumen, right ear: Secondary | ICD-10-CM

## 2017-11-10 MED ORDER — CEFDINIR 300 MG PO CAPS: 300.0000 mg | ORAL_CAPSULE | Freq: Two times a day (BID) | ORAL | 0 refills | Status: DC

## 2017-11-10 MED ORDER — PREDNISONE 10 MG PO TABS: 30.0000 mg | ORAL_TABLET | Freq: Every day | ORAL | 0 refills | Status: DC

## 2017-11-10 MED ORDER — MONTELUKAST SODIUM 10 MG PO TABS: 10.0000 mg | ORAL_TABLET | Freq: Every day | ORAL | 5 refills | Status: DC

## 2017-11-10 NOTE — Progress Notes (Signed)
Ana King is a 67 y.o. female who presents to [name] today for sinus pain.   Patient reports she has had several weeks of "ear fullness." This Sunday she began running a fever, having facial pain, and rhinorrhea. She also has a cough due to drainage and a sore throat. She denies shortness of breath or asthma exacerbation. She has been taking her inhaler twice a day, which is her normal dose.  She notes right-sided sinus pressure and pain.  She denies severe fevers or chills vomiting or diarrhea.  She notes decreased hearing in her right ear.   Past Medical History:  Diagnosis Date  . Essential hypertension, benign 08/28/2015  . GERD (gastroesophageal reflux disease)    Past Surgical History:  Procedure Laterality Date  . ABDOMINAL HYSTERECTOMY     Social History   Tobacco Use  . Smoking status: Former Smoker    Types: Cigarettes    Last attempt to quit: 05/21/2014    Years since quitting: 3.4  . Smokeless tobacco: Never Used  Substance Use Topics  . Alcohol use: No    Alcohol/week: 0.0 oz   family history includes Cancer in her maternal grandmother and mother; Diabetes in her maternal aunt and maternal grandmother; Hypertension in her maternal aunt.  ROS as above:  Medications: Current Outpatient Medications  Medication Sig Dispense Refill  . albuterol (PROVENTIL) (2.5 MG/3ML) 0.083% nebulizer solution Take 3 mLs (2.5 mg total) by nebulization every 6 (six) hours as needed for wheezing or shortness of breath. 90 mL 0  . budesonide-formoterol (SYMBICORT) 160-4.5 MCG/ACT inhaler Inhale 2 puffs into the lungs 2 (two) times daily. 1 Inhaler 11  . ibuprofen (ADVIL,MOTRIN) 800 MG tablet Take 1 tablet (800 mg total) by mouth every 8 (eight) hours as needed. 60 tablet 2  . Lansoprazole (PREVACID PO) Take 20 mg by mouth 2 (two) times daily.    [name] lisinopril  (PRINIVIL,ZESTRIL) 20 MG tablet Take 1 tablet (20 mg total) by mouth daily. 90 tablet 1  . montelukast (SINGULAIR) 10 MG tablet Take 1 tablet (10 mg total) by mouth at bedtime. 30 tablet 5  . cefdinir (OMNICEF) 300 MG capsule Take 1 capsule (300 mg total) by mouth 2 (two) times daily. 14 capsule 0  . predniSONE (DELTASONE) 10 MG tablet Take 3 tablets (30 mg total) by mouth daily with breakfast. 15 tablet 0   No current facility-administered medications for this visit.    Allergies  Allergen Reactions  . Penicillins Hives    Health Maintenance Health Maintenance  Topic Date Due  . Hepatitis C Screening  1951/02/11  . HIV Screening  01/06/1967  . TETANUS/TDAP  01/06/1971  . PAP SMEAR  01/05/1973  . MAMMOGRAM  01/05/2002  . COLONOSCOPY  01/05/2002  . DEXA SCAN  01/05/2017  . INFLUENZA VACCINE  02/18/2018  . PNA vac Low Risk Adult (2 of 2 - PPSV23) 03/24/2018     Exam:  BP 115/74   Pulse 86   Temp 98.1 F (36.7 C)   Ht 5' 4.02" (1.626 m)   Wt 212 lb (96.2 kg)   SpO2 95%   BMI 36.37 kg/m  Gen: Well NAD HEENT: EOMI,  MMM. Pain with percussion of the maxillary sinuses, worse on the right. Erythematous nasal membranes and throat.  Right tympanic membrane occluded by cerumen.  Following irrigation the tympanic membrane and ear canal are normal.  Left tympanic membrane normal. Lungs: Normal work of  breathing. CTABL Heart: RRR no MRG Abd: NABS, Soft. Nondistended, Nontender Exts: Brisk capillary refill, warm and well perfused.    No results found for this or any previous visit (from the past 72 hour(s)). No results found.    Assessment and Plan: 67 y.o. female with a sinus infection.   The patient's symptoms are consistent with a sinus infection. She appears moderately ill. She has sinus pain on both sides but it is worse on one. Her story of slowly getting worse makes me suspicious for bacterial sinusitis. It may be a viral sinusitis, but the worsening of her symptoms makes  treating this empirically with Cephdinir a reasonable option. We will treat wit cephdinir. She can utilize OTC mucinex-DM and sugar-free menthol cough drops for cough control.  Prednisone as backup.  Montelukast refilled. Cerumen impaction relieved via irrigation today.  Watchful waiting.    No orders of the defined types were placed in this encounter.  Meds ordered this encounter  Medications  . cefdinir (OMNICEF) 300 MG capsule    Sig: Take 1 capsule (300 mg total) by mouth 2 (two) times daily.    Dispense:  14 capsule    Refill:  0  . montelukast (SINGULAIR) 10 MG tablet    Sig: Take 1 tablet (10 mg total) by mouth at bedtime.    Dispense:  30 tablet    Refill:  5  . predniSONE (DELTASONE) 10 MG tablet    Sig: Take 3 tablets (30 mg total) by mouth daily with breakfast.    Dispense:  15 tablet    Refill:  0     Discussed warning signs or symptoms. Please see discharge instructions. Patient expresses understanding.

## 2017-11-10 NOTE — Patient Instructions (Signed)
Thank you for coming in today. Resume singulair.  Take zyrtec (Certizine) Start omnicef twice daily.  If not better next step is prednisone.  Call or go to the emergency room if you get worse, have trouble breathing, have chest pains, or palpitations.    Sinusitis, Adult Sinusitis is soreness and inflammation of your sinuses. Sinuses are hollow spaces in the bones around your face. Your sinuses are located:  Around your eyes.  In the middle of your forehead.  Behind your nose.  In your cheekbones.  Your sinuses and nasal passages are lined with a stringy fluid (mucus). Mucus normally drains out of your sinuses. When your nasal tissues become inflamed or swollen, the mucus can become trapped or blocked so air cannot flow through your sinuses. This allows bacteria, viruses, and funguses to grow, which leads to infection. Sinusitis can develop quickly and last for 7?10 days (acute) or for more than 12 weeks (chronic). Sinusitis often develops after a cold. What are the causes? This condition is caused by anything that creates swelling in the sinuses or stops mucus from draining, including:  Allergies.  Asthma.  Bacterial or viral infection.  Abnormally shaped bones between the nasal passages.  Nasal growths that contain mucus (nasal polyps).  Narrow sinus openings.  Pollutants, such as chemicals or irritants in the air.  A foreign object stuck in the nose.  A fungal infection. This is rare.  What increases the risk? The following factors may make you more likely to develop this condition:  Having allergies or asthma.  Having had a recent cold or respiratory tract infection.  Having structural deformities or blockages in your nose or sinuses.  Having a weak immune system.  Doing a lot of swimming or diving.  Overusing nasal sprays.  Smoking.  What are the signs or symptoms? The main symptoms of this condition are pain and a feeling of pressure around the  affected sinuses. Other symptoms include:  Upper toothache.  Earache.  Headache.  Bad breath.  Decreased sense of smell and taste.  A cough that may get worse at night.  Fatigue.  Fever.  Thick drainage from your nose. The drainage is often green and it may contain pus (purulent).  Stuffy nose or congestion.  Postnasal drip. This is when extra mucus collects in the throat or back of the nose.  Swelling and warmth over the affected sinuses.  Sore throat.  Sensitivity to light.  How is this diagnosed? This condition is diagnosed based on symptoms, a medical history, and a physical exam. To find out if your condition is acute or chronic, your health care provider may:  Look in your nose for signs of nasal polyps.  Tap over the affected sinus to check for signs of infection.  View the inside of your sinuses using an imaging device that has a light attached (endoscope).  If your health care provider suspects that you have chronic sinusitis, you may also:  Be tested for allergies.  Have a sample of mucus taken from your nose (nasal culture) and checked for bacteria.  Have a mucus sample examined to see if your sinusitis is related to an allergy.  If your sinusitis does not respond to treatment and it lasts longer than 8 weeks, you may have an MRI or CT scan to check your sinuses. These scans also help to determine how severe your infection is. In rare cases, a bone biopsy may be done to rule out more serious types of fungal sinus  disease. How is this treated? Treatment for sinusitis depends on the cause and whether your condition is chronic or acute. If a virus is causing your sinusitis, your symptoms will go away on their own within 10 days. You may be given medicines to relieve your symptoms, including:  Topical nasal decongestants. They shrink swollen nasal passages and let mucus drain from your sinuses.  Antihistamines. These drugs block inflammation that is  triggered by allergies. This can help to ease swelling in your nose and sinuses.  Topical nasal corticosteroids. These are nasal sprays that ease inflammation and swelling in your nose and sinuses.  Nasal saline washes. These rinses can help to get rid of thick mucus in your nose.  If your condition is caused by bacteria, you will be given an antibiotic medicine. If your condition is caused by a fungus, you will be given an antifungal medicine. Surgery may be needed to correct underlying conditions, such as narrow nasal passages. Surgery may also be needed to remove polyps. Follow these instructions at home: Medicines  Take, use, or apply over-the-counter and prescription medicines only as told by your health care provider. These may include nasal sprays.  If you were prescribed an antibiotic medicine, take it as told by your health care provider. Do not stop taking the antibiotic even if you start to feel better. Hydrate and Humidify  Drink enough water to keep your urine clear or pale yellow. Staying hydrated will help to thin your mucus.  Use a cool mist humidifier to keep the humidity level in your home above 50%.  Inhale steam for 10-15 minutes, 3-4 times a day or as told by your health care provider. You can do this in the bathroom while a hot shower is running.  Limit your exposure to cool or dry air. Rest  Rest as much as possible.  Sleep with your head raised (elevated).  Make sure to get enough sleep each night. General instructions  Apply a warm, moist washcloth to your face 3-4 times a day or as told by your health care provider. This will help with discomfort.  Wash your hands often with soap and water to reduce your exposure to viruses and other germs. If soap and water are not available, use hand sanitizer.  Do not smoke. Avoid being around people who are smoking (secondhand smoke).  Keep all follow-up visits as told by your health care provider. This is  important. Contact a health care provider if:  You have a fever.  Your symptoms get worse.  Your symptoms do not improve within 10 days. Get help right away if:  You have a severe headache.  You have persistent vomiting.  You have pain or swelling around your face or eyes.  You have vision problems.  You develop confusion.  Your neck is stiff.  You have trouble breathing. This information is not intended to replace advice given to you by your health care provider. Make sure you discuss any questions you have with your health care provider. Document Released: 07/07/2005 Document Revised: 03/02/2016 Document Reviewed: 05/02/2015 Elsevier Interactive Patient Education  [employer].

## 2017-12-08 ENCOUNTER — Other Ambulatory Visit: Payer: Self-pay | Admitting: Physician Assistant

## 2017-12-08 DIAGNOSIS — N644 Mastodynia: Secondary | ICD-10-CM

## 2017-12-08 DIAGNOSIS — S29011A Strain of muscle and tendon of front wall of thorax, initial encounter: Secondary | ICD-10-CM

## 2017-12-10 ENCOUNTER — Other Ambulatory Visit: Payer: Self-pay | Admitting: *Deleted

## 2017-12-18 ENCOUNTER — Other Ambulatory Visit: Payer: Self-pay | Admitting: Physician Assistant

## 2017-12-18 MED ORDER — HYDROCHLOROTHIAZIDE 12.5 MG PO TABS: 12.5000 mg | ORAL_TABLET | Freq: Every day | ORAL | 1 refills | Status: DC

## 2017-12-21 ENCOUNTER — Encounter: Payer: Self-pay | Admitting: Physician Assistant

## 2017-12-21 ENCOUNTER — Ambulatory Visit (INDEPENDENT_AMBULATORY_CARE_PROVIDER_SITE_OTHER): Payer: Medicare HMO | Admitting: Physician Assistant

## 2017-12-21 VITALS — BP 138/84 | HR 73 | Ht 64.0 in | Wt 212.0 lb

## 2017-12-21 DIAGNOSIS — M25472 Effusion, left ankle: Secondary | ICD-10-CM | POA: Diagnosis not present

## 2017-12-21 DIAGNOSIS — R252 Cramp and spasm: Secondary | ICD-10-CM

## 2017-12-21 DIAGNOSIS — Z79899 Other long term (current) drug therapy: Secondary | ICD-10-CM | POA: Diagnosis not present

## 2017-12-21 DIAGNOSIS — J4521 Mild intermittent asthma with (acute) exacerbation: Secondary | ICD-10-CM | POA: Diagnosis not present

## 2017-12-21 DIAGNOSIS — I878 Other specified disorders of veins: Secondary | ICD-10-CM

## 2017-12-21 MED ORDER — BUDESONIDE-FORMOTEROL FUMARATE 160-4.5 MCG/ACT IN AERO: 2.0000 | INHALATION_SPRAY | Freq: Two times a day (BID) | RESPIRATORY_TRACT | 11 refills | Status: DC

## 2017-12-21 NOTE — Progress Notes (Signed)
   Subjective:    Patient ID: Ana King, female    DOB: 04-16-1951, 67 y.o.   MRN: [id number]  HPI Pt is a 67 yo female who presents to the clinic with bilateral ankle swelling/edema. She has had off and on and made worse when on her feet. She sits with feet elevated. No recent travel or immobilization. No pain. HCTZ has helped over the weekend. No SOB other than her normal. She continues to use symbicort with no problems. She does have some intermittent cramping of hands and feet mostly at night. She admits she does not drink any water only coffee and tea. She is taking magnesium daily. It did help at first.   .. Active Ambulatory Problems    Diagnosis Date Noted  . Anxiety state 11/02/2013  . Tobacco abuse 11/02/2013  . GERD (gastroesophageal reflux disease) 11/02/2013  . Hiatal hernia 12/09/2013  . Essential hypertension, benign 08/28/2015  . Contusion 11/13/2015  . 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 Reactive airway disease 06/09/2017  . Former smoker 06/09/2017  . Chronic venous stasis 12/22/2017  . Muscle cramp 12/22/2017  . Edema of left ankle 12/22/2017   Resolved Ambulatory Problems    Diagnosis Date Noted  . COPD (chronic obstructive pulmonary disease) (HCC) 11/02/2013   Past Medical History:  Diagnosis Date  . Essential hypertension, benign 08/28/2015  . GERD (gastroesophageal reflux disease)      Review of Systems  All other systems reviewed and are negative.      Objective:   Physical Exam  Constitutional: She is oriented to person, place, and time. She appears well-developed and well-nourished.  HENT:  Head: Normocephalic and atraumatic.  Cardiovascular: Normal rate and regular rhythm.  Pulmonary/Chest: Effort normal and breath sounds normal.  Neurological: She is alert and oriented to person, place, and time.  Skin:  Left ankle scant edema. Non-pitting.   2+pedal pulses, bilaterally.   Psychiatric: She has a normal mood and affect. Her behavior is  normal.          Assessment & Plan:  [name].[name].Ana King was seen today for joint swelling.  Diagnoses and all orders for this visit:  Edema of left ankle -     Magnesium  Medication management -     COMPLETE METABOLIC PANEL WITH GFR  Muscle cramp -     Magnesium  Chronic venous stasis  Mild intermittent reactive airway disease with acute exacerbation -     budesonide-formoterol (SYMBICORT) 160-4.5 MCG/ACT inhaler; Inhale 2 puffs into the lungs 2 (two) times daily.   Edema comes and goes. No concern or risk factors for DVT. Continue HCTZ may increase to 1-2 tablets as needed for swelling. Make sure staying hydrated. Only been on for a weekend. Recheck in 1 week CMP/magnesium. Go ahead and start magnesium 250mg  twice a day for muscle cramps. Pt does not want compression stockings but will consider an ace wrap for compression. Keep feet elevated. Do not sit or walk for long periods of time. May use alka seltzer for acute cramps. Follow up as needed. Follow up in 1 month

## 2017-12-21 NOTE — Patient Instructions (Signed)

## 2017-12-22 DIAGNOSIS — I878 Other specified disorders of veins: Secondary | ICD-10-CM | POA: Insufficient documentation

## 2017-12-22 DIAGNOSIS — M25472 Effusion, left ankle: Secondary | ICD-10-CM | POA: Insufficient documentation

## 2017-12-22 DIAGNOSIS — R252 Cramp and spasm: Secondary | ICD-10-CM | POA: Insufficient documentation

## 2017-12-28 ENCOUNTER — Ambulatory Visit (INDEPENDENT_AMBULATORY_CARE_PROVIDER_SITE_OTHER): Payer: Medicare HMO | Admitting: Family Medicine

## 2017-12-28 ENCOUNTER — Encounter: Payer: Self-pay | Admitting: Family Medicine

## 2017-12-28 VITALS — BP 129/71 | HR 84 | Temp 98.1°F | Resp 97 | Wt 211.0 lb

## 2017-12-28 DIAGNOSIS — J0101 Acute recurrent maxillary sinusitis: Secondary | ICD-10-CM | POA: Diagnosis not present

## 2017-12-28 DIAGNOSIS — M25472 Effusion, left ankle: Secondary | ICD-10-CM | POA: Diagnosis not present

## 2017-12-28 DIAGNOSIS — Z79899 Other long term (current) drug therapy: Secondary | ICD-10-CM | POA: Diagnosis not present

## 2017-12-28 DIAGNOSIS — R252 Cramp and spasm: Secondary | ICD-10-CM | POA: Diagnosis not present

## 2017-12-28 MED ORDER — PREDNISONE 10 MG PO TABS: 30.0000 mg | ORAL_TABLET | Freq: Every day | ORAL | 0 refills | Status: DC

## 2017-12-28 MED ORDER — CEFDINIR 300 MG PO CAPS: 300.0000 mg | ORAL_CAPSULE | Freq: Two times a day (BID) | ORAL | 0 refills | Status: DC

## 2017-12-28 MED ORDER — IPRATROPIUM BROMIDE 0.06 % NA SOLN: 2.0000 | NASAL | 6 refills | Status: DC | PRN

## 2017-12-28 MED ORDER — BENZONATATE 200 MG PO CAPS: 200.0000 mg | ORAL_CAPSULE | Freq: Three times a day (TID) | ORAL | 0 refills | Status: DC | PRN

## 2017-12-28 NOTE — Patient Instructions (Addendum)
Thank you for coming in today. Get labs from [facility] today.  Take omnicef and cough medicine.  Use the nasal spray.  If not better take the prednisone.  Recheck as needed.   Call or go to the emergency room if you get worse, have trouble breathing, have chest pains, or palpitations.    Sinusitis, Adult Sinusitis is soreness and inflammation of your sinuses. Sinuses are hollow spaces in the bones around your face. They are located:  Around your eyes.  In the middle of your forehead.  Behind your nose.  In your cheekbones.  Your sinuses and nasal passages are lined with a stringy fluid (mucus). Mucus normally drains out of your sinuses. When your nasal tissues get inflamed or swollen, the mucus can get trapped or blocked so air cannot flow through your sinuses. This lets bacteria, viruses, and funguses grow, and that leads to infection. Follow these instructions at home: Medicines  Take, use, or apply over-the-counter and prescription medicines only as told by your doctor. These may include nasal sprays.  If you were prescribed an antibiotic medicine, take it as told by your doctor. Do not stop taking the antibiotic even if you start to feel better. Hydrate and Humidify  Drink enough water to keep your pee (urine) clear or pale yellow.  Use a cool mist humidifier to keep the humidity level in your home above 50%.  Breathe in steam for 10-15 minutes, 3-4 times a day or as told by your doctor. You can do this in the bathroom while a hot shower is running.  Try not to spend time in cool or dry air. Rest  Rest as much as possible.  Sleep with your head raised (elevated).  Make sure to get enough sleep each night. General instructions  Put a warm, moist washcloth on your face 3-4 times a day or as told by your doctor. This will help with discomfort.  Wash your hands often with soap and water. If there is no soap and water, use hand sanitizer.  Do not smoke. Avoid being around  people who are smoking (secondhand smoke).  Keep all follow-up visits as told by your doctor. This is important. Contact a doctor if:  You have a fever.  Your symptoms get worse.  Your symptoms do not get better within 10 days. Get help right away if:  You have a very bad headache.  You cannot stop throwing up (vomiting).  You have pain or swelling around your face or eyes.  You have trouble seeing.  You feel confused.  Your neck is stiff.  You have trouble breathing. This information is not intended to replace advice given to you by your health care provider. Make sure you discuss any questions you have with your health care provider. Document Released: 12/24/2007 Document Revised: 03/02/2016 Document Reviewed: 05/02/2015 Elsevier Interactive Patient Education  [employer]2018 [employer]

## 2017-12-28 NOTE — Progress Notes (Signed)
Ana King is a 67 y.o. female who presents to [name] today for sinus infection.  Patient has a history of recurrent sinus infections. She was last seen in April and was treated successfully with antibiotics. Today she states she is having a similar episode. She reports facial pain, headache, cough, nasal discharge, and one episode of a fever. She states the cough is keeping her up at night.  She denies any chills nausea vomiting diarrhea or trouble breathing. She has tried some OTC medications which have helped a bit.  ROS as above:  Exam:  BP 129/71   Pulse 84   Temp 98.1 F (36.7 C) (Oral)   Resp (!) 97   Wt 211 lb (95.7 kg)   BMI 36.22 kg/m  Gen: Well NAD HEENT: EOMI,  MMM, tender to palpation over maxillary and frontal sinuses. Erythematous nasal passages. Tympanic membranes normal.  Lungs: Normal work of breathing. CTABL Heart: RRR no MRG Abd: NABS, Soft. Nondistended, Nontender Exts: Brisk capillary refill, warm and well perfused.   Lab and Radiology Results Chest x-ray images from April 2015  reviewed showing no active cardiopulmonary disease.   Assessment and Plan: 67 y.o. female with sinusitis.  The patient has a history of sinusitis and today she has a similar presentation. Her lungs sounded clear on ausculation making pneumonia lower on my differential. It is unclear whether this is viral or bacterial at this time, but previous episodes with similar presentations have responded well to antibiotic treatment and it is reasonable to repeat that treatment today. We will also provide Tessalon for cough suppression and Atrovent to prevent further throat irritation from nasal drainage. We will also write for prednisone for her to fill if she is not getting better in the next few days.    No orders of the defined types were placed in this  encounter.  Meds ordered this encounter  Medications  . ipratropium (ATROVENT) 0.06 % nasal spray    Sig: Place 2 sprays into both nostrils every 4 (four) hours as needed for rhinitis.    Dispense:  10 mL    Refill:  6  . benzonatate (TESSALON) 200 MG capsule    Sig: Take 1 capsule (200 mg total) by mouth 3 (three) times daily as needed for cough.    Dispense:  45 capsule    Refill:  0  . cefdinir (OMNICEF) 300 MG capsule    Sig: Take 1 capsule (300 mg total) by mouth 2 (two) times daily.    Dispense:  14 capsule    Refill:  0  . predniSONE (DELTASONE) 10 MG tablet    Sig: Take 3 tablets (30 mg total) by mouth daily with breakfast.    Dispense:  15 tablet    Refill:  0     Historical information moved to improve visibility of documentation.  Past Medical History:  Diagnosis Date  . Essential hypertension, benign 08/28/2015  . GERD (gastroesophageal reflux disease)    Past Surgical History:  Procedure Laterality Date  . ABDOMINAL HYSTERECTOMY     Social History   Tobacco Use  . Smoking status: Former Smoker    Types: Cigarettes    Last attempt to quit: 05/21/2014    Years since quitting: 3.6  . Smokeless tobacco: Never Used  Substance Use Topics  . Alcohol use: No    Alcohol/week: 0.0 oz   family history includes Cancer in her maternal grandmother and  mother; Diabetes in her maternal aunt and maternal grandmother; Hypertension in her maternal aunt.  Medications: Current Outpatient Medications  Medication Sig Dispense Refill  . albuterol (PROVENTIL) (2.5 MG/3ML) 0.083% nebulizer solution Take 3 mLs (2.5 mg total) by nebulization every 6 (six) hours as needed for wheezing or shortness of breath. 90 mL 0  . budesonide-formoterol (SYMBICORT) 160-4.5 MCG/ACT inhaler Inhale 2 puffs into the lungs 2 (two) times daily. 1 Inhaler 11  . hydrochlorothiazide (HYDRODIURIL) 12.5 MG tablet TAKE 1 TABLET(12.5 MG) BY MOUTH DAILY AS NEEDED FOR LOWER LEG SWELLING 90 tablet 1  . IBU 800  MG tablet TAKE 1 TABLET BY MOUTH EVERY 8 HOURS AS NEEDED 270 tablet 2  . Lansoprazole (PREVACID PO) Take 20 mg by mouth 2 (two) times daily.    [name]. lisinopril (PRINIVIL,ZESTRIL) 20 MG tablet Take 1 tablet (20 mg total) by mouth daily. 90 tablet 1  . montelukast (SINGULAIR) 10 MG tablet Take 1 tablet (10 mg total) by mouth at bedtime. 30 tablet 5  . benzonatate (TESSALON) 200 MG capsule Take 1 capsule (200 mg total) by mouth 3 (three) times daily as needed for cough. 45 capsule 0  . cefdinir (OMNICEF) 300 MG capsule Take 1 capsule (300 mg total) by mouth 2 (two) times daily. 14 capsule 0  . ipratropium (ATROVENT) 0.06 % nasal spray Place 2 sprays into both nostrils every 4 (four) hours as needed for rhinitis. 10 mL 6  . predniSONE (DELTASONE) 10 MG tablet Take 3 tablets (30 mg total) by mouth daily with breakfast. 15 tablet 0   No current facility-administered medications for this visit.    Allergies  Allergen Reactions  . Penicillins Hives    Health Maintenance Health Maintenance  Topic Date Due  . Hepatitis C Screening  02-01-1951  . HIV Screening  01/06/1967  . TETANUS/TDAP  01/06/1971  . PAP SMEAR  01/05/1973  . MAMMOGRAM  01/05/2002  . COLONOSCOPY  01/05/2002  . DEXA SCAN  01/05/2017  . INFLUENZA VACCINE  02/18/2018  . PNA vac Low Risk Adult (2 of 2 - PPSV23) 03/24/2018    Discussed warning signs or symptoms. Please see discharge instructions. Patient expresses understanding.   I personally was present and performed or re-performed the history, physical exam and medical decision-making activities of this service and have verified that the service and findings are accurately documented in the student's note. ___________________________________________ Clementeen GrahamEvan Corey M.D., ABFM., CAQSM. Primary Care and Sports Medicine Adjunct Instructor of Family Medicine  [employer]

## 2017-12-29 LAB — COMPLETE METABOLIC PANEL WITH GFR
AG Ratio: 1.6 (calc) (ref 1.0–2.5)
ALKALINE PHOSPHATASE (APISO): 93 U/L (ref 33–130)
ALT: 11 U/L (ref 6–29)
AST: 16 U/L (ref 10–35)
Albumin: 4.2 g/dL (ref 3.6–5.1)
BUN/Creatinine Ratio: 12 (calc) (ref 6–22)
BUN: 12 mg/dL (ref 7–25)
CHLORIDE: 98 mmol/L (ref 98–110)
CO2: 27 mmol/L (ref 20–32)
CREATININE: 1 mg/dL — AB (ref 0.50–0.99)
Calcium: 9.3 mg/dL (ref 8.6–10.4)
GFR, Est African American: 68 mL/min/{1.73_m2} (ref >=60)
GFR, Est Non African American: 59 mL/min/{1.73_m2} — ABNORMAL LOW (ref >=60)
GLUCOSE: 87 mg/dL (ref 65–99)
Globulin: 2.7 g/dL (calc) (ref 1.9–3.7)
Potassium: 4.5 mmol/L (ref 3.5–5.3)
Sodium: 133 mmol/L — ABNORMAL LOW (ref 135–146)
TOTAL PROTEIN: 6.9 g/dL (ref 6.1–8.1)
Total Bilirubin: 0.5 mg/dL (ref 0.2–1.2)

## 2017-12-29 LAB — MAGNESIUM: MAGNESIUM: 2.1 mg/dL (ref 1.5–2.5)

## 2017-12-29 NOTE — Progress Notes (Signed)
Call pt: kidney function up a bit from 9 months ago as well as sodium being low. This could be due to not staying hydrated enough or taking diuretic could be causing some of this. Recheck in 2-3 weeks to make sure going back down. Make sure drinking enough!!!

## 2018-01-13 ENCOUNTER — Telehealth: Payer: Self-pay | Admitting: Physician Assistant

## 2018-01-13 DIAGNOSIS — I878 Other specified disorders of veins: Secondary | ICD-10-CM

## 2018-01-13 NOTE — Telephone Encounter (Signed)
Entered recheck of CMP for lab work. KG LPN

## 2018-01-15 DIAGNOSIS — I878 Other specified disorders of veins: Secondary | ICD-10-CM | POA: Diagnosis not present

## 2018-01-16 LAB — COMPLETE METABOLIC PANEL WITH GFR
AG RATIO: 1.5 (calc) (ref 1.0–2.5)
ALT: 9 U/L (ref 6–29)
AST: 10 U/L (ref 10–35)
Albumin: 3.8 g/dL (ref 3.6–5.1)
Alkaline phosphatase (APISO): 81 U/L (ref 33–130)
BUN/Creatinine Ratio: 19 (calc) (ref 6–22)
BUN: 24 mg/dL (ref 7–25)
CALCIUM: 9.2 mg/dL (ref 8.6–10.4)
CO2: 24 mmol/L (ref 20–32)
Chloride: 103 mmol/L (ref 98–110)
Creat: 1.26 mg/dL — ABNORMAL HIGH (ref 0.50–0.99)
GFR, EST NON AFRICAN AMERICAN: 44 mL/min/{1.73_m2} — AB (ref >=60)
GFR, Est African American: 51 mL/min/{1.73_m2} — ABNORMAL LOW (ref >=60)
GLUCOSE: 96 mg/dL (ref 65–99)
Globulin: 2.5 g/dL (calc) (ref 1.9–3.7)
POTASSIUM: 5.1 mmol/L (ref 3.5–5.3)
Sodium: 135 mmol/L (ref 135–146)
Total Bilirubin: 0.4 mg/dL (ref 0.2–1.2)
Total Protein: 6.3 g/dL (ref 6.1–8.1)

## 2018-01-17 NOTE — Telephone Encounter (Signed)
Call pt: electrolytes improved but kidney function has worsened again. Are you taking OTC ibuprofen/aleve/motrin?

## 2018-01-18 NOTE — Telephone Encounter (Signed)
Call pt: lets stop all ibuprofen and recheck in 2 weeks. Trying to see why creatine keeps climbing.

## 2018-02-16 ENCOUNTER — Other Ambulatory Visit: Payer: Self-pay | Admitting: Physician Assistant

## 2018-02-16 DIAGNOSIS — R748 Abnormal levels of other serum enzymes: Secondary | ICD-10-CM

## 2018-02-16 DIAGNOSIS — I10 Essential (primary) hypertension: Secondary | ICD-10-CM

## 2018-02-16 NOTE — Progress Notes (Signed)
Lab order placed for recheck

## 2018-02-17 DIAGNOSIS — I10 Essential (primary) hypertension: Secondary | ICD-10-CM | POA: Diagnosis not present

## 2018-02-17 DIAGNOSIS — R748 Abnormal levels of other serum enzymes: Secondary | ICD-10-CM | POA: Diagnosis not present

## 2018-02-18 LAB — COMPLETE METABOLIC PANEL WITH GFR
AG RATIO: 1.7 (calc) (ref 1.0–2.5)
ALKALINE PHOSPHATASE (APISO): 82 U/L (ref 33–130)
ALT: 10 U/L (ref 6–29)
AST: 10 U/L (ref 10–35)
Albumin: 4.1 g/dL (ref 3.6–5.1)
BILIRUBIN TOTAL: 0.4 mg/dL (ref 0.2–1.2)
BUN: 21 mg/dL (ref 7–25)
CALCIUM: 9.6 mg/dL (ref 8.6–10.4)
CHLORIDE: 101 mmol/L (ref 98–110)
CO2: 27 mmol/L (ref 20–32)
Creat: 0.96 mg/dL (ref 0.50–0.99)
GFR, Est African American: 71 mL/min/{1.73_m2} (ref >=60)
GFR, Est Non African American: 62 mL/min/{1.73_m2} (ref >=60)
Globulin: 2.4 g/dL (calc) (ref 1.9–3.7)
Glucose, Bld: 97 mg/dL (ref 65–99)
Potassium: 5.3 mmol/L (ref 3.5–5.3)
SODIUM: 135 mmol/L (ref 135–146)
TOTAL PROTEIN: 6.5 g/dL (ref 6.1–8.1)

## 2018-02-18 NOTE — Progress Notes (Signed)
GREAT news. Kidney function is MUCH better. Labs are perfect!!!

## 2018-02-22 ENCOUNTER — Other Ambulatory Visit: Payer: Self-pay

## 2018-02-22 DIAGNOSIS — I10 Essential (primary) hypertension: Secondary | ICD-10-CM

## 2018-02-22 MED ORDER — LISINOPRIL 20 MG PO TABS: 20.0000 mg | ORAL_TABLET | Freq: Every day | ORAL | 1 refills | Status: DC

## 2018-02-22 NOTE — Progress Notes (Signed)
Pt advised she is due for follow up.

## 2018-03-02 ENCOUNTER — Other Ambulatory Visit: Payer: Self-pay | Admitting: Physician Assistant

## 2018-03-02 ENCOUNTER — Ambulatory Visit (INDEPENDENT_AMBULATORY_CARE_PROVIDER_SITE_OTHER): Payer: Medicare HMO | Admitting: Physician Assistant

## 2018-03-02 ENCOUNTER — Encounter: Payer: Self-pay | Admitting: Physician Assistant

## 2018-03-02 VITALS — BP 134/71 | HR 80 | Ht 64.0 in | Wt 204.0 lb

## 2018-03-02 DIAGNOSIS — Z1231 Encounter for screening mammogram for malignant neoplasm of breast: Secondary | ICD-10-CM | POA: Diagnosis not present

## 2018-03-02 DIAGNOSIS — I10 Essential (primary) hypertension: Secondary | ICD-10-CM | POA: Diagnosis not present

## 2018-03-02 DIAGNOSIS — M25472 Effusion, left ankle: Secondary | ICD-10-CM | POA: Diagnosis not present

## 2018-03-02 DIAGNOSIS — Z13820 Encounter for screening for osteoporosis: Secondary | ICD-10-CM

## 2018-03-02 DIAGNOSIS — Z23 Encounter for immunization: Secondary | ICD-10-CM | POA: Diagnosis not present

## 2018-03-02 DIAGNOSIS — Z1211 Encounter for screening for malignant neoplasm of colon: Secondary | ICD-10-CM

## 2018-03-02 DIAGNOSIS — Z1239 Encounter for other screening for malignant neoplasm of breast: Secondary | ICD-10-CM

## 2018-03-02 DIAGNOSIS — Z780 Asymptomatic menopausal state: Secondary | ICD-10-CM | POA: Diagnosis not present

## 2018-03-02 DIAGNOSIS — J4521 Mild intermittent asthma with (acute) exacerbation: Secondary | ICD-10-CM

## 2018-03-02 MED ORDER — HYDROCHLOROTHIAZIDE 12.5 MG PO TABS: ORAL_TABLET | ORAL | 3 refills | Status: DC

## 2018-03-02 MED ORDER — LISINOPRIL 20 MG PO TABS
20.0000 mg | ORAL_TABLET | Freq: Every day | ORAL | 3 refills | Status: DC
Start: 2018-03-02 — End: 2018-08-16

## 2018-03-02 NOTE — Patient Instructions (Signed)
cologuard to be sent to house.

## 2018-03-02 NOTE — Progress Notes (Signed)
   Subjective:    Patient ID: Ana King, female    DOB: 1950-08-26, 67 y.o.   MRN: [id number]  HPI Pt is a 67 yo female with GERD, HTN, Chronic venous stasis who presents to the clinic for medication refills and follow up.   HTN- doing great. No CP, palpitations, headaches or vision changes. Taking medication daily with no problems.   Lower leg edema controlled with HCTZ daily.   .. Active Ambulatory Problems    Diagnosis Date Noted  . Anxiety state 11/02/2013  . Tobacco abuse 11/02/2013  . GERD (gastroesophageal reflux disease) 11/02/2013  . Hiatal hernia 12/09/2013  . Essential hypertension, benign 08/28/2015  . Contusion 11/13/2015  . 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 Reactive airway disease 06/09/2017  . Former smoker 06/09/2017  . Chronic venous stasis 12/22/2017  . Muscle cramp 12/22/2017  . Edema of left ankle 12/22/2017   Resolved Ambulatory Problems    Diagnosis Date Noted  . COPD (chronic obstructive pulmonary disease) (HCC) 11/02/2013   No Additional Past Medical History      Review of Systems  All other systems reviewed and are negative.      Objective:   Physical Exam  Constitutional: She is oriented to person, place, and time. She appears well-developed and well-nourished.  HENT:  Head: Normocephalic and atraumatic.  Right Ear: External ear normal.  Left Ear: External ear normal.  Cardiovascular: Normal rate and regular rhythm.  Pulmonary/Chest: Effort normal and breath sounds normal.  Neurological: She is alert and oriented to person, place, and time.  Psychiatric: She has a normal mood and affect. Her behavior is normal.          Assessment & Plan:  [name].[name].Diagnoses and all orders for this visit:  Essential hypertension, benign -     lisinopril (PRINIVIL,ZESTRIL) 20 MG tablet; Take 1 tablet (20 mg total) by mouth daily.  Screening for osteoporosis -     DG Bone Density  Post-menopausal -     DG Bone Density  Breast cancer  screening -     MM 3D SCREEN BREAST BILATERAL  Edema of left ankle -     hydrochlorothiazide (HYDRODIURIL) 12.5 MG tablet; TAKE 1 TABLET(12.5 MG) BY MOUTH DAILY AS NEEDED FOR LOWER LEG SWELLING  Need for tetanus booster -     Tdap vaccine greater than or equal to 7yo IM  Colon cancer screening   BP looks great. Refilled lisinopril.  Swelling controlled refilled HCTZ.   Tdap given today.  Ordered mammogram and bone density.  Agreed to do cologuard.

## 2018-03-02 NOTE — Progress Notes (Signed)
a 

## 2018-03-10 ENCOUNTER — Ambulatory Visit: Payer: Medicare HMO

## 2018-03-10 ENCOUNTER — Other Ambulatory Visit: Payer: Medicare HMO

## 2018-03-17 ENCOUNTER — Ambulatory Visit (INDEPENDENT_AMBULATORY_CARE_PROVIDER_SITE_OTHER): Payer: Medicare HMO

## 2018-03-17 ENCOUNTER — Encounter: Payer: Self-pay | Admitting: Physician Assistant

## 2018-03-17 DIAGNOSIS — M8588 Other specified disorders of bone density and structure, other site: Secondary | ICD-10-CM | POA: Diagnosis not present

## 2018-03-17 DIAGNOSIS — M85852 Other specified disorders of bone density and structure, left thigh: Secondary | ICD-10-CM

## 2018-03-17 DIAGNOSIS — M8580 Other specified disorders of bone density and structure, unspecified site: Secondary | ICD-10-CM | POA: Insufficient documentation

## 2018-03-17 DIAGNOSIS — Z1231 Encounter for screening mammogram for malignant neoplasm of breast: Secondary | ICD-10-CM | POA: Diagnosis not present

## 2018-03-17 NOTE — Progress Notes (Signed)
Call pt: normal mammogram. Follow up in one year.

## 2018-03-17 NOTE — Progress Notes (Signed)
Call pt: osteopenic(low bone mass) for now vitamin D 1000 units at least and 4 servings of calcium or 1300mg .

## 2018-04-15 ENCOUNTER — Encounter: Payer: Self-pay | Admitting: Physician Assistant

## 2018-04-15 ENCOUNTER — Ambulatory Visit (INDEPENDENT_AMBULATORY_CARE_PROVIDER_SITE_OTHER): Payer: Medicare HMO | Admitting: Physician Assistant

## 2018-04-15 VITALS — BP 122/62 | HR 89 | Temp 98.0°F | Ht 64.0 in | Wt 205.0 lb

## 2018-04-15 DIAGNOSIS — J309 Allergic rhinitis, unspecified: Secondary | ICD-10-CM | POA: Diagnosis not present

## 2018-04-15 MED ORDER — METHYLPREDNISOLONE SODIUM SUCC 125 MG IJ SOLR
125.0000 mg | Freq: Once | INTRAMUSCULAR | Status: AC
  Administered 2018-04-15: 125 mg via INTRAMUSCULAR

## 2018-04-15 MED ORDER — AZITHROMYCIN 250 MG PO TABS: ORAL_TABLET | ORAL | 0 refills | Status: DC

## 2018-04-15 NOTE — Patient Instructions (Signed)
Solumedrol given in office. Continue flonase. Start zyrtec or claritin.  Consider nasal saline rinses.   zpak ONLY if not improving in the next 2-3 days.   Sinusitis, Adult Sinusitis is soreness and inflammation of your sinuses. Sinuses are hollow spaces in the bones around your face. They are located:  Around your eyes.  In the middle of your forehead.  Behind your nose.  In your cheekbones.  Your sinuses and nasal passages are lined with a stringy fluid (mucus). Mucus normally drains out of your sinuses. When your nasal tissues get inflamed or swollen, the mucus can get trapped or blocked so air cannot flow through your sinuses. This lets bacteria, viruses, and funguses grow, and that leads to infection. Follow these instructions at home: Medicines  Take, use, or apply over-the-counter and prescription medicines only as told by your doctor. These may include nasal sprays.  If you were prescribed an antibiotic medicine, take it as told by your doctor. Do not stop taking the antibiotic even if you start to feel better. Hydrate and Humidify  Drink enough water to keep your pee (urine) clear or pale yellow.  Use a cool mist humidifier to keep the humidity level in your home above 50%.  Breathe in steam for 10-15 minutes, 3-4 times a day or as told by your doctor. You can do this in the bathroom while a hot shower is running.  Try not to spend time in cool or dry air. Rest  Rest as much as possible.  Sleep with your head raised (elevated).  Make sure to get enough sleep each night. General instructions  Put a warm, moist washcloth on your face 3-4 times a day or as told by your doctor. This will help with discomfort.  Wash your hands often with soap and water. If there is no soap and water, use hand sanitizer.  Do not smoke. Avoid being around people who are smoking (secondhand smoke).  Keep all follow-up visits as told by your doctor. This is important. Contact a doctor  if:  You have a fever.  Your symptoms get worse.  Your symptoms do not get better within 10 days. Get help right away if:  You have a very bad headache.  You cannot stop throwing up (vomiting).  You have pain or swelling around your face or eyes.  You have trouble seeing.  You feel confused.  Your neck is stiff.  You have trouble breathing. This information is not intended to replace advice given to you by your health care provider. Make sure you discuss any questions you have with your health care provider. Document Released: 12/24/2007 Document Revised: 03/02/2016 Document Reviewed: 05/02/2015 Elsevier Interactive Patient Education  [employer].

## 2018-04-15 NOTE — Progress Notes (Signed)
   Subjective:    Patient ID: Ana King, female    DOB: July 03, 1951, 67 y.o.   MRN: [id number]  HPI Pt is a 67 yo female with reactive airway disease, seasonal allergies, GERD who presents to the clinic with 3 days of sinus drainage, sinus pain, runny nose, ST, headache. She denies any SOB, wheezing, productive cough, or ear pain. No fever, chills. She did vomit once but feels like it was from her PND and sinus drainage. She does cough but not productive. No other sick contacts. No abdominal pain or stool changes.   .. Active Ambulatory Problems    Diagnosis Date Noted  . Anxiety state 11/02/2013  . Tobacco abuse 11/02/2013  . GERD (gastroesophageal reflux disease) 11/02/2013  . Hiatal hernia 12/09/2013  . Essential hypertension, benign 08/28/2015  . Contusion 11/13/2015  . 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 Reactive airway disease 06/09/2017  . Former smoker 06/09/2017  . Chronic venous stasis 12/22/2017  . Muscle cramp 12/22/2017  . Edema of left ankle 12/22/2017  . Osteopenia 03/17/2018   Resolved Ambulatory Problems    Diagnosis Date Noted  . COPD (chronic obstructive pulmonary disease) (HCC) 11/02/2013   No Additional Past Medical History      Review of Systems  All other systems reviewed and are negative.      Objective:   Physical Exam  Constitutional: She is oriented to person, place, and time. She appears well-developed and well-nourished.  HENT:  Head: Normocephalic and atraumatic.  Right Ear: External ear normal.  Left Ear: External ear normal.  Mouth/Throat: Oropharynx is clear and moist. No oropharyngeal exudate.  TM's clear.  Tenderness over maxillary sinuses to palpation.  Oropharynx erythematous without tonsil enlargement or exudate.  Nasal turbinates red and swollen on right only.   Eyes: Conjunctivae are normal. Right eye exhibits discharge. Left eye exhibits discharge.  Watery discharge from both eyes.   Neck: Normal range of motion. Neck  supple.  Cardiovascular: Normal rate and regular rhythm.  Pulmonary/Chest: Effort normal and breath sounds normal. She has no wheezes.  Lymphadenopathy:    She has no cervical adenopathy.  Neurological: She is alert and oriented to person, place, and time.  Skin: No rash noted.  Psychiatric: She has a normal mood and affect. Her behavior is normal.          Assessment & Plan:  Marland KitchenMarland KitchenDiagnoses and all orders for this visit:  Allergic sinusitis -     methylPREDNISolone sodium succinate (SOLU-MEDROL) 125 mg/2 mL injection 125 mg  Other orders -     azithromycin (ZITHROMAX) 250 MG tablet; Take 2 tablets now and then one tablet for 4 days.   Discussed likely allergic sinusitis. Solumedrol given in office today. Start zyrtec or claritin. Continue flonase. Start sinus rinses. Stay hydrated. If not improving in 2-3 days or if symptoms worsening can start zpak.

## 2018-04-16 ENCOUNTER — Telehealth: Payer: Self-pay

## 2018-04-16 NOTE — Telephone Encounter (Signed)
Ana King! Ana King came to me this morning and said that Ana King was able to pick up all of the prescriptions that you sent out for her yesterday, except for the Flonase. Apparently the script had been written for the Flonase back when Ana King used to use the [facility] pharmacy, but now she is using the [facility]. She just wanted to see if we could write her another script for the Flonase to be sent to [facility] in [place]. Thanks so much!

## 2018-04-17 MED ORDER — FLUTICASONE PROPIONATE 50 MCG/ACT NA SUSP: 2.0000 | Freq: Every day | NASAL | 6 refills | Status: DC

## 2018-04-17 NOTE — Telephone Encounter (Signed)
I sent this time. You can always send flonase! And you can always resend a medication that I have sent to another pharmacy as long as not a controlled substance.

## 2018-04-28 NOTE — Progress Notes (Signed)
Subjective:   Ana King is a 67 y.o. female who presents for an Initial Medicare Annual Wellness Visit.  Review of Systems    No ROS.  Medicare Wellness Visit. Additional risk factors are reflected in the social history.   Cardiac Risk Factors include: hypertension;sedentary lifestyle;advanced age (>68men, >43 women);Other (see comment)(asthma) Sleep patterns: 8-9 hours of sleep a night. Feels rested upon wakining. Gets up 3 times to go to bathroom  Home Safety/Smoke Alarms: Feels safe in home. Smoke alarms in place.  Living environment; Lives with husband in 1 story home. Steps have handrails in place. Walk in shower no grab bars in place.    Female:   Pap- aged out      Mammo- utd       Dexa scan- utd       CCS- has cologuard at home to do     Objective:    There were no vitals filed for this visit. There is no height or weight on file to calculate BMI.  Advanced Directives 05/03/2018 11/02/2013  Does Patient Have a Medical Advance Directive? No Patient does not have advance directive;Patient would like information  Would patient like information on creating a medical advance directive? Yes (MAU/Ambulatory/Procedural Areas - Information given) -    Current Medications (verified) Outpatient Encounter Medications as of 05/03/2018  Medication Sig  . budesonide-formoterol (SYMBICORT) 160-4.5 MCG/ACT inhaler Inhale 2 puffs into the lungs 2 (two) times daily.  . fluticasone (FLONASE) 50 MCG/ACT nasal spray Place 2 sprays into both nostrils daily.  . hydrochlorothiazide (HYDRODIURIL) 12.5 MG tablet TAKE 1 TABLET(12.5 MG) BY MOUTH DAILY AS NEEDED FOR LOWER LEG SWELLING  . Lansoprazole (PREVACID PO) Take 20 mg by mouth 2 (two) times daily.  [name] lisinopril (PRINIVIL,ZESTRIL) 20 MG tablet Take 1 tablet (20 mg total) by mouth daily.  . montelukast (SINGULAIR) 10 MG tablet Take 1 tablet (10 mg total) by mouth at bedtime.  [name] albuterol (PROVENTIL) (2.5 MG/3ML) 0.083% nebulizer  solution Take 3 mLs (2.5 mg total) by nebulization every 6 (six) hours as needed for wheezing or shortness of breath. (Patient not taking: Reported on 05/03/2018)  . [DISCONTINUED] azithromycin (ZITHROMAX) 250 MG tablet Take 2 tablets now and then one tablet for 4 days.   No facility-administered encounter medications on file as of 05/03/2018.     Allergies (verified) Penicillins   History: Past Medical History:  Diagnosis Date  . Asthma   . Essential hypertension, benign 08/28/2015  . GERD (gastroesophageal reflux disease)    Past Surgical History:  Procedure Laterality Date  . ABDOMINAL HYSTERECTOMY     Family History  Problem Relation Age of Onset  . Cancer Mother   . Diabetes Maternal Aunt   . Hypertension Maternal Aunt   . Cancer Maternal Grandmother   . Diabetes Maternal Grandmother    Social History   Socioeconomic History  . Marital status: Married    Spouse name: Greggory Stallion  . Number of children: 2  . Years of education: 10th  . Highest education level: 10th grade  Occupational History  . Occupation: retired    Comment: housewife  Social Needs  . Financial resource strain: Not hard at all  . Food insecurity:    Worry: Never true    Inability: Never true  . Transportation needs:    Medical: No    Non-medical: No  Tobacco Use  . Smoking status: Former Smoker    Types: Cigarettes    Last attempt to quit: 05/21/2014  Years since quitting: 3.9  . Smokeless tobacco: Never Used  Substance and Sexual Activity  . Alcohol use: Never    Alcohol/week: 0.0 standard drinks    Frequency: Never  . Drug use: Never  . Sexual activity: Not Currently  Lifestyle  . Physical activity:    Days per week: 0 days    Minutes per session: 0 min  . Stress: Not at all  Relationships  . Social connections:    Talks on phone: Twice a week    Gets together: Twice a week    Attends religious service: More than 4 times per year    Active member of club or organization: No     Attends meetings of clubs or organizations: Never    Relationship status: Married  Other Topics Concern  . Not on file  Social History Narrative   Drinks caffeine daily 3-4 cups of coffee daily. Drinks a lot of water daily.    Tobacco Counseling Counseling given: Not Answered   Clinical Intake:  Pre-visit preparation completed: Yes  Pain : No/denies pain     Nutritional Risks: None Diabetes: No  How often do you need to have someone help you when you read instructions, pamphlets, or other written materials from your doctor or pharmacy?: 1 - Never What is the last grade level you completed in school?: 10th  Interpreter Needed?: No  Information entered by :: Corrin Parker   Activities of Daily Living In your present state of health, do you have any difficulty performing the following activities: 05/03/2018  Hearing? N  Vision? N  Difficulty concentrating or making decisions? N  Walking or climbing stairs? Y  Comment gets SOB  Dressing or bathing? N  Doing errands, shopping? N  Preparing Food and eating ? N  Using the Toilet? N  In the past six months, have you accidently leaked urine? N  Do you have problems with loss of bowel control? N  Managing your Medications? N  Managing your Finances? N  Housekeeping or managing your Housekeeping? N  Some recent data might be hidden     Immunizations and Health Maintenance Immunization History  Administered Date(s) Administered  . Influenza,inj,Quad PF,6+ Mos 05/24/2014, 05/07/2015, 05/13/2016, 03/24/2017  . Pneumococcal Conjugate-13 03/24/2017  . Tdap 03/02/2018   Health Maintenance Due  Topic Date Due  . INFLUENZA VACCINE  02/18/2018  . PNA vac Low Risk Adult (2 of 2 - PPSV23) 03/24/2018    Patient Care Team: Nolene Ebbs as PCP - General (Family Medicine)  Indicate any recent Medical Services you may have received from other than [facility] providers in the past year (date may be approximate).      Assessment:   This is a routine wellness examination for Ana King. Physical assessment deferred to PCP.   Hearing/Vision screen  Visual Acuity Screening   Right eye Left eye Both eyes  Without correction:     With correction: 20/25 20/25 20/20   Hearing Screening Comments: Patient got all three words correct.  Dietary issues and exercise activities discussed: Current Exercise Habits: The patient does not participate in regular exercise at present, Exercise limited by: Other - see comments(SOB- Asthma) Diet Eats a healthy diet. Breakfast:no breakfast Lunch: no lunch Dinner: meat and vegetable   Educated patient on importance of eating at least 3 times a day   Goals    . Exercise 3x per week (30 min per time)     Walk 3 times a day for 3-5 minutes to  start to start building up your tolerance. Try and walk at least 3 times a week and build on your your time as you can tolerate.      Depression Screen PHQ 2/9 Scores 05/03/2018 11/10/2017 03/24/2017 02/23/2017 11/02/2013  PHQ - 2 Score 0 0 0 1 0  PHQ- 9 Score - 0 - - -    Fall Risk Fall Risk  05/03/2018 11/02/2013  Falls in the past year? No No    Is the patient's home free of loose throw rugs in walkways, pet beds, electrical cords, etc?   yes      Grab bars in the bathroom? no      Handrails on the stairs?   yes      Adequate lighting?   yes   Cognitive Function:     6CIT Screen 05/03/2018  What Year? 0 points  What month? 0 points  What time? 0 points  Count back from 20 0 points  Months in reverse 0 points  Repeat phrase 0 points  Total Score 0    Screening Tests Health Maintenance  Topic Date Due  . INFLUENZA VACCINE  02/18/2018  . PNA vac Low Risk Adult (2 of 2 - PPSV23) 03/24/2018  . COLONOSCOPY  03/03/2019 (Originally 01/05/2002)  . Hepatitis C Screening  03/03/2019 (Originally 1950-12-07)  . MAMMOGRAM  03/17/2020  . TETANUS/TDAP  03/02/2028  . DEXA SCAN  Completed     Plan:    Please schedule your next  medicare wellness visit with me in 1 yr.  Ms. Alleman , Thank you for taking time to come for your Medicare Wellness Visit. I appreciate your ongoing commitment to your health goals. Please review the following plan we discussed and let me know if I can assist you in the future.   Bring a copy of your living will and/or healthcare power of attorney to your next office visit.   These are the goals we discussed: Goals    . Exercise 3x per week (30 min per time)     Walk 3 times a day for 3-5 minutes to start to start building up your tolerance. Try and walk at least 3 times a week and build on your your time as you can tolerate.       This is a list of the screening recommended for you and due dates:  Health Maintenance  Topic Date Due  . Flu Shot  02/18/2018  . Pneumonia vaccines (2 of 2 - PPSV23) 03/24/2018  . Colon Cancer Screening  03/03/2019*  .  Hepatitis C: One time screening is recommended by Center for Disease Control  (CDC) for  adults born from [birth year] through 1965.   03/03/2019*  . Mammogram  03/17/2020  . Tetanus Vaccine  03/02/2028  . DEXA scan (bone density measurement)  Completed  *Topic was postponed. The date shown is not the original due date.     I have personally reviewed and noted the following in the patient's chart:   . Medical and social history . Use of alcohol, tobacco or illicit drugs  . Current medications and supplements . Functional ability and status . Nutritional status . Physical activity . Advanced directives . List of other physicians . Hospitalizations, surgeries, and ER visits in previous 12 months . Vitals . Screenings to include cognitive, depression, and falls . Referrals and appointments  In addition, I have reviewed and discussed with patient certain preventive protocols, quality metrics, and best practice recommendations. A written personalized care  plan for preventive services as well as general preventive health recommendations were  provided to patient.     Normand Sloop, LPN   [id number]

## 2018-05-03 ENCOUNTER — Ambulatory Visit (INDEPENDENT_AMBULATORY_CARE_PROVIDER_SITE_OTHER): Payer: Medicare HMO | Admitting: *Deleted

## 2018-05-03 VITALS — BP 133/71 | HR 80 | Ht 64.0 in | Wt 205.0 lb

## 2018-05-03 DIAGNOSIS — Z23 Encounter for immunization: Secondary | ICD-10-CM | POA: Diagnosis not present

## 2018-05-03 NOTE — Patient Instructions (Addendum)
Please schedule your next medicare wellness visit with me in 1 yr.  Ana King , Thank you for taking time to come for your Medicare Wellness Visit. I appreciate your ongoing commitment to your health goals. Please review the following plan we discussed and let me know if I can assist you in the future.   Please schedule your next medicare wellness visit with me in 1 yr. Bring a copy of your living will and/or healthcare power of attorney to your next office visit.   These are the goals we discussed: Goals    . Exercise 3x per week (30 min per time)

## 2018-05-08 ENCOUNTER — Other Ambulatory Visit: Payer: Self-pay | Admitting: Physician Assistant

## 2018-05-08 DIAGNOSIS — J4521 Mild intermittent asthma with (acute) exacerbation: Secondary | ICD-10-CM

## 2018-05-19 ENCOUNTER — Ambulatory Visit: Payer: Medicare HMO

## 2018-06-22 ENCOUNTER — Other Ambulatory Visit: Payer: Self-pay | Admitting: Physician Assistant

## 2018-06-22 DIAGNOSIS — J4521 Mild intermittent asthma with (acute) exacerbation: Secondary | ICD-10-CM

## 2018-06-28 ENCOUNTER — Telehealth: Payer: Self-pay

## 2018-06-28 NOTE — Telephone Encounter (Signed)
Pt called requesting samples of Symbicort 160-4.5  Two boxes have been set up front front for pt to pick up, sample book has been updated.   Lot: [id number] D00 Exp:05/2019 NDC: [id number]

## 2018-07-22 ENCOUNTER — Telehealth: Payer: Self-pay

## 2018-07-22 MED ORDER — PREDNISONE 50 MG PO TABS: 50.0000 mg | ORAL_TABLET | Freq: Every day | ORAL | 0 refills | Status: AC

## 2018-07-22 NOTE — Telephone Encounter (Signed)
Has she been using her albuterol inhaler? 2 puffs ever 4hrs as needed. Ok to send prednisone 50mg  once a day for 5 days. No refills. If not feeling better needs OV.

## 2018-07-22 NOTE — Telephone Encounter (Signed)
Patient's daughter advised and medication sent.

## 2018-07-22 NOTE — Telephone Encounter (Signed)
Ana King complains of increased shortness of breath. She states she had a gas leak in the house from the heater. She requests a round of prednisone. Please advise.

## 2018-07-23 ENCOUNTER — Ambulatory Visit: Payer: Medicare HMO | Admitting: Physician Assistant

## 2018-07-23 ENCOUNTER — Ambulatory Visit (INDEPENDENT_AMBULATORY_CARE_PROVIDER_SITE_OTHER): Payer: Medicare HMO | Admitting: Physician Assistant

## 2018-07-23 ENCOUNTER — Encounter: Payer: Self-pay | Admitting: Physician Assistant

## 2018-07-23 VITALS — BP 128/57 | HR 83 | Temp 98.8°F | Ht 64.0 in | Wt 202.6 lb

## 2018-07-23 DIAGNOSIS — J4521 Mild intermittent asthma with (acute) exacerbation: Secondary | ICD-10-CM | POA: Diagnosis not present

## 2018-07-23 NOTE — Progress Notes (Signed)
   Subjective:    Patient ID: Ana King, female    DOB: May 30, 1951, 68 y.o.   MRN: [id number]  HPI  Pt is a 68 yo female with reactive airway disease who presents to the clinic with SOB and on episode of increased heart rate. She admits that about 1 week before Christmas she started smelling gas in her house. Her husband did not smell it and did not believe her until after he started smelling it. A pipe was leaking in the basement. She started using her albuterol inhaler more. She called into the clinic and we sent prednisone but she has not picked up yet. Yesterday after taking a puff of symbicort her heart felt like it was racing for a few minutes. No CP. Some chest tightness but no wheezing. No fever, chills, body aches.   .. Active Ambulatory Problems    Diagnosis Date Noted  . Anxiety state 11/02/2013  . Tobacco abuse 11/02/2013  . GERD (gastroesophageal reflux disease) 11/02/2013  . Hiatal hernia 12/09/2013  . Essential hypertension, benign 08/28/2015  . Contusion 11/13/2015  . 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 Reactive airway disease 06/09/2017  . Former smoker 06/09/2017  . Chronic venous stasis 12/22/2017  . Muscle cramp 12/22/2017  . Edema of left ankle 12/22/2017  . Osteopenia 03/17/2018   Resolved Ambulatory Problems    Diagnosis Date Noted  . COPD (chronic obstructive pulmonary disease) (HCC) 11/02/2013   Past Medical History:  Diagnosis Date  . Asthma       Review of Systems See HPI.     Objective:   Physical Exam Vitals signs reviewed.  Constitutional:      Appearance: Normal appearance.  HENT:     Head: Normocephalic and atraumatic.     Right Ear: Tympanic membrane normal.     Left Ear: Tympanic membrane normal.     Nose: Nose normal.     Mouth/Throat:     Mouth: Mucous membranes are moist.  Eyes:     Conjunctiva/sclera: Conjunctivae normal.  Cardiovascular:     Rate and Rhythm: Normal rate and regular rhythm.     Pulses: Normal pulses.   Heart sounds: Normal heart sounds.  Pulmonary:     Effort: Pulmonary effort is normal.     Breath sounds: Normal breath sounds. No wheezing or rhonchi.  Neurological:     General: No focal deficit present.     Mental Status: She is alert and oriented to person, place, and time.  Psychiatric:        Mood and Affect: Mood normal.        Behavior: Behavior normal.           Assessment & Plan:  Marland KitchenMarland KitchenGracia was seen today for tachycardia.  Diagnoses and all orders for this visit:  Mild intermittent reactive airway disease with acute exacerbation  vitals reassuring. HR perfect.I do suspect that gas fumes has caused some reactive airway disease flare. Continue to use albuterol. Prednisone is at the pharmacy and ok to take for 5 days. Follow up as needed.

## 2018-07-23 NOTE — Patient Instructions (Signed)
Start prednisone.  No signs of infection.

## 2018-08-16 ENCOUNTER — Other Ambulatory Visit: Payer: Self-pay | Admitting: Physician Assistant

## 2018-08-16 DIAGNOSIS — I10 Essential (primary) hypertension: Secondary | ICD-10-CM

## 2018-09-29 ENCOUNTER — Other Ambulatory Visit: Payer: Self-pay

## 2018-09-29 ENCOUNTER — Ambulatory Visit (INDEPENDENT_AMBULATORY_CARE_PROVIDER_SITE_OTHER): Payer: Medicare HMO | Admitting: Physician Assistant

## 2018-09-29 ENCOUNTER — Encounter: Payer: Self-pay | Admitting: Physician Assistant

## 2018-09-29 VITALS — BP 132/56 | HR 77 | Temp 98.3°F | Wt 208.0 lb

## 2018-09-29 DIAGNOSIS — J0100 Acute maxillary sinusitis, unspecified: Secondary | ICD-10-CM | POA: Diagnosis not present

## 2018-09-29 DIAGNOSIS — J302 Other seasonal allergic rhinitis: Secondary | ICD-10-CM | POA: Diagnosis not present

## 2018-09-29 MED ORDER — AZITHROMYCIN 250 MG PO TABS: ORAL_TABLET | ORAL | 0 refills | Status: DC

## 2018-09-29 NOTE — Patient Instructions (Signed)

## 2018-09-29 NOTE — Progress Notes (Signed)
   Subjective:    Patient ID: Ana King, female    DOB: 06/11/1951, 68 y.o.   MRN: [id number]  HPI  Patient is a 68 year old female with reactive airway disease who presents to the clinic with sinus pressure, congestion, headache, nasal congestion for the last 7 days.  Symptoms started about 7 days ago when she was traveling to [place].  She is used over-the-counter Mucinex with little relief.  She continues to become more more congested.  She denies any fever, chills, body aches, wheezing.  At times she does feel a little short of breath.  She continues to use her Symbicort daily.  .. Active Ambulatory Problems    Diagnosis Date Noted  . Anxiety state 11/02/2013  . Tobacco abuse 11/02/2013  . GERD (gastroesophageal reflux disease) 11/02/2013  . Hiatal hernia 12/09/2013  . Essential hypertension, benign 08/28/2015  . Contusion 11/13/2015  . 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 Reactive airway disease 06/09/2017  . Former smoker 06/09/2017  . Chronic venous stasis 12/22/2017  . Muscle cramp 12/22/2017  . Edema of left ankle 12/22/2017  . Osteopenia 03/17/2018   Resolved Ambulatory Problems    Diagnosis Date Noted  . COPD (chronic obstructive pulmonary disease) (HCC) 11/02/2013   Past Medical History:  Diagnosis Date  . Asthma       Review of Systems See HPI.     Objective:   Physical Exam Vitals signs reviewed.  Constitutional:      Appearance: Normal appearance.  HENT:     Head: Normocephalic.     Comments: Tenderness over maxillary sinuses to palpation.     Right Ear: Tympanic membrane normal.     Left Ear: Tympanic membrane normal.     Nose: Congestion and rhinorrhea present.     Mouth/Throat:     Mouth: Mucous membranes are moist.     Pharynx: No posterior oropharyngeal erythema.  Cardiovascular:     Rate and Rhythm: Normal rate and regular rhythm.     Pulses: Normal pulses.  Pulmonary:     Effort: Pulmonary effort is normal.     Breath sounds: Normal  breath sounds. No wheezing or rhonchi.  Lymphadenopathy:     Cervical: No cervical adenopathy.  Neurological:     General: No focal deficit present.     Mental Status: She is alert and oriented to person, place, and time.  Psychiatric:        Mood and Affect: Mood normal.           Assessment & Plan:  Marland KitchenMarland KitchenLynnae was seen today for sinusitis.  Diagnoses and all orders for this visit:  Acute non-recurrent maxillary sinusitis -     azithromycin (ZITHROMAX) 250 MG tablet; Take 2 tablets now and then one tablet for 4 days.  Seasonal allergic rhinitis, unspecified trigger   Start taking zyrtec daily through spring. Symptoms for 7 days. Treated with zpak due to PCN allergy. Add flonse daily. HO given. Follow up as needed.

## 2018-11-16 ENCOUNTER — Other Ambulatory Visit: Payer: Self-pay | Admitting: Physician Assistant

## 2018-11-16 DIAGNOSIS — J4521 Mild intermittent asthma with (acute) exacerbation: Secondary | ICD-10-CM

## 2019-01-11 ENCOUNTER — Ambulatory Visit (INDEPENDENT_AMBULATORY_CARE_PROVIDER_SITE_OTHER): Payer: Medicare HMO | Admitting: Physician Assistant

## 2019-01-11 ENCOUNTER — Other Ambulatory Visit: Payer: Self-pay

## 2019-01-11 ENCOUNTER — Encounter: Payer: Self-pay | Admitting: Physician Assistant

## 2019-01-11 VITALS — BP 143/71 | HR 79 | Temp 98.2°F | Ht 63.5 in | Wt 204.0 lb

## 2019-01-11 DIAGNOSIS — F329 Major depressive disorder, single episode, unspecified: Secondary | ICD-10-CM

## 2019-01-11 DIAGNOSIS — R0602 Shortness of breath: Secondary | ICD-10-CM

## 2019-01-11 DIAGNOSIS — R4589 Other symptoms and signs involving emotional state: Secondary | ICD-10-CM | POA: Insufficient documentation

## 2019-01-11 DIAGNOSIS — J4521 Mild intermittent asthma with (acute) exacerbation: Secondary | ICD-10-CM | POA: Diagnosis not present

## 2019-01-11 MED ORDER — PREDNISONE 50 MG PO TABS: ORAL_TABLET | ORAL | 0 refills | Status: DC

## 2019-01-11 NOTE — Progress Notes (Addendum)
Subjective:    Patient ID: Ana King, female    DOB: 1951/07/04, 68 y.o.   MRN: [id number]  HPI Patient presents with complaints of shortness of breath on exertion x 1 1/2 weeks. States she had a similar episode 3 months ago that we treated with Prednisone which helped relieve her symptoms. States the shortness of breath is interfering with her daily routine (washing dishes, cooking, walking to the mail box, etc) and her rescue inhaler does not help. Denies sinus pressure, cough, chest pain, palpitations, orthopnea, or LE edema. She remains on symbicort daily.   Also, 1 week ago she began to feel lightheaded upon standing. States she has loss her balance due to this several times but denies any falls.   Additionally, during this time she has also felt increased fatigue and depression-like symptoms. She associates these symptoms with her new onset shortness of breath that is interfering with her daily life.    Review of Systems  Constitutional: Positive for fatigue. Negative for appetite change, chills, fever and unexpected weight change.  HENT: Positive for sneezing. Negative for congestion, rhinorrhea, sinus pressure, sinus pain, sore throat and trouble swallowing.   Eyes: Negative for pain, discharge and redness.  Respiratory: Positive for shortness of breath. Negative for cough, choking, chest tightness and wheezing.   Cardiovascular: Negative for chest pain, palpitations and leg swelling.  Gastrointestinal: Negative for abdominal pain, blood in stool and nausea.  Musculoskeletal: Negative for arthralgias and gait problem.  Neurological: Positive for light-headedness. Negative for dizziness, syncope, weakness, numbness and headaches.  Psychiatric/Behavioral: The patient is nervous/anxious.        Objective:   Physical Exam Constitutional:      General: She is not in acute distress. HENT:     Head: Normocephalic and atraumatic.     Mouth/Throat:     Mouth: Mucous membranes  are moist.     Pharynx: No pharyngeal swelling or oropharyngeal exudate.  Eyes:     Extraocular Movements: Extraocular movements intact.  Neck:     Musculoskeletal: Normal range of motion and neck supple.  Cardiovascular:     Rate and Rhythm: Normal rate and regular rhythm.  Pulmonary:     Effort: Pulmonary effort is normal. No tachypnea, accessory muscle usage or respiratory distress.     Breath sounds: Normal breath sounds. No stridor. No decreased breath sounds, wheezing, rhonchi or rales.  Abdominal:     General: Bowel sounds are normal.     Palpations: Abdomen is soft.  Musculoskeletal:     Right lower leg: She exhibits no tenderness. No edema.     Left lower leg: She exhibits no tenderness. No edema.  Skin:    General: Skin is warm and dry.  Neurological:     General: No focal deficit present.     Mental Status: She is alert and oriented to person, place, and time.  Psychiatric:        Mood and Affect: Mood normal.        Behavior: Behavior normal.    . [facility] 01/11/2019  Decreased Interest 0  Down, Depressed, Hopeless 0  PHQ - 2 Score 0  Altered sleeping 0  Tired, decreased energy 3  Change in appetite 0  Feeling bad or failure about yourself  0  Trouble concentrating 0  Moving slowly or fidgety/restless 0  Suicidal thoughts 0  PHQ-9 Score 3  Difficult doing work/chores Not difficult at all    .[name]. GAD 7 : Generalized Anxiety  Score 01/11/2019 11/10/2017  Nervous, Anxious, on Edge 1 0  Control/stop worrying 0 0  Worry too much - different things 0 0  Trouble relaxing 0 0  Restless 0 0  Easily annoyed or irritable 0 0  Afraid - awful might happen 0 0  Total GAD 7 Score 1 0  Anxiety Difficulty Not difficult at all -         Assessment & Plan:  Shortness of breath on exertion - 6 minute walk with pulse ox check normal. This test was also normal 3 months ago when the presented with similar symptoms. Prescribed Prednisone 50 mg daily x 5  days since this worked in the past. Instructed patient to follow up via mychart Friday if symptoms persist or worsen. I think there could be some physical deconditioning going on with inactivity with COVID and now more active with humidity.   Reactive Airway Disease - States prior to this episode she was doing well with daily Symbicort and prn albuterol. Will continue this regimen at this time but alter if symptoms do not improve with steroid burst.   Depressed mood - GAD7: 1 and PHQ9: 3. All concerns were related to shortness of breath. Instructed patient to follow up if feelings continue after [facility] resolves.   Essential hypertension, benign - BP 161/75 upon arrival but decreased to 143/71 at the end of the visit. Instructed patient to monitor BP at home. Will continue to monitor to see if medications needs to be altered. At this time, continue regimen as prescribed.   GERD - Denies any heartburn or reflux with current medication. Continue Prevacid as prescribed.   Marland KitchenVernetta Honey PA-C, have reviewed and agree with the above documentation in it's entirety.

## 2019-01-11 NOTE — Progress Notes (Signed)
Six minute walk test. No issues. Felt "a little" SOB. O2 consistently 94%.

## 2019-01-11 NOTE — Patient Instructions (Signed)

## 2019-01-13 ENCOUNTER — Other Ambulatory Visit: Payer: Self-pay | Admitting: Physician Assistant

## 2019-01-13 DIAGNOSIS — J4521 Mild intermittent asthma with (acute) exacerbation: Secondary | ICD-10-CM

## 2019-03-04 ENCOUNTER — Other Ambulatory Visit: Payer: Self-pay | Admitting: Physician Assistant

## 2019-03-04 DIAGNOSIS — I10 Essential (primary) hypertension: Secondary | ICD-10-CM

## 2019-03-16 ENCOUNTER — Other Ambulatory Visit: Payer: Self-pay | Admitting: Physician Assistant

## 2019-03-18 ENCOUNTER — Encounter: Payer: Self-pay | Admitting: Physician Assistant

## 2019-03-18 ENCOUNTER — Ambulatory Visit (INDEPENDENT_AMBULATORY_CARE_PROVIDER_SITE_OTHER): Payer: Medicare HMO | Admitting: Physician Assistant

## 2019-03-18 VITALS — BP 144/81 | HR 85 | Temp 98.5°F | Wt 206.0 lb

## 2019-03-18 DIAGNOSIS — Z23 Encounter for immunization: Secondary | ICD-10-CM | POA: Diagnosis not present

## 2019-03-18 DIAGNOSIS — R21 Rash and other nonspecific skin eruption: Secondary | ICD-10-CM

## 2019-03-18 MED ORDER — PREDNISONE 50 MG PO TABS: ORAL_TABLET | ORAL | 0 refills | Status: DC

## 2019-03-18 MED ORDER — HYDROXYZINE HCL 10 MG PO TABS: 10.0000 mg | ORAL_TABLET | Freq: Every evening | ORAL | 0 refills | Status: DC | PRN

## 2019-03-18 MED ORDER — CETIRIZINE HCL 10 MG PO TABS: 10.0000 mg | ORAL_TABLET | Freq: Every day | ORAL | 0 refills | Status: DC

## 2019-03-18 NOTE — Patient Instructions (Signed)
Contact Dermatitis Dermatitis is redness, soreness, and swelling (inflammation) of the skin. Contact dermatitis is a reaction to certain substances that touch the skin. Many different substances can cause contact dermatitis. There are two types of contact dermatitis:  Irritant contact dermatitis. This type is caused by something that irritates your skin, such as having dry hands from washing them too often with soap. This type does not require previous exposure to the substance for a reaction to occur. This is the most common type.  Allergic contact dermatitis. This type is caused by a substance that you are allergic to, such as poison ivy. This type occurs when you have been exposed to the substance (allergen) and develop a sensitivity to it. Dermatitis may develop soon after your first exposure to the allergen, or it may not develop until the next time you are exposed and every time thereafter. What are the causes? Irritant contact dermatitis is most commonly caused by exposure to:  Makeup.  Soaps.  Detergents.  Bleaches.  Acids.  Metal salts, such as nickel. Allergic contact dermatitis is most commonly caused by exposure to:  Poisonous plants.  Chemicals.  Jewelry.  Latex.  Medicines.  Preservatives in products, such as clothing. What increases the risk? You are more likely to develop this condition if you have:  A job that exposes you to irritants or allergens.  Certain medical conditions, such as asthma or eczema. What are the signs or symptoms? Symptoms of this condition may occur on your body anywhere the irritant has touched you or is touched by you.  Symptoms include: ? Dryness or flaking. ? Redness. ? Cracks. ? Itching. ? Pain or a burning feeling. ? Blisters. ? Drainage of small amounts of blood or clear fluid from skin cracks. With allergic contact dermatitis, there may also be swelling in areas such as the eyelids, mouth, or genitals. How is this  diagnosed? This condition is diagnosed with a medical history and physical exam.  A patch skin test may be performed to help determine the cause.  If the condition is related to your job, you may need to see an occupational medicine specialist. How is this treated? This condition is treated by checking for the cause of the reaction and protecting your skin from further contact. Treatment may also include:  Steroid creams or ointments. Oral steroid medicines may be needed in more severe cases.  Antibiotic medicines or antibacterial ointments, if a skin infection is present.  Antihistamine lotion or an antihistamine taken by mouth to ease itching.  A bandage (dressing). Follow these instructions at home: Skin care  Moisturize your skin as needed.  Apply cool compresses to the affected areas.  Try applying baking soda paste to your skin. Stir water into baking soda until it reaches a paste-like consistency.  Do not scratch your skin, and avoid friction to the affected area.  Avoid the use of soaps, perfumes, and dyes. Medicines  Take or apply over-the-counter and prescription medicines only as told by your health care provider.  If you were prescribed an antibiotic medicine, take or apply the antibiotic as told by your health care provider. Do not stop using the antibiotic even if your condition improves. Bathing  Try taking a bath with: ? Epsom salts. Follow the instructions on the packaging. You can get these at your local pharmacy or grocery store. ? Baking soda. Pour a small amount into the bath as directed by your health care provider. ? Colloidal oatmeal. Follow the instructions on the   packaging. You can get this at your local pharmacy or grocery store.  Bathe less frequently, such as every other day.  Bathe in lukewarm water. Avoid using hot water. Bandage care  If you were given a bandage (dressing), change it as told by your health care provider.  Wash your hands  with soap and water before and after you change your dressing. If soap and water are not available, use hand sanitizer. General instructions  Avoid the substance that caused your reaction. If you do not know what caused it, keep a journal to try to track what caused it. Write down: ? What you eat. ? What cosmetic products you use. ? What you drink. ? What you wear in the affected area. This includes jewelry.  More redness, swelling, or pain.  More fluid or blood.  Warmth.  Pus or a bad smell.  Keep all follow-up visits as told by your health care provider. This is important. Contact a health care provider if:  Your condition does not improve with treatment.  Your condition gets worse.  You have signs of infection such as swelling, tenderness, redness, soreness, or warmth in the affected area.  You have a fever.  You have new symptoms. Get help right away if:  You have a severe headache, neck pain, or neck stiffness.  You vomit.  You feel very sleepy.  You notice red streaks coming from the affected area.  Your bone or joint underneath the affected area becomes painful after the skin has healed.  The affected area turns darker.  You have difficulty breathing. Summary  Dermatitis is redness, soreness, and swelling (inflammation) of the skin. Contact dermatitis is a reaction to certain substances that touch the skin.  Symptoms of this condition may occur on your body anywhere the irritant has touched you or is touched by you.  This condition is treated by figuring out what caused the reaction and protecting your skin from further contact. Treatment may also include medicines and skin care.  Avoid the substance that caused your reaction. If you do not know what caused it, keep a journal to try to track what caused it.  Contact a health care provider if your condition gets worse or you have signs of infection such as swelling, tenderness, redness, soreness, or warmth  in the affected area. This information is not intended to replace advice given to you by your health care provider. Make sure you discuss any questions you have with your health care provider. Document Released: 07/04/2000 Document Revised: 10/27/2018 Document Reviewed: 01/20/2018 Elsevier Patient Education  2020 Elsevier Inc.  

## 2019-03-18 NOTE — Progress Notes (Signed)
HPI:                                                                Ana RhymesDeborah King is a 68 y.o. female [facility] [place] today for rash  Onset: 5 days ago Location: started on right upper arm, has spread to abdomen, thighs and bilateral forearms Duration: constant Character: very itchy, sometimes burns Aggravating factors / Triggers: none Treatments tried: OTC anit-itch cream  Recent illness / systemic symptoms: none  Medication / drug exposure: none Recent travel: [place], stayed in a motel  Animal/insect exposure: none History of allergies: no Exposure to new soaps, perfumes, cleaning products: none Exposure to chemicals: none     Past Medical History:  Diagnosis Date  . Asthma   . Essential hypertension, benign 08/28/2015  . GERD (gastroesophageal reflux disease)    Past Surgical History:  Procedure Laterality Date  . ABDOMINAL HYSTERECTOMY     Social History   Tobacco Use  . Smoking status: Former Smoker    Types: Cigarettes    Quit date: 05/21/2014    Years since quitting: 4.8  . Smokeless tobacco: Never Used  Substance Use Topics  . Alcohol use: Never    Alcohol/week: 0.0 standard drinks    Frequency: Never   family history includes Cancer in her maternal grandmother and mother; Diabetes in her maternal aunt and maternal grandmother; Hypertension in her maternal aunt.    ROS: negative except as noted in the HPI  Medications: Current Outpatient Medications  Medication Sig Dispense Refill  . albuterol (PROVENTIL) (2.5 MG/3ML) 0.083% nebulizer solution Take 3 mLs (2.5 mg total) by nebulization every 6 (six) hours as needed for wheezing or shortness of breath. 90 mL 0  . hydrochlorothiazide (HYDRODIURIL) 12.5 MG tablet TAKE 1 TABLET EVERY DAY AS NEEDED  LOWER  LEG  SWELLING 90 tablet 1  . Lansoprazole (PREVACID PO) Take 20 mg by mouth 2 (two) times daily.    [name]. lisinopril (ZESTRIL) 20 MG tablet TAKE 1  TABLET EVERY DAY 90 tablet 3  . montelukast (SINGULAIR) 10 MG tablet TAKE 1 TABLET AT BEDTIME 90 tablet 1  . predniSONE (DELTASONE) 50 MG tablet One tab PO daily for 5 days. 5 tablet 0  . budesonide-formoterol (SYMBICORT) 160-4.5 MCG/ACT inhaler Inhale 2 puffs into the lungs 2 (two) times daily. 1 Inhaler 11  . cetirizine (ZYRTEC) 10 MG tablet Take 1 tablet (10 mg total) by mouth daily for 5 days. 5 tablet 0  . hydrOXYzine (ATARAX/VISTARIL) 10 MG tablet Take 1-2 tablets (10-20 mg total) by mouth at bedtime as needed for itching. 10 tablet 0   No current facility-administered medications for this visit.    Allergies  Allergen Reactions  . Penicillins Hives       Objective:  BP (!) 144/81   Pulse 85   Temp 98.5 F (36.9 C) (Oral)   Wt 206 lb (93.4 kg)   BMI 35.92 kg/m  Gen:  alert, not ill-appearing, no distress, appropriate for age HEENT: head normocephalic without obvious abnormality, conjunctiva and cornea clear, trachea midline Pulm: Normal work of breathing, normal phonation, clear to auscultation bilaterally, no wheezes, rales or rhonchi CV: Normal rate, regular rhythm, s1 and s2 distinct, no murmurs, clicks or  rubs  Neuro: alert and oriented x 3, no tremor MSK: extremities atraumatic, normal gait and station Skin: intact, maculopapular red rash of extremities and abdomen   No results found for this or any previous visit (from the past 72 hour(s)). No results found.    Assessment and Plan: 68 y.o. female with   .Ana King was seen today for rash.  Diagnoses and all orders for this visit:  Rash and nonspecific skin eruption -     predniSONE (DELTASONE) 50 MG tablet; One tab PO daily for 5 days. -     hydrOXYzine (ATARAX/VISTARIL) 10 MG tablet; Take 1-2 tablets (10-20 mg total) by mouth at bedtime as needed for itching. -     cetirizine (ZYRTEC) 10 MG tablet; Take 1 tablet (10 mg total) by mouth daily for 5 days.  Need for immunization against influenza -     Flu  Vaccine QUAD High Dose(Fluad)   Counseled on general measures for contact dermatitis DDx also includes insect bite, scabies and bed bugs Return if no improvement    Patient education and anticipatory guidance given Patient agrees with treatment plan Follow-up as needed if symptoms worsen or fail to improve  Darlyne Russian PA-C

## 2019-04-01 ENCOUNTER — Other Ambulatory Visit: Payer: Self-pay | Admitting: Neurology

## 2019-04-01 DIAGNOSIS — J4521 Mild intermittent asthma with (acute) exacerbation: Secondary | ICD-10-CM

## 2019-04-01 MED ORDER — BUDESONIDE-FORMOTEROL FUMARATE 160-4.5 MCG/ACT IN AERO: 2.0000 | INHALATION_SPRAY | Freq: Two times a day (BID) | RESPIRATORY_TRACT | 11 refills | Status: DC

## 2019-04-12 ENCOUNTER — Other Ambulatory Visit: Payer: Self-pay

## 2019-04-12 ENCOUNTER — Encounter: Payer: Self-pay | Admitting: Sports Medicine

## 2019-04-12 ENCOUNTER — Other Ambulatory Visit: Payer: Self-pay | Admitting: Sports Medicine

## 2019-04-12 ENCOUNTER — Ambulatory Visit (INDEPENDENT_AMBULATORY_CARE_PROVIDER_SITE_OTHER): Payer: Medicare HMO | Admitting: Sports Medicine

## 2019-04-12 ENCOUNTER — Ambulatory Visit (INDEPENDENT_AMBULATORY_CARE_PROVIDER_SITE_OTHER): Payer: Medicare HMO

## 2019-04-12 DIAGNOSIS — M25511 Pain in right shoulder: Secondary | ICD-10-CM

## 2019-04-12 MED ORDER — MELOXICAM 15 MG PO TABS: ORAL_TABLET | ORAL | 3 refills | Status: DC

## 2019-04-12 NOTE — Progress Notes (Signed)
Subjective:    CC: Right shoulder pain  HPI: For over a month now this pleasant 68 year old female has had pain in her right shoulder, anteriorly, proximally, worse when holding her 13 pound grandnephew.  Pain is moderate, persistent, localized without radiation.  I reviewed the past medical history, family history, social history, surgical history, and allergies today and no changes were needed.  Please see the problem list section below in epic for further details.  Past Medical History: Past Medical History:  Diagnosis Date  . Asthma   . Essential hypertension, benign 08/28/2015  . GERD (gastroesophageal reflux disease)    Past Surgical History: Past Surgical History:  Procedure Laterality Date  . ABDOMINAL HYSTERECTOMY     Social History: Social History   Socioeconomic History  . Marital status: Married    Spouse name: Greggory Stallion  . Number of children: 2  . Years of education: 10th  . Highest education level: 10th grade  Occupational History  . Occupation: retired    Comment: housewife  Social Needs  . Financial resource strain: Not hard at all  . Food insecurity    Worry: Never true    Inability: Never true  . Transportation needs    Medical: No    Non-medical: No  Tobacco Use  . Smoking status: Former Smoker    Types: Cigarettes    Quit date: 05/21/2014    Years since quitting: 4.8  . Smokeless tobacco: Never Used  Substance and Sexual Activity  . Alcohol use: Never    Alcohol/week: 0.0 standard drinks    Frequency: Never  . Drug use: Never  . Sexual activity: Not Currently  Lifestyle  . Physical activity    Days per week: 0 days    Minutes per session: 0 min  . Stress: Not at all  Relationships  . Social Musician on phone: Twice a week    Gets together: Twice a week    Attends religious service: More than 4 times per year    Active member of club or organization: No    Attends meetings of clubs or organizations: Never    Relationship  status: Married  Other Topics Concern  . Not on file  Social History Narrative   Drinks caffeine daily 3-4 cups of coffee daily. Drinks a lot of water daily.   Family History: Family History  Problem Relation Age of Onset  . Cancer Mother   . Diabetes Maternal Aunt   . Hypertension Maternal Aunt   . Cancer Maternal Grandmother   . Diabetes Maternal Grandmother    Allergies: Allergies  Allergen Reactions  . Penicillins Hives   Medications: See med rec.  Review of Systems: No fevers, chills, night sweats, weight loss, chest pain, or shortness of breath.   Objective:    General: Well Developed, well nourished, and in no acute distress.  Neuro: Alert and oriented x3, extra-ocular muscles intact, sensation grossly intact.  HEENT: Normocephalic, atraumatic, pupils equal round reactive to light, neck supple, no masses, no lymphadenopathy, thyroid nonpalpable.  Skin: Warm and dry, no rashes. Cardiac: Regular rate and rhythm, no murmurs rubs or gallops, no lower extremity edema.  Respiratory: Clear to auscultation bilaterally. Not using accessory muscles, speaking in full sentences. Right shoulder: Inspection reveals no abnormalities, atrophy or asymmetry. Palpation is normal with no tenderness over AC joint or bicipital groove. ROM is full in all planes. Rotator cuff strength normal throughout. No signs of impingement with negative Neer and Hawkin's tests, empty  can. Positive speeds and Yergason tests. No labral pathology noted with negative Obrien's, negative crank, negative clunk, and good stability. Normal scapular function observed. No painful arc and no drop arm sign. No apprehension sign  Impression and Recommendations:    Right shoulder pain Subacute chronicity, likely biceps tendinitis from holding her great nephew. Adding x-rays, home rehab, NSAIDs. If no better in a month we will do a biceps sheath injection.   ___________________________________________ Gwen Her. Dianah Field, M.D., ABFM., CAQSM. Primary Care and Sports Medicine [facility]  Adjunct Professor of [employer]

## 2019-04-12 NOTE — Assessment & Plan Note (Addendum)
Subacute chronicity, likely biceps tendinitis from holding her great nephew. Adding x-rays, home rehab, NSAIDs. If no better in a month we will do a biceps sheath injection.

## 2019-05-09 ENCOUNTER — Ambulatory Visit: Payer: Medicare HMO

## 2019-05-10 ENCOUNTER — Encounter: Payer: Self-pay | Admitting: Sports Medicine

## 2019-05-10 ENCOUNTER — Ambulatory Visit (INDEPENDENT_AMBULATORY_CARE_PROVIDER_SITE_OTHER): Payer: Medicare HMO | Admitting: Sports Medicine

## 2019-05-10 ENCOUNTER — Other Ambulatory Visit: Payer: Self-pay

## 2019-05-10 DIAGNOSIS — M25511 Pain in right shoulder: Secondary | ICD-10-CM | POA: Diagnosis not present

## 2019-05-10 DIAGNOSIS — M47812 Spondylosis without myelopathy or radiculopathy, cervical region: Secondary | ICD-10-CM

## 2019-05-10 NOTE — Assessment & Plan Note (Signed)
Right upper trapezial, no radiculopathy or myelopathy. Adding cervical spondylosis rehab exercises, we can do a series of right trapezial trigger point injections if no better.

## 2019-05-10 NOTE — Assessment & Plan Note (Signed)
Biceps tendinitis now resolved with rehab exercises, return as needed for this.

## 2019-05-10 NOTE — Progress Notes (Signed)
Subjective:    CC: Follow-up  HPI: Ana King returns, she had biceps tendinitis from holding her 13 pound grandson, he is now 17 pounds, this is improved with rehab exercises.  Unfortunately she is starting to have pain in her right trapezius, moderate, persistent, localized without radiation.  I reviewed the past medical history, family history, social history, surgical history, and allergies today and no changes were needed.  Please see the problem list section below in epic for further details.  Past Medical History: Past Medical History:  Diagnosis Date  . Asthma   . Essential hypertension, benign 08/28/2015  . GERD (gastroesophageal reflux disease)    Past Surgical History: Past Surgical History:  Procedure Laterality Date  . ABDOMINAL HYSTERECTOMY     Social History: Social History   Socioeconomic History  . Marital status: Married    Spouse name: Greggory Stallion  . Number of children: 2  . Years of education: 10th  . Highest education level: 10th grade  Occupational History  . Occupation: retired    Comment: housewife  Social Needs  . Financial resource strain: Not hard at all  . Food insecurity    Worry: Never true    Inability: Never true  . Transportation needs    Medical: No    Non-medical: No  Tobacco Use  . Smoking status: Former Smoker    Types: Cigarettes    Quit date: 05/21/2014    Years since quitting: 4.9  . Smokeless tobacco: Never Used  Substance and Sexual Activity  . Alcohol use: Never    Alcohol/week: 0.0 standard drinks    Frequency: Never  . Drug use: Never  . Sexual activity: Not Currently  Lifestyle  . Physical activity    Days per week: 0 days    Minutes per session: 0 min  . Stress: Not at all  Relationships  . Social Musician on phone: Twice a week    Gets together: Twice a week    Attends religious service: More than 4 times per year    Active member of club or organization: No    Attends meetings of clubs or  organizations: Never    Relationship status: Married  Other Topics Concern  . Not on file  Social History Narrative   Drinks caffeine daily 3-4 cups of coffee daily. Drinks a lot of water daily.   Family History: Family History  Problem Relation Age of Onset  . Cancer Mother   . Diabetes Maternal Aunt   . Hypertension Maternal Aunt   . Cancer Maternal Grandmother   . Diabetes Maternal Grandmother    Allergies: Allergies  Allergen Reactions  . Penicillins Hives   Medications: See med rec.  Review of Systems: No fevers, chills, night sweats, weight loss, chest pain, or shortness of breath.   Objective:    General: Well Developed, well nourished, and in no acute distress.  Neuro: Alert and oriented x3, extra-ocular muscles intact, sensation grossly intact.  HEENT: Normocephalic, atraumatic, pupils equal round reactive to light, neck supple, no masses, no lymphadenopathy, thyroid nonpalpable.  Skin: Warm and dry, no rashes. Cardiac: Regular rate and rhythm, no murmurs rubs or gallops, no lower extremity edema.  Respiratory: Clear to auscultation bilaterally. Not using accessory muscles, speaking in full sentences. Neck: Negative spurling's Full neck range of motion Grip strength and sensation normal in bilateral hands Strength good C4 to T1 distribution No sensory change to C4 to T1 Reflexes normal Palpable tender trigger points along the right  trapezius.  Impression and Recommendations:    Right shoulder pain Biceps tendinitis now resolved with rehab exercises, return as needed for this.  Cervical spondylosis Right upper trapezial, no radiculopathy or myelopathy. Adding cervical spondylosis rehab exercises, we can do a series of right trapezial trigger point injections if no better.   ___________________________________________ Gwen Her. Dianah Field, M.D., ABFM., CAQSM. Primary Care and Sports Medicine [facility]  Adjunct Professor of  [place] [employer] [facility] of Medicine

## 2019-06-08 ENCOUNTER — Other Ambulatory Visit: Payer: Self-pay | Admitting: Physician Assistant

## 2019-06-08 DIAGNOSIS — J4521 Mild intermittent asthma with (acute) exacerbation: Secondary | ICD-10-CM

## 2019-06-21 ENCOUNTER — Ambulatory Visit (INDEPENDENT_AMBULATORY_CARE_PROVIDER_SITE_OTHER): Payer: Medicare HMO | Admitting: Sports Medicine

## 2019-06-21 ENCOUNTER — Other Ambulatory Visit: Payer: Self-pay

## 2019-06-21 ENCOUNTER — Encounter: Payer: Self-pay | Admitting: Sports Medicine

## 2019-06-21 DIAGNOSIS — M47812 Spondylosis without myelopathy or radiculopathy, cervical region: Secondary | ICD-10-CM | POA: Diagnosis not present

## 2019-06-21 NOTE — Progress Notes (Signed)
  Subjective:    CC: Follow-up  HPI: Cervical spondylosis: Trapezial pain resolved with conservative measures.  I reviewed the past medical history, family history, social history, surgical history, and allergies today and no changes were needed.  Please see the problem list section below in epic for further details.  Past Medical History: Past Medical History:  Diagnosis Date  . Asthma   . Essential hypertension, benign 08/28/2015  . GERD (gastroesophageal reflux disease)    Past Surgical History: Past Surgical History:  Procedure Laterality Date  . ABDOMINAL HYSTERECTOMY     Social History: Social History   Socioeconomic History  . Marital status: Married    Spouse name: Iona Beard  . Number of children: 2  . Years of education: 10th  . Highest education level: 10th grade  Occupational History  . Occupation: retired    Comment: housewife  Social Needs  . Financial resource strain: Not hard at all  . Food insecurity    Worry: Never true    Inability: Never true  . Transportation needs    Medical: No    Non-medical: No  Tobacco Use  . Smoking status: Former Smoker    Types: Cigarettes    Quit date: 05/21/2014    Years since quitting: 5.0  . Smokeless tobacco: Never Used  Substance and Sexual Activity  . Alcohol use: Never    Alcohol/week: 0.0 standard drinks    Frequency: Never  . Drug use: Never  . Sexual activity: Not Currently  Lifestyle  . Physical activity    Days per week: 0 days    Minutes per session: 0 min  . Stress: Not at all  Relationships  . Social Herbalist on phone: Twice a week    Gets together: Twice a week    Attends religious service: More than 4 times per year    Active member of club or organization: No    Attends meetings of clubs or organizations: Never    Relationship status: Married  Other Topics Concern  . Not on file  Social History Narrative   Drinks caffeine daily 3-4 cups of coffee daily. Drinks a lot of water  daily.   Family History: Family History  Problem Relation Age of Onset  . Cancer Mother   . Diabetes Maternal Aunt   . Hypertension Maternal Aunt   . Cancer Maternal Grandmother   . Diabetes Maternal Grandmother    Allergies: Allergies  Allergen Reactions  . Penicillins Hives   Medications: See med rec.  Review of Systems: No fevers, chills, night sweats, weight loss, chest pain, or shortness of breath.   Objective:    General: Well Developed, well nourished, and in no acute distress.  Neuro: Alert and oriented x3, extra-ocular muscles intact, sensation grossly intact.  HEENT: Normocephalic, atraumatic, pupils equal round reactive to light, neck supple, no masses, no lymphadenopathy, thyroid nonpalpable.  Skin: Warm and dry, no rashes. Cardiac: Regular rate and rhythm, no murmurs rubs or gallops, no lower extremity edema.  Respiratory: Clear to auscultation bilaterally. Not using accessory muscles, speaking in full sentences.  Impression and Recommendations:    Cervical spondylosis Right-sided upper trapezial discomfort, resolved with cervical spine rehab exercises, and an occasional neck massage from her husband, return as needed for this.   ___________________________________________ Gwen Her. Dianah Field, M.D., ABFM., CAQSM. Primary Care and Sports Medicine [place] [facility]  Adjunct Professor of [place] [employer] [facility] of Medicine

## 2019-06-21 NOTE — Assessment & Plan Note (Signed)
Right-sided upper trapezial discomfort, resolved with cervical spine rehab exercises, and an occasional neck massage from her husband, return as needed for this.

## 2019-08-05 ENCOUNTER — Other Ambulatory Visit: Payer: Self-pay | Admitting: Physician Assistant

## 2019-08-16 ENCOUNTER — Encounter: Payer: Self-pay | Admitting: Physician Assistant

## 2019-08-16 ENCOUNTER — Telehealth: Payer: Self-pay | Admitting: Neurology

## 2019-08-16 ENCOUNTER — Ambulatory Visit (INDEPENDENT_AMBULATORY_CARE_PROVIDER_SITE_OTHER): Payer: Medicare HMO | Admitting: Physician Assistant

## 2019-08-16 ENCOUNTER — Other Ambulatory Visit: Payer: Self-pay

## 2019-08-16 VITALS — BP 127/62 | HR 88 | Ht 63.0 in | Wt 204.0 lb

## 2019-08-16 DIAGNOSIS — Z6836 Body mass index (BMI) 36.0-36.9, adult: Secondary | ICD-10-CM

## 2019-08-16 DIAGNOSIS — Z1211 Encounter for screening for malignant neoplasm of colon: Secondary | ICD-10-CM

## 2019-08-16 DIAGNOSIS — G478 Other sleep disorders: Secondary | ICD-10-CM | POA: Diagnosis not present

## 2019-08-16 DIAGNOSIS — K219 Gastro-esophageal reflux disease without esophagitis: Secondary | ICD-10-CM

## 2019-08-16 DIAGNOSIS — R0683 Snoring: Secondary | ICD-10-CM | POA: Diagnosis not present

## 2019-08-16 DIAGNOSIS — J4521 Mild intermittent asthma with (acute) exacerbation: Secondary | ICD-10-CM | POA: Diagnosis not present

## 2019-08-16 DIAGNOSIS — Z131 Encounter for screening for diabetes mellitus: Secondary | ICD-10-CM

## 2019-08-16 DIAGNOSIS — Z23 Encounter for immunization: Secondary | ICD-10-CM

## 2019-08-16 DIAGNOSIS — Z1159 Encounter for screening for other viral diseases: Secondary | ICD-10-CM

## 2019-08-16 DIAGNOSIS — E782 Mixed hyperlipidemia: Secondary | ICD-10-CM | POA: Diagnosis not present

## 2019-08-16 DIAGNOSIS — R0602 Shortness of breath: Secondary | ICD-10-CM

## 2019-08-16 DIAGNOSIS — I10 Essential (primary) hypertension: Secondary | ICD-10-CM

## 2019-08-16 DIAGNOSIS — E6609 Other obesity due to excess calories: Secondary | ICD-10-CM

## 2019-08-16 MED ORDER — AMBULATORY NON FORMULARY MEDICATION: 0 refills | Status: DC

## 2019-08-16 MED ORDER — HYDROCHLOROTHIAZIDE 12.5 MG PO TABS: 12.5000 mg | ORAL_TABLET | Freq: Every day | ORAL | 3 refills | Status: DC

## 2019-08-16 NOTE — Patient Instructions (Signed)
Sent cologuard to return for colon cancer screening.  Will be called for sleep study.  Please return for fasting labs.  rx given for shingrix 2 dose series.

## 2019-08-16 NOTE — Telephone Encounter (Signed)
Cologuard order faxed to [phone] with confirmation received. They will contact the patient directly.   

## 2019-08-17 DIAGNOSIS — G478 Other sleep disorders: Secondary | ICD-10-CM | POA: Insufficient documentation

## 2019-08-17 DIAGNOSIS — E6609 Other obesity due to excess calories: Secondary | ICD-10-CM | POA: Insufficient documentation

## 2019-08-17 DIAGNOSIS — R0683 Snoring: Secondary | ICD-10-CM | POA: Insufficient documentation

## 2019-08-17 DIAGNOSIS — Z1159 Encounter for screening for other viral diseases: Secondary | ICD-10-CM | POA: Diagnosis not present

## 2019-08-17 DIAGNOSIS — R7309 Other abnormal glucose: Secondary | ICD-10-CM | POA: Diagnosis not present

## 2019-08-17 DIAGNOSIS — E782 Mixed hyperlipidemia: Secondary | ICD-10-CM | POA: Diagnosis not present

## 2019-08-17 DIAGNOSIS — Z131 Encounter for screening for diabetes mellitus: Secondary | ICD-10-CM | POA: Diagnosis not present

## 2019-08-17 NOTE — Progress Notes (Signed)
Subjective:    Patient ID: Ana King, female    DOB: 05-02-1951, 69 y.o.   MRN: [id number]  HPI Pt is a 69 yo female with asthma, reflux, HTN who presents to the clinic for follow up and medication refill.   She is doing pretty good. For the last month notice more SOB with exertion and easily fatigued. She is active in her home but does not exercise. No wheezing or cough. She does keep 2 babies during the day. She admits to not getting good rest at night. She wakes up and just feels tired. She does snore. No swelling or edema. No cough.   .. Active Ambulatory Problems    Diagnosis Date Noted  . Anxiety state 11/02/2013  . Tobacco abuse 11/02/2013  . GERD (gastroesophageal reflux disease) 11/02/2013  . Hiatal hernia 12/09/2013  . Essential hypertension, benign 08/28/2015  . Contusion 11/13/2015  . 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 Reactive airway disease 06/09/2017  . Former smoker 06/09/2017  . Chronic venous stasis 12/22/2017  . Muscle cramp 12/22/2017  . Edema of left ankle 12/22/2017  . Osteopenia 03/17/2018  . SOB (shortness of breath) on exertion 01/11/2019  . Depressed mood 01/11/2019  . Right shoulder pain 04/12/2019  . Cervical spondylosis 05/10/2019  . Non-restorative sleep 08/17/2019  . Snoring 08/17/2019   Resolved Ambulatory Problems    Diagnosis Date Noted  . COPD (chronic obstructive pulmonary disease) (HCC) 11/02/2013   Past Medical History:  Diagnosis Date  . Asthma       Review of Systems  All other systems reviewed and are negative.      Objective:   Physical Exam Vitals reviewed.  Constitutional:      Appearance: Normal appearance. She is obese.  HENT:     Head: Normocephalic.  Cardiovascular:     Rate and Rhythm: Normal rate and regular rhythm.  Pulmonary:     Effort: Pulmonary effort is normal.     Breath sounds: Normal breath sounds. No wheezing or rhonchi.  Musculoskeletal:     Right lower leg: No edema.     Left lower leg: No  edema.  Neurological:     General: No focal deficit present.     Mental Status: She is alert and oriented to person, place, and time.  Psychiatric:        Mood and Affect: Mood normal.        Behavior: Behavior normal.           Assessment & Plan:  Marland KitchenMarland KitchenCharmian was seen today for hypertension.  Diagnoses and all orders for this visit:  SOB (shortness of breath) on exertion  Mild intermittent reactive airway disease with acute exacerbation  Essential hypertension, benign -     hydrochlorothiazide (HYDRODIURIL) 12.5 MG tablet; Take 1 tablet (12.5 mg total) by mouth daily.  Colon cancer screening -     Cologuard  Screening for diabetes mellitus -     COMPLETE METABOLIC PANEL WITH GFR -     Hemoglobin A1c  Mixed hyperlipidemia -     Lipid Panel w/reflex Direct LDL  Need for hepatitis C screening test -     Hepatitis C Antibody  Gastroesophageal reflux disease, unspecified whether esophagitis present  Need for shingles vaccine -     AMBULATORY NON FORMULARY MEDICATION; shingrx 2 doses to prevent shingles.  Snoring  Non-restorative sleep   Reassured patient that lung exam sounded great.  6 minute walk test stayed above 95 percent. I think SOB is more  fatigue and physical deconditioning. Discussed walking 10 minutes every day.  Concerned about some sleep apnea with snoring/fatigue/obese. Will order sleep study.  Needs labs. Ordered today.  Needs colonoscopy. Declined referral. Agreed to cologuard.  rx for shingrix given.   Follow up in 2 months after CPAP.    Spent 35 minutes with patient.

## 2019-08-18 ENCOUNTER — Encounter: Payer: Self-pay | Admitting: Physician Assistant

## 2019-08-18 DIAGNOSIS — E785 Hyperlipidemia, unspecified: Secondary | ICD-10-CM | POA: Insufficient documentation

## 2019-08-18 LAB — COMPLETE METABOLIC PANEL WITH GFR
AG Ratio: 1.9 (calc) (ref 1.0–2.5)
ALT: 8 U/L (ref 6–29)
AST: 11 U/L (ref 10–35)
Albumin: 4 g/dL (ref 3.6–5.1)
Alkaline phosphatase (APISO): 66 U/L (ref 37–153)
BUN/Creatinine Ratio: 15 (calc) (ref 6–22)
BUN: 16 mg/dL (ref 7–25)
CO2: 24 mmol/L (ref 20–32)
Calcium: 9.4 mg/dL (ref 8.6–10.4)
Chloride: 108 mmol/L (ref 98–110)
Creat: 1.04 mg/dL — ABNORMAL HIGH (ref 0.50–0.99)
GFR, Est African American: 64 mL/min/{1.73_m2} (ref >=60)
GFR, Est Non African American: 56 mL/min/{1.73_m2} — ABNORMAL LOW (ref >=60)
Globulin: 2.1 g/dL (calc) (ref 1.9–3.7)
Glucose, Bld: 93 mg/dL (ref 65–99)
Potassium: 5.1 mmol/L (ref 3.5–5.3)
Sodium: 141 mmol/L (ref 135–146)
Total Bilirubin: 0.4 mg/dL (ref 0.2–1.2)
Total Protein: 6.1 g/dL (ref 6.1–8.1)

## 2019-08-18 LAB — HEMOGLOBIN A1C
Hgb A1c MFr Bld: 5.6 % of total Hgb (ref <5.7)
Mean Plasma Glucose: 114 (calc)
eAG (mmol/L): 6.3 (calc)

## 2019-08-18 LAB — LIPID PANEL W/REFLEX DIRECT LDL
Cholesterol: 200 mg/dL — ABNORMAL HIGH (ref <200)
HDL: 57 mg/dL (ref >=50)
LDL Cholesterol (Calc): 117 mg/dL (calc) — ABNORMAL HIGH
Non-HDL Cholesterol (Calc): 143 mg/dL (calc) — ABNORMAL HIGH (ref <130)
Total CHOL/HDL Ratio: 3.5 (calc) (ref <5.0)
Triglycerides: 138 mg/dL (ref <150)

## 2019-08-18 LAB — HEPATITIS C ANTIBODY
Hepatitis C Ab: NONREACTIVE
SIGNAL TO CUT-OFF: 0.01 (ref <1.00)

## 2019-08-18 NOTE — Progress Notes (Signed)
Call pt:  Sugars are trending up some close to pre-diabetes range. Start watching sugars/carbs. Try to walk 10 minutes a day.   Cholesterol is stable but your age, smoking history, being on blood pressure medications increases your CV risk. Your risk is 9-15 percent in the next 10 years that you will have CV event. I suggest starting a cholesterol medication now. Thoughts?   Liver looks good.  Kidney function is down a little. Make sure staying hydrated. Recheck in 3 months.

## 2019-08-19 MED ORDER — ATORVASTATIN CALCIUM 40 MG PO TABS: 40.0000 mg | ORAL_TABLET | Freq: Every day | ORAL | 3 refills | Status: DC

## 2019-08-19 NOTE — Addendum Note (Signed)
Addended by: [name] on: 08/19/2019 06:32 AM   Modules accepted: Orders

## 2019-08-19 NOTE — Progress Notes (Signed)
Done

## 2019-08-30 ENCOUNTER — Telehealth: Payer: Self-pay | Admitting: Neurology

## 2019-08-30 NOTE — Telephone Encounter (Signed)
Received note from [facility] stating the patient decided not to take the sleep study that was ordered. Jade - FYI.

## 2019-08-30 NOTE — Telephone Encounter (Signed)
Talked with cindy. And she is going to make her mother get sleep study.

## 2019-09-04 DIAGNOSIS — G4730 Sleep apnea, unspecified: Secondary | ICD-10-CM | POA: Diagnosis not present

## 2019-09-20 ENCOUNTER — Encounter: Payer: Self-pay | Admitting: Physician Assistant

## 2019-09-20 ENCOUNTER — Telehealth: Payer: Self-pay | Admitting: Neurology

## 2019-09-20 DIAGNOSIS — G4733 Obstructive sleep apnea (adult) (pediatric): Secondary | ICD-10-CM

## 2019-09-20 NOTE — Telephone Encounter (Signed)
Sleep apnea per Sleep study with [facility]. TO order CPAP per Lesly Rubenstein with [facility]. Order entered and sent to [employer] via [employer]. They will contact patient directly. Patient made aware.

## 2019-09-23 ENCOUNTER — Other Ambulatory Visit: Payer: Self-pay | Admitting: Physician Assistant

## 2019-09-23 MED ORDER — LANSOPRAZOLE 30 MG PO CPDR: 30.0000 mg | DELAYED_RELEASE_CAPSULE | Freq: Every day | ORAL | 3 refills | Status: DC

## 2019-09-23 MED ORDER — OMEPRAZOLE 40 MG PO CPDR: 40.0000 mg | DELAYED_RELEASE_CAPSULE | Freq: Every day | ORAL | 3 refills | Status: DC

## 2019-09-23 NOTE — Progress Notes (Signed)
Replaced prevacid with omepraxzole.

## 2019-10-06 DIAGNOSIS — I10 Essential (primary) hypertension: Secondary | ICD-10-CM | POA: Diagnosis not present

## 2019-10-06 DIAGNOSIS — G4733 Obstructive sleep apnea (adult) (pediatric): Secondary | ICD-10-CM | POA: Diagnosis not present

## 2019-10-12 ENCOUNTER — Other Ambulatory Visit: Payer: Self-pay | Admitting: Physician Assistant

## 2019-10-12 DIAGNOSIS — I10 Essential (primary) hypertension: Secondary | ICD-10-CM

## 2019-10-13 ENCOUNTER — Telehealth: Payer: Self-pay | Admitting: Neurology

## 2019-10-13 NOTE — Telephone Encounter (Signed)
[facility] set up PAP therapy 10/06/2019 Equipment: AirSense 10 Auto Mask: airfit f20 small Pressure setting: 4-20 Follow up needed 11/06/2019-01/04/2020  Notes to be faxed to: [phone]  Follow up scheduled in the window.

## 2019-10-26 ENCOUNTER — Other Ambulatory Visit: Payer: Self-pay | Admitting: Physician Assistant

## 2019-10-26 DIAGNOSIS — J4521 Mild intermittent asthma with (acute) exacerbation: Secondary | ICD-10-CM

## 2019-11-06 DIAGNOSIS — G4733 Obstructive sleep apnea (adult) (pediatric): Secondary | ICD-10-CM | POA: Diagnosis not present

## 2019-11-06 DIAGNOSIS — I10 Essential (primary) hypertension: Secondary | ICD-10-CM | POA: Diagnosis not present

## 2019-11-07 ENCOUNTER — Telehealth: Payer: Self-pay | Admitting: Physician Assistant

## 2019-11-07 DIAGNOSIS — R7989 Other specified abnormal findings of blood chemistry: Secondary | ICD-10-CM

## 2019-11-07 DIAGNOSIS — R7303 Prediabetes: Secondary | ICD-10-CM

## 2019-11-07 DIAGNOSIS — E782 Mixed hyperlipidemia: Secondary | ICD-10-CM

## 2019-11-07 NOTE — Telephone Encounter (Signed)
Labs ordered for tomorrow appt.

## 2019-11-08 ENCOUNTER — Ambulatory Visit (INDEPENDENT_AMBULATORY_CARE_PROVIDER_SITE_OTHER): Payer: Medicare HMO

## 2019-11-08 ENCOUNTER — Ambulatory Visit (INDEPENDENT_AMBULATORY_CARE_PROVIDER_SITE_OTHER): Payer: Medicare HMO | Admitting: Physician Assistant

## 2019-11-08 ENCOUNTER — Other Ambulatory Visit: Payer: Self-pay

## 2019-11-08 VITALS — BP 142/68 | HR 92 | Ht 63.0 in | Wt 200.0 lb

## 2019-11-08 DIAGNOSIS — R197 Diarrhea, unspecified: Secondary | ICD-10-CM | POA: Diagnosis not present

## 2019-11-08 DIAGNOSIS — M79602 Pain in left arm: Secondary | ICD-10-CM | POA: Diagnosis not present

## 2019-11-08 DIAGNOSIS — M25522 Pain in left elbow: Secondary | ICD-10-CM

## 2019-11-08 DIAGNOSIS — G4733 Obstructive sleep apnea (adult) (pediatric): Secondary | ICD-10-CM

## 2019-11-08 DIAGNOSIS — M7989 Other specified soft tissue disorders: Secondary | ICD-10-CM | POA: Diagnosis not present

## 2019-11-08 DIAGNOSIS — W19XXXA Unspecified fall, initial encounter: Secondary | ICD-10-CM

## 2019-11-08 DIAGNOSIS — M79622 Pain in left upper arm: Secondary | ICD-10-CM | POA: Diagnosis not present

## 2019-11-08 DIAGNOSIS — R7303 Prediabetes: Secondary | ICD-10-CM | POA: Diagnosis not present

## 2019-11-08 DIAGNOSIS — R7989 Other specified abnormal findings of blood chemistry: Secondary | ICD-10-CM | POA: Diagnosis not present

## 2019-11-08 DIAGNOSIS — S4992XA Unspecified injury of left shoulder and upper arm, initial encounter: Secondary | ICD-10-CM | POA: Diagnosis not present

## 2019-11-08 DIAGNOSIS — E782 Mixed hyperlipidemia: Secondary | ICD-10-CM | POA: Diagnosis not present

## 2019-11-08 MED ORDER — DICYCLOMINE HCL 10 MG PO CAPS: 10.0000 mg | ORAL_CAPSULE | Freq: Three times a day (TID) | ORAL | 1 refills | Status: DC

## 2019-11-08 NOTE — Progress Notes (Signed)
Subjective:    Patient ID: Ana King, female    DOB: 1951-06-01, 69 y.o.   MRN: [id number]  HPI  Pt is a 69 yo female with recent home sleep study that showed OSA and she is here to follow up.   She is using CPAP and doing well for the last month at least 4 hours and every night. She is doing well. She has noticed more energy and just overall feels better. No concerns or complaints.   She has had diarrhea for the last 2 weeks since easter Sunday. Noone else in the family has diarrhea. No new medication. No abdominal pain or cramping. No melena or hematochezia. immodium helps. No fever, chills, headaches, nausea or vomiting. No GERD.   She also tripped at [employer] a few days ago on a wet floor. She landed on her left side. She is having some left elbow and upper arm pain. She would like evaluated.   .. Active Ambulatory Problems    Diagnosis Date Noted  . Anxiety state 11/02/2013  . Tobacco abuse 11/02/2013  . GERD (gastroesophageal reflux disease) 11/02/2013  . Hiatal hernia 12/09/2013  . Essential hypertension, benign 08/28/2015  . Contusion 11/13/2015  . 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 Reactive airway disease 06/09/2017  . Former smoker 06/09/2017  . Chronic venous stasis 12/22/2017  . Muscle cramp 12/22/2017  . Edema of left ankle 12/22/2017  . Osteopenia 03/17/2018  . SOB (shortness of breath) on exertion 01/11/2019  . Depressed mood 01/11/2019  . Right shoulder pain 04/12/2019  . Cervical spondylosis 05/10/2019  . Non-restorative sleep 08/17/2019  . Snoring 08/17/2019  . Class 2 obesity due to excess calories without serious comorbidity with body mass index (BMI) of 36.0 to 36.9 in adult 08/17/2019  . Hyperlipidemia 08/18/2019  . OSA (obstructive sleep apnea) 09/20/2019  . Diarrhea 11/08/2019   Resolved Ambulatory Problems    Diagnosis Date Noted  . COPD (chronic obstructive pulmonary disease) ([place]) 11/02/2013   Past Medical History:  Diagnosis Date  . Asthma           Review of Systems See HPI.     Objective:   Physical Exam Vitals reviewed.  Constitutional:      Appearance: Normal appearance.  HENT:     Head: Normocephalic.     Right Ear: Tympanic membrane normal.     Left Ear: Tympanic membrane normal.  Cardiovascular:     Rate and Rhythm: Normal rate and regular rhythm.     Pulses: Normal pulses.  Pulmonary:     Effort: Pulmonary effort is normal.     Breath sounds: Normal breath sounds.  Abdominal:     General: Bowel sounds are normal. There is no distension.     Palpations: Abdomen is soft.     Tenderness: There is no abdominal tenderness. There is no right CVA tenderness, left CVA tenderness or guarding.  Musculoskeletal:     Comments: Bruising over left hip.  Tenderness to palpation lateral upper left arm above lateral epicondyle.  NROM of left arm.  Strength left arm 5/5.   Neurological:     General: No focal deficit present.     Mental Status: She is alert and oriented to person, place, and time.  Psychiatric:        Mood and Affect: Mood normal.           Assessment & Plan:  Marland KitchenMarland KitchenBernadetta was seen today for sleep apnea, fall and diarrhea.  Diagnoses and all orders for this  visit:  OSA (obstructive sleep apnea)  Fall, initial encounter -     DG Humerus Left -     DG Elbow Complete Left  Left arm pain -     DG Humerus Left -     DG Elbow Complete Left  Left elbow pain -     DG Humerus Left -     DG Elbow Complete Left  Diarrhea, unspecified type -     dicyclomine (BENTYL) 10 MG capsule; Take 1 capsule (10 mg total) by mouth 4 (four) times daily -  before meals and at bedtime.   Pt has been compliant with using CPAP every night at least 4 hours. She is see improvement in energy and breathing.   Unclear etiology of diarrhea. Does not seem infectious. Bentyl to use for next few days and then start to back off.avoid dairy and gluten for now. Keep BRAT diet for the next week. If persist or changes   Follow up as needed.   xrays after fall. No acute fractures. For contusions ice, NSAIDs, rest.

## 2019-11-08 NOTE — Patient Instructions (Signed)
Food Choices to Help Relieve Diarrhea, Adult When you have diarrhea, the foods you eat and your eating habits are very important. Choosing the right foods and drinks can help:  Relieve diarrhea.  Replace lost fluids and nutrients.  Prevent dehydration. What general guidelines should I follow?  Relieving diarrhea  Choose foods with less than 2 g or .07 oz. of fiber per serving.  Limit fats to less than 8 tsp (38 g or 1.34 oz.) a day.  Avoid the following: ? Foods and beverages sweetened with high-fructose corn syrup, honey, or sugar alcohols such as xylitol, sorbitol, and mannitol. ? Foods that contain a lot of fat or sugar. ? Fried, greasy, or spicy foods. ? High-fiber grains, breads, and cereals. ? Raw fruits and vegetables.  Eat foods that are rich in probiotics. These foods include dairy products such as yogurt and fermented milk products. They help increase healthy bacteria in the stomach and intestines (gastrointestinal tract, or GI tract).  If you have lactose intolerance, avoid dairy products. These may make your diarrhea worse.  Take medicine to help stop diarrhea (antidiarrheal medicine) only as told by your health care provider. Replacing nutrients  Eat small meals or snacks every 3-4 hours.  Eat bland foods, such as white rice, toast, or baked potato, until your diarrhea starts to get better. Gradually reintroduce nutrient-rich foods as tolerated or as told by your health care provider. This includes: ? Well-cooked protein foods. ? Peeled, seeded, and soft-cooked fruits and vegetables. ? Low-fat dairy products.  Take vitamin and mineral supplements as told by your health care provider. Preventing dehydration  Start by sipping water or a special solution to prevent dehydration (oral rehydration solution, ORS). Urine that is clear or pale yellow means that you are getting enough fluid.  Try to drink at least 8-10 cups of fluid each day to help replace lost  fluids.  You may add other liquids in addition to water, such as clear juice or decaffeinated sports drinks, as tolerated or as told by your health care provider.  Avoid drinks with caffeine, such as coffee, tea, or soft drinks.  Avoid alcohol. What foods are recommended?     The items listed may not be a complete list. Talk with your health care provider about what dietary choices are best for you. Grains White rice. White, French, or pita breads (fresh or toasted), including plain rolls, buns, or bagels. White pasta. Saltine, soda, or graham crackers. Pretzels. Low-fiber cereal. Cooked cereals made with water (such as cornmeal, farina, or cream cereals). Plain muffins. Matzo. Melba toast. Zwieback. Vegetables Potatoes (without the skin). Most well-cooked and canned vegetables without skins or seeds. Tender lettuce. Fruits Apple sauce. Fruits canned in juice. Cooked apricots, cherries, grapefruit, peaches, pears, or plums. Fresh bananas and cantaloupe. Meats and other protein foods Baked or boiled chicken. Eggs. Tofu. Fish. Seafood. Smooth nut butters. Ground or well-cooked tender beef, ham, veal, lamb, pork, or poultry. Dairy Plain yogurt, kefir, and unsweetened liquid yogurt. Lactose-free milk, buttermilk, skim milk, or soy milk. Low-fat or nonfat hard cheese. Beverages Water. Low-calorie sports drinks. Fruit juices without pulp. Strained tomato and vegetable juices. Decaffeinated teas. Sugar-free beverages not sweetened with sugar alcohols. Oral rehydration solutions, if approved by your health care provider. Seasoning and other foods Bouillon, broth, or soups made from recommended foods. What foods are not recommended? The items listed may not be a complete list. Talk with your health care provider about what dietary choices are best for you. Grains Whole   grain, whole wheat, bran, or rye breads, rolls, pastas, and crackers. Wild or brown rice. Whole grain or bran cereals. Barley.  Oats and oatmeal. Corn tortillas or taco shells. Granola. Popcorn. Vegetables Raw vegetables. Fried vegetables. Cabbage, broccoli, Brussels sprouts, artichokes, baked beans, beet greens, corn, kale, legumes, peas, sweet potatoes, and yams. Potato skins. Cooked spinach and cabbage. Fruits Dried fruit, including raisins and dates. Raw fruits. Stewed or dried prunes. Canned fruits with syrup. Meat and other protein foods Fried or fatty meats. Deli meats. Chunky nut butters. Nuts and seeds. Beans and lentils. Bacon. Hot dogs. Sausage. Dairy High-fat cheeses. Whole milk, chocolate milk, and beverages made with milk, such as milk shakes. Half-and-half. Cream. sour cream. Ice cream. Beverages Caffeinated beverages (such as coffee, tea, soda, or energy drinks). Alcoholic beverages. Fruit juices with pulp. Prune juice. Soft drinks sweetened with high-fructose corn syrup or sugar alcohols. High-calorie sports drinks. Fats and oils Butter. Cream sauces. Margarine. Salad oils. Plain salad dressings. Olives. Avocados. Mayonnaise. Sweets and desserts Sweet rolls, doughnuts, and sweet breads. Sugar-free desserts sweetened with sugar alcohols such as xylitol and sorbitol. Seasoning and other foods Honey. Hot sauce. Chili powder. Gravy. Cream-based or milk-based soups. Pancakes and waffles. Summary  When you have diarrhea, the foods you eat and your eating habits are very important.  Make sure you get at least 8-10 cups of fluid each day, or enough to keep your urine clear or pale yellow.  Eat bland foods and gradually reintroduce healthy, nutrient-rich foods as tolerated, or as told by your health care provider.  Avoid high-fiber, fried, greasy, or spicy foods. This information is not intended to replace advice given to you by your health care provider. Make sure you discuss any questions you have with your health care provider. Document Revised: 10/28/2018 Document Reviewed: 07/04/2016 Elsevier Patient  Education  2020 Elsevier Inc.  

## 2019-11-09 LAB — BASIC METABOLIC PANEL
BUN/Creatinine Ratio: 16 (calc) (ref 6–22)
BUN: 20 mg/dL (ref 7–25)
CO2: 23 mmol/L (ref 20–32)
Calcium: 9.6 mg/dL (ref 8.6–10.4)
Chloride: 107 mmol/L (ref 98–110)
Creat: 1.25 mg/dL — ABNORMAL HIGH (ref 0.50–0.99)
Glucose, Bld: 85 mg/dL (ref 65–99)
Potassium: 4.6 mmol/L (ref 3.5–5.3)
Sodium: 140 mmol/L (ref 135–146)

## 2019-11-09 LAB — HEMOGLOBIN A1C
Hgb A1c MFr Bld: 5.6 % of total Hgb (ref <5.7)
Mean Plasma Glucose: 114 (calc)
eAG (mmol/L): 6.3 (calc)

## 2019-11-09 LAB — LIPID PANEL W/REFLEX DIRECT LDL
Cholesterol: 133 mg/dL (ref <200)
HDL: 49 mg/dL — ABNORMAL LOW (ref >=50)
LDL Cholesterol (Calc): 66 mg/dL (calc)
Non-HDL Cholesterol (Calc): 84 mg/dL (calc) (ref <130)
Total CHOL/HDL Ratio: 2.7 (calc) (ref <5.0)
Triglycerides: 93 mg/dL (ref <150)

## 2019-11-09 NOTE — Progress Notes (Signed)
No fractures just contusion. Ice and ibuprofen.

## 2019-11-09 NOTE — Telephone Encounter (Signed)
Call pt: LDL perfect. HDL down some. Good fats and exercise can increase HDL.  A1C perfect! Serum creatinine is up from 2 months ago. I would avoid any ibuprofen due to this. Stick with tylenol. Keep hydrated. Keep BP a little better controlled. I would like under 130/80 which you were not there at last visit. Can you keep log at home and in next month report reading. We can take average and figure out if we need to make some adjustments to medication.   Certainly diarrhea could be causing it to be a little more elevated right now.   Recheck just bmp in  76months.

## 2019-11-13 ENCOUNTER — Encounter: Payer: Self-pay | Admitting: Physician Assistant

## 2019-11-14 NOTE — Progress Notes (Signed)
Note faxed to [facility] at [phone] with confirmation received.

## 2019-11-15 ENCOUNTER — Ambulatory Visit: Payer: Medicare HMO | Admitting: Physician Assistant

## 2019-11-22 ENCOUNTER — Ambulatory Visit (INDEPENDENT_AMBULATORY_CARE_PROVIDER_SITE_OTHER): Payer: Medicare HMO

## 2019-11-22 ENCOUNTER — Other Ambulatory Visit: Payer: Self-pay

## 2019-11-22 ENCOUNTER — Ambulatory Visit (INDEPENDENT_AMBULATORY_CARE_PROVIDER_SITE_OTHER): Payer: Medicare HMO | Admitting: Sports Medicine

## 2019-11-22 DIAGNOSIS — M47812 Spondylosis without myelopathy or radiculopathy, cervical region: Secondary | ICD-10-CM

## 2019-11-22 DIAGNOSIS — M542 Cervicalgia: Secondary | ICD-10-CM

## 2019-11-22 MED ORDER — TRIAZOLAM 0.25 MG PO TABS: ORAL_TABLET | ORAL | 0 refills | Status: DC

## 2019-11-22 MED ORDER — TRAMADOL HCL 50 MG PO TABS: 50.0000 mg | ORAL_TABLET | Freq: Three times a day (TID) | ORAL | 0 refills | Status: DC | PRN

## 2019-11-22 NOTE — Progress Notes (Signed)
    Procedures performed today:    None.  Independent interpretation of notes and tests performed by another provider:   None.  Brief History, Exam, Impression, and Recommendations:    Cervical spondylosis Ana King returns, she is a pleasant 69 year old female, we treated her back in December 2020 for some right upper trapezial discomfort that resolved with conservative treatment. Unfortunately she is having recurrence of pain, bilateral trapezial, worse with prolonged downgaze, nothing overtly radicular. At this point we will get aggressive, adding tramadol, she cannot use NSAIDs at this point for medical reasons. X-rays today, MRI this weekend, follow-up for epidural planning. She does have claustrophobia so adding triazolam, she will have a driver.    ___________________________________________ Ihor Austin. Benjamin Stain, M.D., ABFM., CAQSM. Primary Care and Sports Medicine [facility]  Adjunct Instructor of Family Medicine  [employer]

## 2019-11-22 NOTE — Assessment & Plan Note (Signed)
Rhylin returns, she is a pleasant 69 year old female, we treated her back in December 2020 for some right upper trapezial discomfort that resolved with conservative treatment. Unfortunately she is having recurrence of pain, bilateral trapezial, worse with prolonged downgaze, nothing overtly radicular. At this point we will get aggressive, adding tramadol, she cannot use NSAIDs at this point for medical reasons. X-rays today, MRI this weekend, follow-up for epidural planning. She does have claustrophobia so adding triazolam, she will have a driver.

## 2019-12-06 DIAGNOSIS — G4733 Obstructive sleep apnea (adult) (pediatric): Secondary | ICD-10-CM | POA: Diagnosis not present

## 2019-12-06 DIAGNOSIS — I10 Essential (primary) hypertension: Secondary | ICD-10-CM | POA: Diagnosis not present

## 2019-12-14 ENCOUNTER — Other Ambulatory Visit: Payer: Self-pay | Admitting: Physician Assistant

## 2019-12-14 DIAGNOSIS — I10 Essential (primary) hypertension: Secondary | ICD-10-CM

## 2020-01-02 ENCOUNTER — Telehealth: Payer: Self-pay | Admitting: Physician Assistant

## 2020-01-02 NOTE — Telephone Encounter (Signed)
Ana King called and she is having excessive swelling in her feet that is sometimes painful.  She has not traveled and has been keeping them elevated.  Would you like her to come in for an appointment or is there something she can do to help with the swelling? - CF

## 2020-01-02 NOTE — Telephone Encounter (Signed)
I called patient and gave her information to take 2 pills a day and she is scheduled for Friday at 2:20. - CF

## 2020-01-02 NOTE — Telephone Encounter (Signed)
Ok to take 2 of the 12.5mg  HCTZ tablets but lets schedule appt to talk about swelling later in the week. Ok to double book.

## 2020-01-06 ENCOUNTER — Encounter: Payer: Self-pay | Admitting: Physician Assistant

## 2020-01-06 ENCOUNTER — Ambulatory Visit (INDEPENDENT_AMBULATORY_CARE_PROVIDER_SITE_OTHER): Payer: Medicare HMO | Admitting: Physician Assistant

## 2020-01-06 ENCOUNTER — Other Ambulatory Visit: Payer: Self-pay

## 2020-01-06 VITALS — BP 136/80 | HR 82 | Ht 63.0 in | Wt 200.0 lb

## 2020-01-06 DIAGNOSIS — G4733 Obstructive sleep apnea (adult) (pediatric): Secondary | ICD-10-CM | POA: Diagnosis not present

## 2020-01-06 DIAGNOSIS — I878 Other specified disorders of veins: Secondary | ICD-10-CM | POA: Diagnosis not present

## 2020-01-06 DIAGNOSIS — I10 Essential (primary) hypertension: Secondary | ICD-10-CM | POA: Diagnosis not present

## 2020-01-06 LAB — POCT UA - MICROALBUMIN
Albumin/Creatinine Ratio, Urine, POC: <30
Creatinine, POC: 50 mg/dL
Microalbumin Ur, POC: 10 mg/L

## 2020-01-06 NOTE — Progress Notes (Addendum)
Subjective:    Patient ID: Ana King, female    DOB: 07/11/1951, 69 y.o.   MRN: [id number]  HPI  Ana King is a 69 year old female with PMH of HTN, OSA, Chronic Venous stasis, presenting for swelling in her legs.   She states that her bilateral swelling has been getting worse over the past few weeks. She states it is not too bad at rest, but when she is walking or shopping her feet swell so much she cannot get her shoes on. She has been taking 25MG  of HCTZ since this Tuesday which has brought her swelling down. She denies any adverse side effects of the increased dose.  She denies using compression stockings. She states she is elevating her feet when she is resting. She denies any SOB, orthopnea, chest tightness, chest pain, leg pain. Her feet to ache when swollen.   BP is elevated today at 144/58 today. Patient states that she took her BP readings at home for the past month and her readings have been in the 062-694W systolic and 54-62V diastolic.   .. Active Ambulatory Problems    Diagnosis Date Noted  . Anxiety state 11/02/2013  . Tobacco abuse 11/02/2013  . GERD (gastroesophageal reflux disease) 11/02/2013  . Hiatal hernia 12/09/2013  . Essential hypertension, benign 08/28/2015  . Contusion 11/13/2015  . 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 Reactive airway disease 06/09/2017  . Former smoker 06/09/2017  . Chronic venous stasis 12/22/2017  . Muscle cramp 12/22/2017  . Edema of left ankle 12/22/2017  . Osteopenia 03/17/2018  . SOB (shortness of breath) on exertion 01/11/2019  . Depressed mood 01/11/2019  . Right shoulder pain 04/12/2019  . Cervical spondylosis 05/10/2019  . Non-restorative sleep 08/17/2019  . Snoring 08/17/2019  . Class 2 obesity due to excess calories without serious comorbidity with body mass index (BMI) of 36.0 to 36.9 in adult 08/17/2019  . Hyperlipidemia 08/18/2019  . OSA (obstructive sleep apnea) 09/20/2019  . Diarrhea 11/08/2019   Resolved  Ambulatory Problems    Diagnosis Date Noted  . COPD (chronic obstructive pulmonary disease) ([place]) 11/02/2013   Past Medical History:  Diagnosis Date  . Asthma       Review of Systems  Constitutional: Negative.   HENT: Negative.   Eyes: Negative.   Respiratory: Negative.   Cardiovascular: Positive for leg swelling. Negative for chest pain and palpitations.  Gastrointestinal: Negative.   Genitourinary: Positive for frequency.  Musculoskeletal: Negative.   Skin: Negative.   Neurological: Negative.   Psychiatric/Behavioral: Negative.        Objective:   Physical Exam Constitutional:      Appearance: She is obese.  HENT:     Head: Normocephalic and atraumatic.  Eyes:     Extraocular Movements: Extraocular movements intact.  Cardiovascular:     Rate and Rhythm: Normal rate and regular rhythm.     Pulses: Normal pulses.     Heart sounds: Normal heart sounds.  Pulmonary:     Effort: Pulmonary effort is normal.     Breath sounds: Normal breath sounds.  Musculoskeletal:     Cervical back: Normal range of motion.     Right lower leg: 1+ Pitting Edema present.     Left lower leg: 1+ Pitting Edema present.     Comments: Pedal pulse 2+ bilaterally.   Skin:    General: Skin is warm and dry.  Neurological:     General: No focal deficit present.     Mental Status: She is  alert and oriented to person, place, and time.         Assessment & Plan:   Chronic Venous Stasis - Continue HCTZ 25MG  daily for the next 2 weeks. Can decrease back down to 12.5MG  if swelling improves, or may stay at 25MG .  - Discussed buying compression stockings  - Discussed wearing tennis shoes if doing lots of walking - Continue to elevate feet when sitting -HO given -avoid salt and a lot of processed foods.  -reassuring no SOB.   .. Results for orders placed or performed in visit on 01/06/20  POCT UA - Microalbumin  Result Value Ref Range   Microalbumin Ur, POC 10 mg/L   Creatinine, POC 50  mg/dL   Albumin/Creatinine Ratio, Urine, POC <30      Labs to be done next month, will hold off on ordering CMP until then.   Return in 1 month or sooner if needed.   01/08/20 PA-C, have reviewed and agree with the above documentation in it's entirety.

## 2020-01-06 NOTE — Patient Instructions (Signed)

## 2020-01-11 ENCOUNTER — Telehealth: Payer: Self-pay | Admitting: Neurology

## 2020-01-11 NOTE — Telephone Encounter (Signed)
Patient's recent blood pressures:  11/10/2019: BP 121/62 P 75 11/11/2019:  BP 120/53 P 70 11/12/2019: BP 126/57 P 75 11/13/2019: BP 118/60 P 81 11/14/2019: BP 129/66 P 69 11/21/2019: BP 129/67 P 89 11/25/2019: BP 120/59 P 80 12/06/2019: BP 125/58 P 76

## 2020-01-24 ENCOUNTER — Ambulatory Visit (INDEPENDENT_AMBULATORY_CARE_PROVIDER_SITE_OTHER): Payer: Medicare HMO | Admitting: Sports Medicine

## 2020-01-24 DIAGNOSIS — M722 Plantar fascial fibromatosis: Secondary | ICD-10-CM | POA: Diagnosis not present

## 2020-01-24 NOTE — Assessment & Plan Note (Signed)
Ana King is a very 69 year old female, for the past few weeks she has had severe pain on the plantar aspect of her left foot. Pain is worse with the first few steps in the morning at the end of the day, she does occasionally walk barefoot. She is exquisitely tender at the origin of the plantar fascia. We did perform an injection today due to severe pain, she will avoid barefoot walking, wear athletic shoes, and do some rehab exercises, return to see me in a month.

## 2020-01-24 NOTE — Progress Notes (Signed)
    Procedures performed today:    Procedure: Real-time Ultrasound Guided injection of the left plantar fascia origin Device: Samsung HS60  Verbal informed consent obtained.  Time-out conducted.  Noted no overlying erythema, induration, or other signs of local infection.  Skin prepped in a sterile fashion.  Local anesthesia: Topical Ethyl chloride.  With sterile technique and under real time ultrasound guidance: 1 cc Kenalog 40, 1 cc lidocaine, 1 cc bupivacaine injected easily Completed without difficulty  Pain immediately resolved suggesting accurate placement of the medication.  Advised to call if fevers/chills, erythema, induration, drainage, or persistent bleeding.  Images permanently stored and available for review in the ultrasound unit.  Impression: Technically successful ultrasound guided injection.  Independent interpretation of notes and tests performed by another provider:   None.  Brief History, Exam, Impression, and Recommendations:    Plantar fasciitis, left Danne is a very 69 year old female, for the past few weeks she has had severe pain on the plantar aspect of her left foot. Pain is worse with the first few steps in the morning at the end of the day, she does occasionally walk barefoot. She is exquisitely tender at the origin of the plantar fascia. We did perform an injection today due to severe pain, she will avoid barefoot walking, wear athletic shoes, and do some rehab exercises, return to see me in a month.    ___________________________________________ Ihor Austin. Benjamin Stain, M.D., ABFM., CAQSM. Primary Care and Sports Medicine [facility]  Adjunct Instructor of Family Medicine  [employer]

## 2020-01-31 ENCOUNTER — Encounter: Payer: Self-pay | Admitting: Physician Assistant

## 2020-01-31 ENCOUNTER — Other Ambulatory Visit: Payer: Self-pay

## 2020-01-31 ENCOUNTER — Ambulatory Visit (INDEPENDENT_AMBULATORY_CARE_PROVIDER_SITE_OTHER): Payer: Medicare HMO | Admitting: Physician Assistant

## 2020-01-31 VITALS — BP 155/67 | HR 76 | Temp 98.6°F

## 2020-01-31 DIAGNOSIS — H539 Unspecified visual disturbance: Secondary | ICD-10-CM

## 2020-01-31 DIAGNOSIS — H5711 Ocular pain, right eye: Secondary | ICD-10-CM | POA: Diagnosis not present

## 2020-01-31 DIAGNOSIS — R519 Headache, unspecified: Secondary | ICD-10-CM | POA: Diagnosis not present

## 2020-01-31 DIAGNOSIS — R1110 Vomiting, unspecified: Secondary | ICD-10-CM | POA: Insufficient documentation

## 2020-01-31 DIAGNOSIS — R112 Nausea with vomiting, unspecified: Secondary | ICD-10-CM

## 2020-01-31 MED ORDER — KETOROLAC TROMETHAMINE 60 MG/2ML IM SOLN
60.0000 mg | Freq: Once | INTRAMUSCULAR | Status: AC
  Administered 2020-01-31: 60 mg via INTRAMUSCULAR

## 2020-01-31 NOTE — Progress Notes (Signed)
Subjective:    Patient ID: Ana King, female    DOB: [birth year], 69 y.o.   MRN: [id number]  HPI  Pt is a 69 yo female who presents to the clinic with right eye pain. She thought it felt like a eye lash in her eye on Saturday but by Saturday night was having shooting pains through right side of head. This morning she was vomiting and had a lot of nausea. She reports 10/10 pain right now. No new ongoing medications but did have a burst of prednisone a few weeks ago. She denies any eye discharge but it is watering a lot. Her right eye is very blurry. No hx of migraines.   She did use tramadol last night to dull the pain.   .. Active Ambulatory Problems    Diagnosis Date Noted  . Anxiety state 11/02/2013  . Tobacco abuse 11/02/2013  . GERD (gastroesophageal reflux disease) 11/02/2013  . Hiatal hernia 12/09/2013  . Essential hypertension, benign 08/28/2015  . Contusion 11/13/2015  . 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 Reactive airway disease 06/09/2017  . Former smoker 06/09/2017  . Chronic venous stasis 12/22/2017  . Muscle cramp 12/22/2017  . Edema of left ankle 12/22/2017  . Osteopenia 03/17/2018  . SOB (shortness of breath) on exertion 01/11/2019  . Depressed mood 01/11/2019  . Right shoulder pain 04/12/2019  . Cervical spondylosis 05/10/2019  . Non-restorative sleep 08/17/2019  . Snoring 08/17/2019  . Class 2 obesity due to excess calories without serious comorbidity with body mass index (BMI) of 36.0 to 36.9 in adult 08/17/2019  . Hyperlipidemia 08/18/2019  . OSA (obstructive sleep apnea) 09/20/2019  . Diarrhea 11/08/2019  . Plantar fasciitis, left 01/24/2020  . Acute intractable headache 01/31/2020  . Vision changes 01/31/2020  . Non-intractable vomiting 01/31/2020  . Eye pain, right 01/31/2020   Resolved Ambulatory Problems    Diagnosis Date Noted  . COPD (chronic obstructive pulmonary disease) ([facility]) 11/02/2013   Past Medical History:  Diagnosis Date  . Asthma         Objective:   Physical Exam Vitals reviewed.  Constitutional:      Appearance: Normal appearance.  HENT:     Head: Normocephalic.     Right Ear: Tympanic membrane normal.     Left Ear: Tympanic membrane normal.  Eyes:     Extraocular Movements: Extraocular movements intact.     Pupils: Pupils are equal, round, and reactive to light.     Comments: Right medial eye injected with watery discharge.   Cardiovascular:     Rate and Rhythm: Normal rate.  Neurological:     General: No focal deficit present.     Mental Status: She is alert and oriented to person, place, and time.  Psychiatric:        Mood and Affect: Mood normal.        Behavior: Behavior normal.    fluorescein lamp did not show any corneal abrasion.  Vision 20/40 right eye with glasses.  Vision 20/20 both eyes and left eye with glasses.       Assessment & Plan:  Marland KitchenMarland KitchenMercede was seen today for eye pain.  Diagnoses and all orders for this visit:  Eye pain, right -     ketorolac (TORADOL) injection 60 mg  Non-intractable vomiting with nausea, unspecified vomiting type  Vision changes  Acute intractable headache, unspecified headache type   Unclear etiology of right eye pain acute angle closure glaucoma vs ocular migraine vs corneal abrasion that was not  detected on stain.  Does not appear like conjunctivitis.  Made appt with [facility] for tomorrow.  Given Toradol 60mg  in office to see if responds as would with migraine.  If vision worsening go to ED.   Discussed plan with Dr. and agrees.

## 2020-02-01 ENCOUNTER — Other Ambulatory Visit: Payer: Self-pay | Admitting: Physician Assistant

## 2020-02-01 DIAGNOSIS — H209 Unspecified iridocyclitis: Secondary | ICD-10-CM | POA: Diagnosis not present

## 2020-02-01 MED ORDER — RIZATRIPTAN BENZOATE 10 MG PO TABS
10.0000 mg | ORAL_TABLET | ORAL | 0 refills | Status: DC | PRN
Start: 2020-02-01 — End: 2020-07-31

## 2020-02-01 NOTE — Progress Notes (Signed)
No glaucoma. Eye inflammation. Still has migraine. toradol helped yesterday. Sent maxalt for rescue.

## 2020-02-03 ENCOUNTER — Telehealth: Payer: Self-pay | Admitting: Physician Assistant

## 2020-02-03 ENCOUNTER — Other Ambulatory Visit: Payer: Self-pay | Admitting: Physician Assistant

## 2020-02-03 DIAGNOSIS — E861 Hypovolemia: Secondary | ICD-10-CM | POA: Diagnosis not present

## 2020-02-03 DIAGNOSIS — E869 Volume depletion, unspecified: Secondary | ICD-10-CM | POA: Diagnosis not present

## 2020-02-03 DIAGNOSIS — I10 Essential (primary) hypertension: Secondary | ICD-10-CM | POA: Diagnosis not present

## 2020-02-03 DIAGNOSIS — G4733 Obstructive sleep apnea (adult) (pediatric): Secondary | ICD-10-CM | POA: Diagnosis not present

## 2020-02-03 DIAGNOSIS — E782 Mixed hyperlipidemia: Secondary | ICD-10-CM | POA: Diagnosis not present

## 2020-02-03 DIAGNOSIS — B0230 Zoster ocular disease, unspecified: Secondary | ICD-10-CM | POA: Diagnosis not present

## 2020-02-03 DIAGNOSIS — N179 Acute kidney failure, unspecified: Secondary | ICD-10-CM | POA: Diagnosis not present

## 2020-02-03 DIAGNOSIS — E785 Hyperlipidemia, unspecified: Secondary | ICD-10-CM | POA: Diagnosis not present

## 2020-02-03 DIAGNOSIS — K219 Gastro-esophageal reflux disease without esophagitis: Secondary | ICD-10-CM | POA: Diagnosis not present

## 2020-02-03 DIAGNOSIS — J45909 Unspecified asthma, uncomplicated: Secondary | ICD-10-CM | POA: Diagnosis not present

## 2020-02-03 DIAGNOSIS — R509 Fever, unspecified: Secondary | ICD-10-CM | POA: Diagnosis not present

## 2020-02-03 DIAGNOSIS — J4520 Mild intermittent asthma, uncomplicated: Secondary | ICD-10-CM | POA: Diagnosis not present

## 2020-02-03 DIAGNOSIS — R112 Nausea with vomiting, unspecified: Secondary | ICD-10-CM | POA: Diagnosis not present

## 2020-02-03 DIAGNOSIS — E871 Hypo-osmolality and hyponatremia: Secondary | ICD-10-CM | POA: Diagnosis not present

## 2020-02-03 DIAGNOSIS — R918 Other nonspecific abnormal finding of lung field: Secondary | ICD-10-CM | POA: Diagnosis not present

## 2020-02-03 MED ORDER — VALACYCLOVIR HCL 1 G PO TABS
1000.0000 mg | ORAL_TABLET | Freq: Three times a day (TID) | ORAL | 0 refills | Status: DC
Start: 2020-02-03 — End: 2020-02-20

## 2020-02-03 MED ORDER — HYDROCODONE-ACETAMINOPHEN 5-325 MG PO TABS: 1.0000 | ORAL_TABLET | Freq: Four times a day (QID) | ORAL | 0 refills | Status: AC | PRN

## 2020-02-03 MED ORDER — HYDROCODONE-ACETAMINOPHEN 5-325 MG PO TABS: 1.0000 | ORAL_TABLET | Freq: Four times a day (QID) | ORAL | 0 refills | Status: DC | PRN

## 2020-02-03 NOTE — Telephone Encounter (Signed)
After visit pt broke out in rash over right eye that appears like shingles. Sent antiviral and norco for pain. She was seen by opthamology with no evidence of shingles behind eye. Watch for vision changes.

## 2020-02-09 ENCOUNTER — Telehealth: Payer: Self-pay

## 2020-02-09 NOTE — Telephone Encounter (Signed)
While in the hospital Tonie was taken off HCTZ and Lisinopril due to controlled blood pressure while laying. She was advised to speak with Lesly Rubenstein ask if she should restart the blood pressure medication after release. Her sodium was low when she was admitted into the hospital. She believes she will need the Lisinopril when she is able to be up and walking around. Please advise.

## 2020-02-10 NOTE — Telephone Encounter (Signed)
Patient advised and scheduled.  

## 2020-02-10 NOTE — Telephone Encounter (Signed)
Does she have a way to keep a check on BP? I would start lisinopril back first. In the past she has had problems with some swelling in ankles and another reason she was on HcTZ. For now I would hold that. She needs to be seen for recheck of labs after hospital and BP check Monday aug 2nd.

## 2020-02-20 ENCOUNTER — Ambulatory Visit (INDEPENDENT_AMBULATORY_CARE_PROVIDER_SITE_OTHER): Payer: Medicare HMO | Admitting: Physician Assistant

## 2020-02-20 ENCOUNTER — Other Ambulatory Visit: Payer: Self-pay

## 2020-02-20 ENCOUNTER — Encounter: Payer: Self-pay | Admitting: Physician Assistant

## 2020-02-20 VITALS — BP 127/53 | HR 82 | Temp 98.8°F | Ht 63.0 in | Wt 192.4 lb

## 2020-02-20 DIAGNOSIS — E871 Hypo-osmolality and hyponatremia: Secondary | ICD-10-CM | POA: Diagnosis not present

## 2020-02-20 DIAGNOSIS — N179 Acute kidney failure, unspecified: Secondary | ICD-10-CM | POA: Diagnosis not present

## 2020-02-20 DIAGNOSIS — D649 Anemia, unspecified: Secondary | ICD-10-CM

## 2020-02-20 DIAGNOSIS — B0230 Zoster ocular disease, unspecified: Secondary | ICD-10-CM

## 2020-02-20 DIAGNOSIS — R110 Nausea: Secondary | ICD-10-CM

## 2020-02-20 DIAGNOSIS — I10 Essential (primary) hypertension: Secondary | ICD-10-CM | POA: Diagnosis not present

## 2020-02-20 MED ORDER — HYDROCODONE-ACETAMINOPHEN 5-325 MG PO TABS: 1.0000 | ORAL_TABLET | Freq: Four times a day (QID) | ORAL | 0 refills | Status: DC | PRN

## 2020-02-20 MED ORDER — HYDROCODONE-ACETAMINOPHEN 5-325 MG PO TABS: 1.0000 | ORAL_TABLET | Freq: Four times a day (QID) | ORAL | 0 refills | Status: AC | PRN

## 2020-02-20 MED ORDER — ONDANSETRON 8 MG PO TBDP: 8.0000 mg | ORAL_TABLET | Freq: Three times a day (TID) | ORAL | 3 refills | Status: DC | PRN

## 2020-02-20 MED ORDER — GABAPENTIN 300 MG PO CAPS: ORAL_CAPSULE | ORAL | 0 refills | Status: DC

## 2020-02-20 NOTE — Patient Instructions (Signed)
Increased gabapentin 300mg  twice a day.  norco as needed for pain.  Add back in lisinopril.  Hold HCTZ until labs.  Labs ordered.

## 2020-02-20 NOTE — Progress Notes (Signed)
Subjective:    Patient ID: Ana King, female    DOB: [birth year], 69 y.o.   MRN: [id number]  HPI  Patient is a 69 year old female with recent herpes zoster ophthalmicus who presents to the clinic for hospital follow-up.  She went to the hospital after an episode of few hours of vomiting.  Her hemoglobin, sodium, calcium are all decreased as well as her kidney function was also decreased.  She was taken off lisinopril and HCTZ in the hospital.  Her blood pressure was relatively controlled in the hospital but started to elevate when she got home.  She was instructed to start back on lisinopril.  She has not started back on HCTZ.  She has a few days left of her Valtrex.  She has appointment with ophthalmologist in 1 week.   She feels a little better but she continues to have a lot of pain.  She is on gabapentin 100 mg twice a day as well as Norco every 6 hours.  She has a little blurred vision in the right but no significant vision loss.  Her eyes continue to water and feel irritated. She continues to have no appetite, feels weak, feels nauseated.  She denies any fever, chills, body aches.  .. Active Ambulatory Problems    Diagnosis Date Noted  . Anxiety state 11/02/2013  . Tobacco abuse 11/02/2013  . GERD (gastroesophageal reflux disease) 11/02/2013  . Hiatal hernia 12/09/2013  . Essential hypertension, benign 08/28/2015  . Contusion 11/13/2015  . 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 Reactive airway disease 06/09/2017  . Former smoker 06/09/2017  . Chronic venous stasis 12/22/2017  . Muscle cramp 12/22/2017  . Edema of left ankle 12/22/2017  . Osteopenia 03/17/2018  . SOB (shortness of breath) on exertion 01/11/2019  . Depressed mood 01/11/2019  . Right shoulder pain 04/12/2019  . Cervical spondylosis 05/10/2019  . Non-restorative sleep 08/17/2019  . Snoring 08/17/2019  . Class 2 obesity due to excess calories without serious comorbidity with body mass index (BMI) of 36.0 to 36.9 in  adult 08/17/2019  . Hyperlipidemia 08/18/2019  . OSA (obstructive sleep apnea) 09/20/2019  . Diarrhea 11/08/2019  . Plantar fasciitis, left 01/24/2020  . Acute intractable headache 01/31/2020  . Vision changes 01/31/2020  . Non-intractable vomiting 01/31/2020  . Eye pain, right 01/31/2020  . Herpes zoster with ophthalmic complication 02/03/2020  . Hyponatremia 02/20/2020  . Hypocalcemia 02/20/2020  . Low hemoglobin 02/20/2020  . AKI (acute kidney injury) (H[facility]) 02/21/2020   Resolved Ambulatory Problems    Diagnosis Date Noted  . COPD (chronic obstructive pulmonary disease) ([facility]) 11/02/2013   Past Medical History:  Diagnosis Date  . Asthma     Review of Systems See HPI.     Objective:   Physical Exam Vitals reviewed.  Constitutional:      Appearance: Normal appearance. She is obese.  Eyes:     Extraocular Movements: Extraocular movements intact.     Pupils: Pupils are equal, round, and reactive to light.     Comments: Injected and watery right eye.  One scabbed blister in the eyebrow over right eye.   Cardiovascular:     Rate and Rhythm: Normal rate and regular rhythm.  Pulmonary:     Effort: Pulmonary effort is normal.     Breath sounds: Normal breath sounds.  Musculoskeletal:     Left lower leg: No edema.  Neurological:     General: No focal deficit present.     Mental Status: She is alert and  oriented to person, place, and time.  Psychiatric:        Mood and Affect: Mood normal.           Assessment & Plan:  Marland KitchenMarland KitchenKimbrely was seen today for hospitalization follow-up.  Diagnoses and all orders for this visit:  Herpes zoster with ophthalmic complication, unspecified herpes zoster eye disease -     gabapentin (NEURONTIN) 300 MG capsule; Take one tablet twice a day. -     HYDROcodone-acetaminophen (NORCO/VICODIN) 5-325 MG tablet; Take 1 tablet by mouth every 6 (six) hours as needed for up to 5 days for moderate pain.  Hyponatremia -     BASIC METABOLIC PANEL  WITH GFR  Hypocalcemia -     PTH, Intact and Calcium  Low hemoglobin -     CBC  AKI (acute kidney injury) (HCC) -     BASIC METABOLIC PANEL WITH GFR  Nausea -     ondansetron (ZOFRAN-ODT) 8 MG disintegrating tablet; Take 1 tablet (8 mg total) by mouth every 8 (eight) hours as needed for nausea.  Essential hypertension, benign  Other orders -     Discontinue: HYDROcodone-acetaminophen (NORCO/VICODIN) 5-325 MG tablet; Take 1 tablet by mouth every 6 (six) hours as needed for up to 5 days for moderate pain.   Patient has improved some since hospitalization.  She continues to be nauseated weak and no appetite.  I do think probably some of the nausea is coming from the pain.  Discussed postherpetic pain syndrome.  I did go ahead and give her some Zofran to use as needed.  I did refill her Norco.  I did go ahead and increase her gabapentin.  We will recheck her kidney function, electrolytes, hemoglobin to make sure all trending in the right direction.  Her blood pressure is controlled today.  She is back on lisinopril.  I would hold HCTZ until we get electrolytes and kidney function.  She has no lower extremity edema today but she does report some intermittent leg edema.  Discussed compression stockings and feet elevation.  Reminded to finish complete dose of Valtrex.  [name].PDMP reviewed during this encounter. No concerns.  Discussed pain and long term use can create dependence. Goal is to wean off and continue on gabapentin only for pain.   Continue to follow-up with ophthalmology for clearance of shingles.

## 2020-02-21 ENCOUNTER — Ambulatory Visit: Payer: Medicare HMO | Admitting: Sports Medicine

## 2020-02-21 ENCOUNTER — Encounter: Payer: Self-pay | Admitting: Physician Assistant

## 2020-02-21 DIAGNOSIS — N179 Acute kidney failure, unspecified: Secondary | ICD-10-CM | POA: Insufficient documentation

## 2020-02-21 LAB — CBC
HCT: 33.1 % — ABNORMAL LOW (ref 35.0–45.0)
Hemoglobin: 10.9 g/dL — ABNORMAL LOW (ref 11.7–15.5)
MCH: 28 pg (ref 27.0–33.0)
MCHC: 32.9 g/dL (ref 32.0–36.0)
MCV: 85.1 fL (ref 80.0–100.0)
MPV: 10 fL (ref 7.5–12.5)
Platelets: 361 10*3/uL (ref 140–400)
RBC: 3.89 10*6/uL (ref 3.80–5.10)
RDW: 14.4 % (ref 11.0–15.0)
WBC: 9.7 10*3/uL (ref 3.8–10.8)

## 2020-02-21 LAB — PTH, INTACT AND CALCIUM
Calcium: 9.9 mg/dL (ref 8.6–10.4)
PTH: 42 pg/mL (ref 14–64)

## 2020-02-21 LAB — BASIC METABOLIC PANEL WITH GFR
BUN: 13 mg/dL (ref 7–25)
CO2: 26 mmol/L (ref 20–32)
Calcium: 9.9 mg/dL (ref 8.6–10.4)
Chloride: 99 mmol/L (ref 98–110)
Creat: 0.96 mg/dL (ref 0.50–0.99)
GFR, Est African American: 70 mL/min/{1.73_m2} (ref >=60)
GFR, Est Non African American: 61 mL/min/{1.73_m2} (ref >=60)
Glucose, Bld: 93 mg/dL (ref 65–99)
Potassium: 4.7 mmol/L (ref 3.5–5.3)
Sodium: 134 mmol/L — ABNORMAL LOW (ref 135–146)

## 2020-02-21 NOTE — Progress Notes (Signed)
Hemoglobin and sodium improved from hospital. Sodium still not quite back to normal. Lets hold on adding HCTZ back daily. If you had a really bad day of swelling you could take as needed. Increase iron rich foods and consider a morning iron supplement to get hemoglobin back to normal range.   Kidney function looks great. Calcium great.

## 2020-02-24 ENCOUNTER — Ambulatory Visit: Payer: Medicare HMO | Admitting: Sports Medicine

## 2020-02-27 DIAGNOSIS — H04123 Dry eye syndrome of bilateral lacrimal glands: Secondary | ICD-10-CM | POA: Diagnosis not present

## 2020-02-28 ENCOUNTER — Ambulatory Visit: Payer: Medicare HMO | Admitting: Sports Medicine

## 2020-03-07 ENCOUNTER — Other Ambulatory Visit: Payer: Self-pay | Admitting: Physician Assistant

## 2020-03-07 DIAGNOSIS — G4733 Obstructive sleep apnea (adult) (pediatric): Secondary | ICD-10-CM | POA: Diagnosis not present

## 2020-03-07 DIAGNOSIS — R197 Diarrhea, unspecified: Secondary | ICD-10-CM

## 2020-03-07 DIAGNOSIS — I10 Essential (primary) hypertension: Secondary | ICD-10-CM | POA: Diagnosis not present

## 2020-03-07 MED ORDER — DICYCLOMINE HCL 10 MG PO CAPS: 10.0000 mg | ORAL_CAPSULE | Freq: Three times a day (TID) | ORAL | 1 refills | Status: DC

## 2020-03-12 ENCOUNTER — Other Ambulatory Visit: Payer: Self-pay | Admitting: Physician Assistant

## 2020-03-12 DIAGNOSIS — J4521 Mild intermittent asthma with (acute) exacerbation: Secondary | ICD-10-CM

## 2020-03-20 ENCOUNTER — Other Ambulatory Visit: Payer: Self-pay | Admitting: Neurology

## 2020-03-20 MED ORDER — MECLIZINE HCL 25 MG PO TABS: 25.0000 mg | ORAL_TABLET | Freq: Three times a day (TID) | ORAL | 0 refills | Status: DC | PRN

## 2020-03-27 ENCOUNTER — Other Ambulatory Visit: Payer: Self-pay

## 2020-03-27 ENCOUNTER — Ambulatory Visit (INDEPENDENT_AMBULATORY_CARE_PROVIDER_SITE_OTHER): Payer: Medicare HMO | Admitting: Sports Medicine

## 2020-03-27 DIAGNOSIS — D649 Anemia, unspecified: Secondary | ICD-10-CM

## 2020-03-27 DIAGNOSIS — E538 Deficiency of other specified B group vitamins: Secondary | ICD-10-CM | POA: Diagnosis not present

## 2020-03-27 DIAGNOSIS — M722 Plantar fascial fibromatosis: Secondary | ICD-10-CM | POA: Diagnosis not present

## 2020-03-27 NOTE — Progress Notes (Addendum)
    Procedures performed today:    None.  Independent interpretation of notes and tests performed by another provider:   I reviewed multiple labs including CBC, CMP, urine testing, osmolality from her recent hospitalization.  She was certainly profoundly anemic with severe hyponatremia, no anemia panel performed to determine if this was iron deficiency or anemia of chronic disease.  Brief History, Exam, Impression, and Recommendations:    Plantar fasciitis, left Completely resolved now after a plantar fascia injection at the last visit, she continues to avoid barefoot walking, wears athletic shoes, and she has her rehab exercises.  Low hemoglobin It sounds like Ana King was recently hospitalized for hyponatremia, anemia down into the eights. I do not see that an anemia panel was performed to determine the type of hemoglobin that was normocytic. I am going to add an anemia panel, and if iron deficiency she will need to certainly have a colonoscopy/upper endoscopy.  Vitamin B12 is at the low limit of normal, please add methylmalonic acid levels to blood already in the lab to determine if there is a functional deficiency, iron saturation and ferritin are also at the low limit of normal so I would like her to see gastroenterology for consideration of endoscopy.    ___________________________________________ Ihor Austin. Benjamin Stain, M.D., ABFM., CAQSM. Primary Care and Sports Medicine [facility]  Adjunct Instructor of Family Medicine  [employer]

## 2020-03-27 NOTE — Assessment & Plan Note (Addendum)
It sounds like Ana King was recently hospitalized for hyponatremia, anemia down into the eights. I do not see that an anemia panel was performed to determine the type of hemoglobin that was normocytic. I am going to add an anemia panel, and if iron deficiency she will need to certainly have a colonoscopy/upper endoscopy.  Vitamin B12 is at the low limit of normal, please add methylmalonic acid levels to blood already in the lab to determine if there is a functional deficiency, iron saturation and ferritin are also at the low limit of normal so I would like her to see gastroenterology for consideration of endoscopy.

## 2020-03-27 NOTE — Assessment & Plan Note (Signed)
Completely resolved now after a plantar fascia injection at the last visit, she continues to avoid barefoot walking, wears athletic shoes, and she has her rehab exercises.

## 2020-03-28 NOTE — Addendum Note (Signed)
Addended by: Monica Becton on: 03/28/2020 08:42 AM   Modules accepted: Orders

## 2020-03-31 LAB — CBC
HCT: 32 % — ABNORMAL LOW (ref 35.0–45.0)
Hemoglobin: 10.5 g/dL — ABNORMAL LOW (ref 11.7–15.5)
MCH: 28.7 pg (ref 27.0–33.0)
MCHC: 32.8 g/dL (ref 32.0–36.0)
MCV: 87.4 fL (ref 80.0–100.0)
MPV: 10.1 fL (ref 7.5–12.5)
Platelets: 325 10*3/uL (ref 140–400)
RBC: 3.66 10*6/uL — ABNORMAL LOW (ref 3.80–5.10)
RDW: 15.1 % — ABNORMAL HIGH (ref 11.0–15.0)
WBC: 9.7 10*3/uL (ref 3.8–10.8)

## 2020-03-31 LAB — IRON,TIBC AND FERRITIN PANEL
%SAT: 25 % (calc) (ref 16–45)
Ferritin: 22 ng/mL (ref 16–288)
Iron: 96 ug/dL (ref 45–160)
TIBC: 377 mcg/dL (calc) (ref 250–450)

## 2020-03-31 LAB — TEST AUTHORIZATION

## 2020-03-31 LAB — RETICULOCYTES
ABS Retic: 73200 cells/uL (ref 20000–8000)
Retic Ct Pct: 2 %

## 2020-03-31 LAB — METHYLMALONIC ACID, SERUM: Methylmalonic Acid, Quant: 167 nmol/L (ref 87–318)

## 2020-03-31 LAB — B12 AND FOLATE PANEL
Folate: 13.3 ng/mL
Vitamin B-12: 278 pg/mL (ref 200–1100)

## 2020-04-07 DIAGNOSIS — G4733 Obstructive sleep apnea (adult) (pediatric): Secondary | ICD-10-CM | POA: Diagnosis not present

## 2020-04-07 DIAGNOSIS — I10 Essential (primary) hypertension: Secondary | ICD-10-CM | POA: Diagnosis not present

## 2020-04-10 ENCOUNTER — Ambulatory Visit: Payer: Medicare HMO | Admitting: Physician Assistant

## 2020-04-11 ENCOUNTER — Ambulatory Visit (INDEPENDENT_AMBULATORY_CARE_PROVIDER_SITE_OTHER): Payer: Medicare HMO

## 2020-04-11 ENCOUNTER — Ambulatory Visit (INDEPENDENT_AMBULATORY_CARE_PROVIDER_SITE_OTHER): Payer: Medicare HMO | Admitting: Physician Assistant

## 2020-04-11 ENCOUNTER — Encounter: Payer: Self-pay | Admitting: Physician Assistant

## 2020-04-11 ENCOUNTER — Other Ambulatory Visit: Payer: Self-pay

## 2020-04-11 VITALS — BP 121/48 | HR 77 | Wt 186.0 lb

## 2020-04-11 DIAGNOSIS — J4521 Mild intermittent asthma with (acute) exacerbation: Secondary | ICD-10-CM | POA: Diagnosis not present

## 2020-04-11 DIAGNOSIS — U071 COVID-19: Secondary | ICD-10-CM

## 2020-04-11 DIAGNOSIS — G4733 Obstructive sleep apnea (adult) (pediatric): Secondary | ICD-10-CM

## 2020-04-11 DIAGNOSIS — J1282 Pneumonia due to coronavirus disease 2019: Secondary | ICD-10-CM | POA: Diagnosis not present

## 2020-04-11 DIAGNOSIS — R918 Other nonspecific abnormal finding of lung field: Secondary | ICD-10-CM | POA: Diagnosis not present

## 2020-04-11 MED ORDER — PREDNISONE 20 MG PO TABS: ORAL_TABLET | ORAL | 0 refills | Status: DC

## 2020-04-11 NOTE — Progress Notes (Addendum)
Subjective:    Patient ID: Ana King, female    DOB: 28-Jul-1950, 69 y.o.   MRN: [id number]  Pt is a 69 yo female with reactive airway disease and recent covid 19 infection who c/o continued SOB. Last night she was winded after taking a shower and felt like "an elephant was on her chest". She used her symbicort and symptoms improved. She is using her rescue inhaler 2-4 times a day and does help. She faired pretty well with covid and did not have to go to ED. Denies any CP, fever, headaches, leg swelling, rash, syncope. Past hx of smoking but she has quit.   .. Active Ambulatory Problems    Diagnosis Date Noted  . Anxiety state 11/02/2013  . Tobacco abuse 11/02/2013  . GERD (gastroesophageal reflux disease) 11/02/2013  . Hiatal hernia 12/09/2013  . Essential hypertension, benign 08/28/2015  . Contusion 11/13/2015  . 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 Reactive airway disease 06/09/2017  . Former smoker 06/09/2017  . Chronic venous stasis 12/22/2017  . Muscle cramp 12/22/2017  . Edema of left ankle 12/22/2017  . Osteopenia 03/17/2018  . SOB (shortness of breath) on exertion 01/11/2019  . Depressed mood 01/11/2019  . Right shoulder pain 04/12/2019  . Cervical spondylosis 05/10/2019  . Non-restorative sleep 08/17/2019  . Snoring 08/17/2019  . Class 2 obesity due to excess calories without serious comorbidity with body mass index (BMI) of 36.0 to 36.9 in adult 08/17/2019  . Hyperlipidemia 08/18/2019  . OSA (obstructive sleep apnea) 09/20/2019  . Diarrhea 11/08/2019  . Plantar fasciitis, left 01/24/2020  . Acute intractable headache 01/31/2020  . Vision changes 01/31/2020  . Non-intractable vomiting 01/31/2020  . Eye pain, right 01/31/2020  . Herpes zoster with ophthalmic complication 02/03/2020  . Hyponatremia 02/20/2020  . Hypocalcemia 02/20/2020  . Low hemoglobin 02/20/2020  . AKI (acute kidney injury) (HCC) 02/21/2020   Resolved Ambulatory Problems    Diagnosis Date Noted   . COPD (chronic obstructive pulmonary disease) (HCC) 11/02/2013   Past Medical History:  Diagnosis Date  . Asthma         Review of Systems  Constitutional: Negative for chills and fever.  Respiratory: Positive for cough and shortness of breath. Negative for wheezing.   Cardiovascular: Negative.  Negative for chest pain, palpitations, leg swelling and syncope.  Skin: Negative for rash.  Neurological: Positive for dizziness. Negative for headaches.       Objective:   Physical Exam Cardiovascular:     Rate and Rhythm: Normal rate and regular rhythm.     Pulses: Normal pulses.     Heart sounds: Normal heart sounds.  Pulmonary:     Effort: Pulmonary effort is normal.     Breath sounds: Normal breath sounds.  Chest:     Chest wall: No tenderness.  Musculoskeletal:     Right lower leg: No edema.     Left lower leg: No edema.  Neurological:     General: No focal deficit present.  Psychiatric:        Mood and Affect: Mood normal.           Assessment & Plan:  Marland KitchenMarland KitchenMel was seen today for shortness of breath.  Diagnoses and all orders for this visit:  Mild intermittent reactive airway disease with acute exacerbation -     DG Chest 2 View -     predniSONE (DELTASONE) 20 MG tablet; Take 3 tablets for 3 days, take 2 tablets for 3 days, take 1 tablet for  3 days, take 1/2 for 4 days.  OSA (obstructive sleep apnea)  COVID-19 virus infection -     DG Chest 2 View -     predniSONE (DELTASONE) 20 MG tablet; Take 3 tablets for 3 days, take 2 tablets for 3 days, take 1 tablet for 3 days, take 1/2 for 4 days.  reassuring vitals today.  Prednisone burst taken with food.  X-ray chest to rule out pneumonia since it has been 10 plus days considering empirical treatment with abx if substantial findings.  Continue taking deep breaths hourly and try to move around regularly to avoid clots.  Stay hydrated.  If signs of DVT or PE occur then call us and go to ER

## 2020-04-12 MED ORDER — AZITHROMYCIN 250 MG PO TABS: ORAL_TABLET | ORAL | 0 refills | Status: DC

## 2020-04-12 NOTE — Progress Notes (Signed)
You do have covid pneumonia. Covid pneumonia is viral but since it has been 10 plus days I want to add antibiotic coverage for secondary infection. Continue the prednisone as well.

## 2020-04-12 NOTE — Addendum Note (Signed)
Addended byJomarie Longs on: 04/12/2020 11:04 AM   Modules accepted: Orders

## 2020-04-23 ENCOUNTER — Other Ambulatory Visit: Payer: Self-pay | Admitting: Physician Assistant

## 2020-04-23 DIAGNOSIS — J4521 Mild intermittent asthma with (acute) exacerbation: Secondary | ICD-10-CM

## 2020-04-25 DIAGNOSIS — G4733 Obstructive sleep apnea (adult) (pediatric): Secondary | ICD-10-CM | POA: Diagnosis not present

## 2020-04-25 DIAGNOSIS — I10 Essential (primary) hypertension: Secondary | ICD-10-CM | POA: Diagnosis not present

## 2020-05-01 ENCOUNTER — Ambulatory Visit (INDEPENDENT_AMBULATORY_CARE_PROVIDER_SITE_OTHER): Payer: Medicare HMO | Admitting: Sports Medicine

## 2020-05-01 ENCOUNTER — Other Ambulatory Visit: Payer: Self-pay

## 2020-05-01 DIAGNOSIS — Z23 Encounter for immunization: Secondary | ICD-10-CM | POA: Diagnosis not present

## 2020-05-07 DIAGNOSIS — G4733 Obstructive sleep apnea (adult) (pediatric): Secondary | ICD-10-CM | POA: Diagnosis not present

## 2020-05-07 DIAGNOSIS — I10 Essential (primary) hypertension: Secondary | ICD-10-CM | POA: Diagnosis not present

## 2020-05-12 ENCOUNTER — Other Ambulatory Visit: Payer: Self-pay | Admitting: Physician Assistant

## 2020-05-21 ENCOUNTER — Other Ambulatory Visit: Payer: Self-pay

## 2020-05-21 MED ORDER — ATORVASTATIN CALCIUM 40 MG PO TABS
40.0000 mg | ORAL_TABLET | Freq: Every day | ORAL | 1 refills | Status: DC
Start: 2020-05-21 — End: 2020-10-09

## 2020-05-23 ENCOUNTER — Other Ambulatory Visit: Payer: Self-pay | Admitting: Neurology

## 2020-05-23 DIAGNOSIS — J4521 Mild intermittent asthma with (acute) exacerbation: Secondary | ICD-10-CM

## 2020-05-23 MED ORDER — SYMBICORT 160-4.5 MCG/ACT IN AERO: 2.0000 | INHALATION_SPRAY | Freq: Two times a day (BID) | RESPIRATORY_TRACT | 11 refills | Status: DC

## 2020-06-07 DIAGNOSIS — G4733 Obstructive sleep apnea (adult) (pediatric): Secondary | ICD-10-CM | POA: Diagnosis not present

## 2020-06-07 DIAGNOSIS — I10 Essential (primary) hypertension: Secondary | ICD-10-CM | POA: Diagnosis not present

## 2020-06-16 ENCOUNTER — Other Ambulatory Visit: Payer: Self-pay | Admitting: Physician Assistant

## 2020-06-27 ENCOUNTER — Other Ambulatory Visit: Payer: Self-pay

## 2020-06-27 MED ORDER — LANSOPRAZOLE 30 MG PO CPDR
30.0000 mg | DELAYED_RELEASE_CAPSULE | Freq: Every day | ORAL | 3 refills | Status: DC
Start: 2020-06-27 — End: 2021-04-16

## 2020-07-07 DIAGNOSIS — G4733 Obstructive sleep apnea (adult) (pediatric): Secondary | ICD-10-CM | POA: Diagnosis not present

## 2020-07-07 DIAGNOSIS — I10 Essential (primary) hypertension: Secondary | ICD-10-CM | POA: Diagnosis not present

## 2020-07-13 IMAGING — DX DG ELBOW COMPLETE 3+V*L*
4 series · 4 of 4 positions shown · non-contrast
Comparison: None.

CLINICAL DATA: Fall with pain and swelling

EXAM:
LEFT ELBOW - COMPLETE 3+ VIEW

[elbow ap]
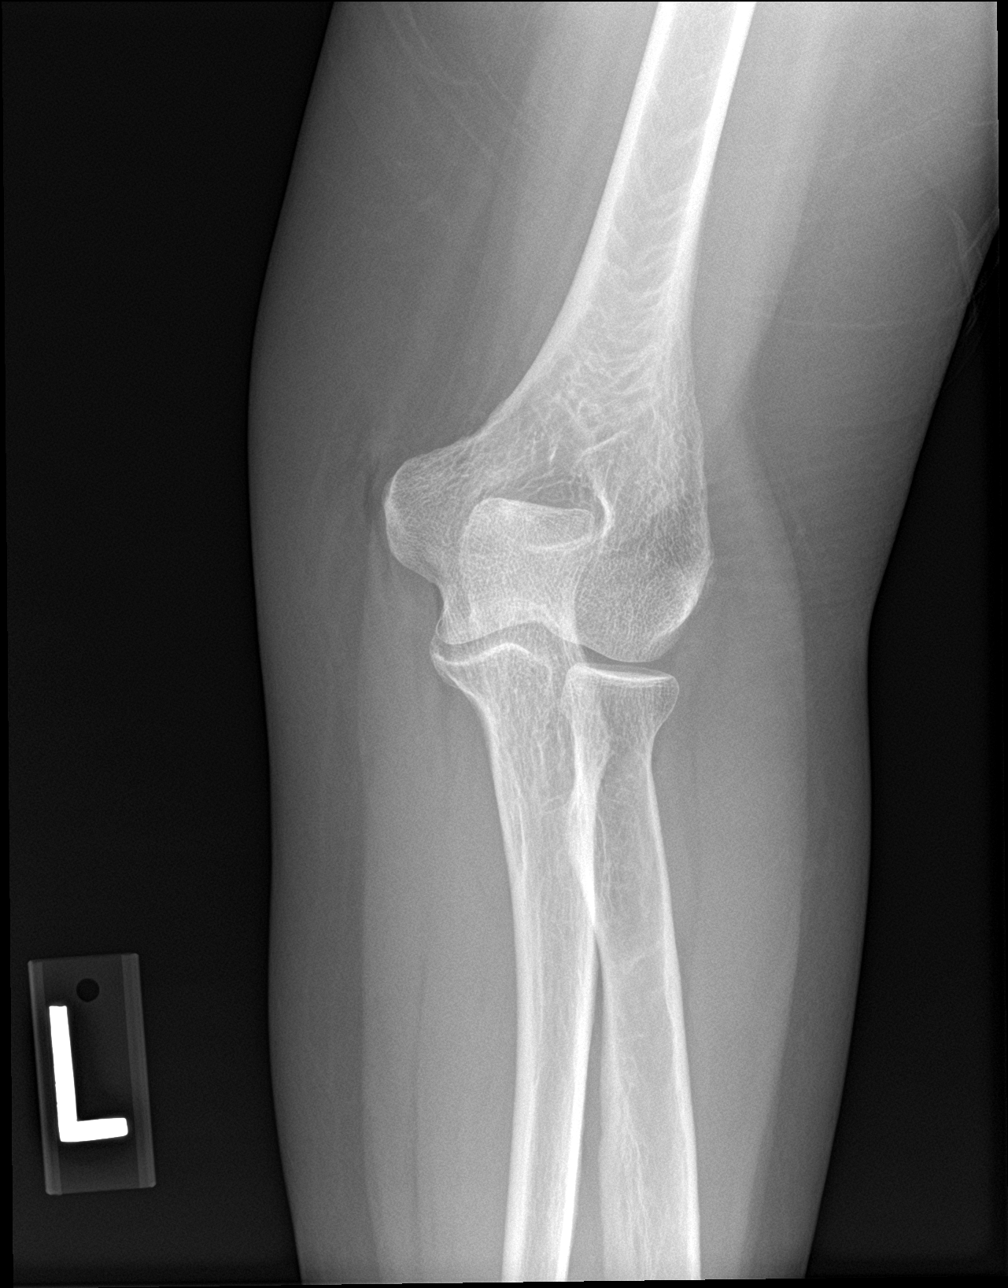

[elbow obl (1 of 2)]
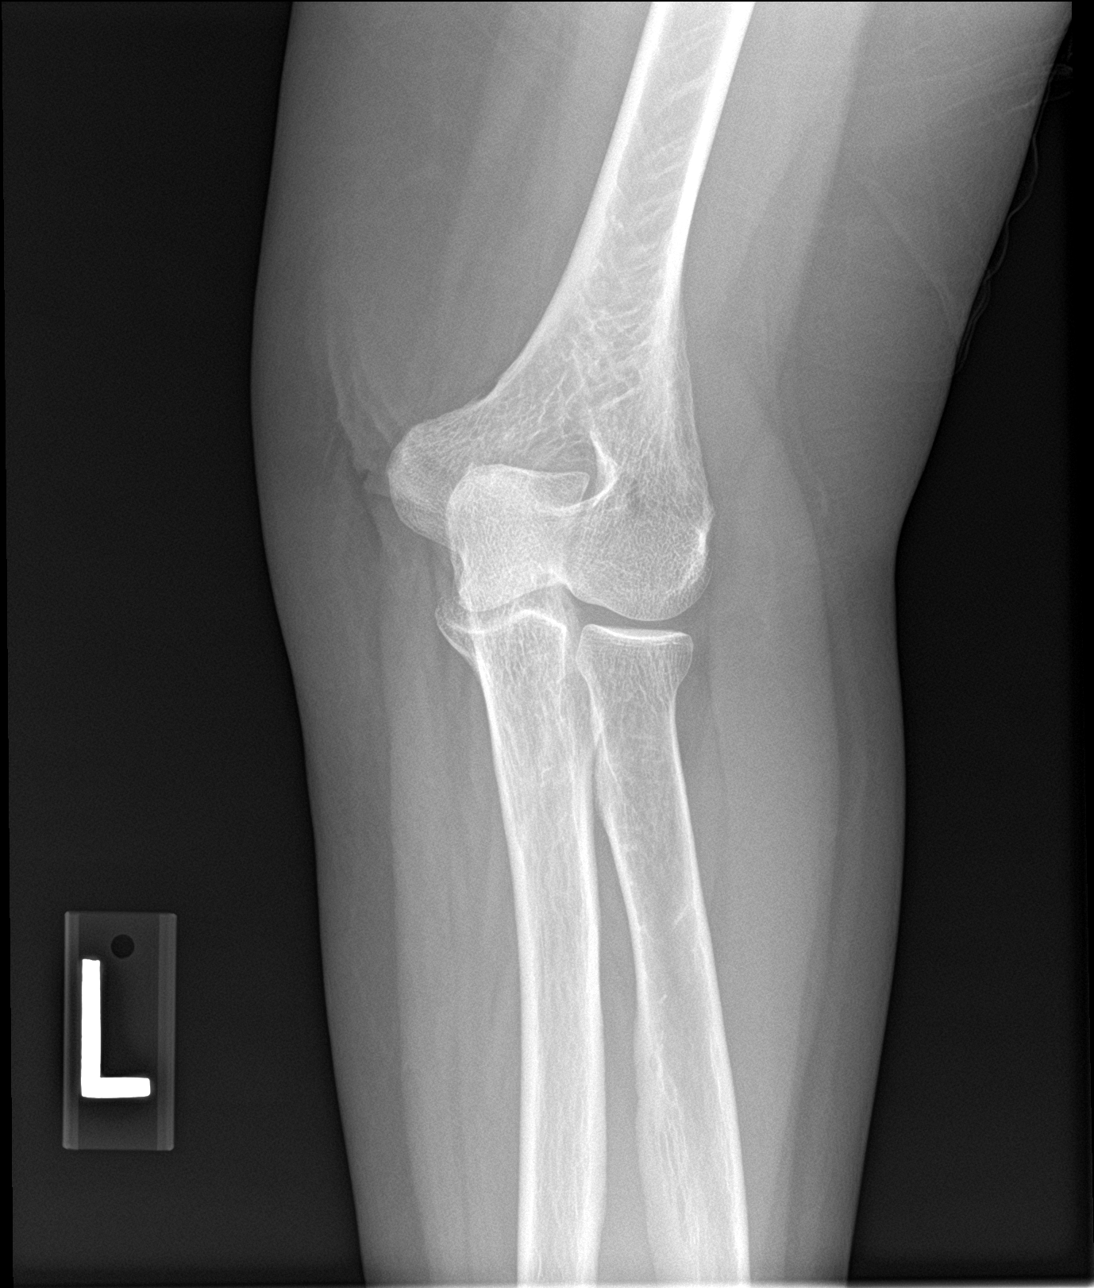

[elbow obl (2 of 2)]
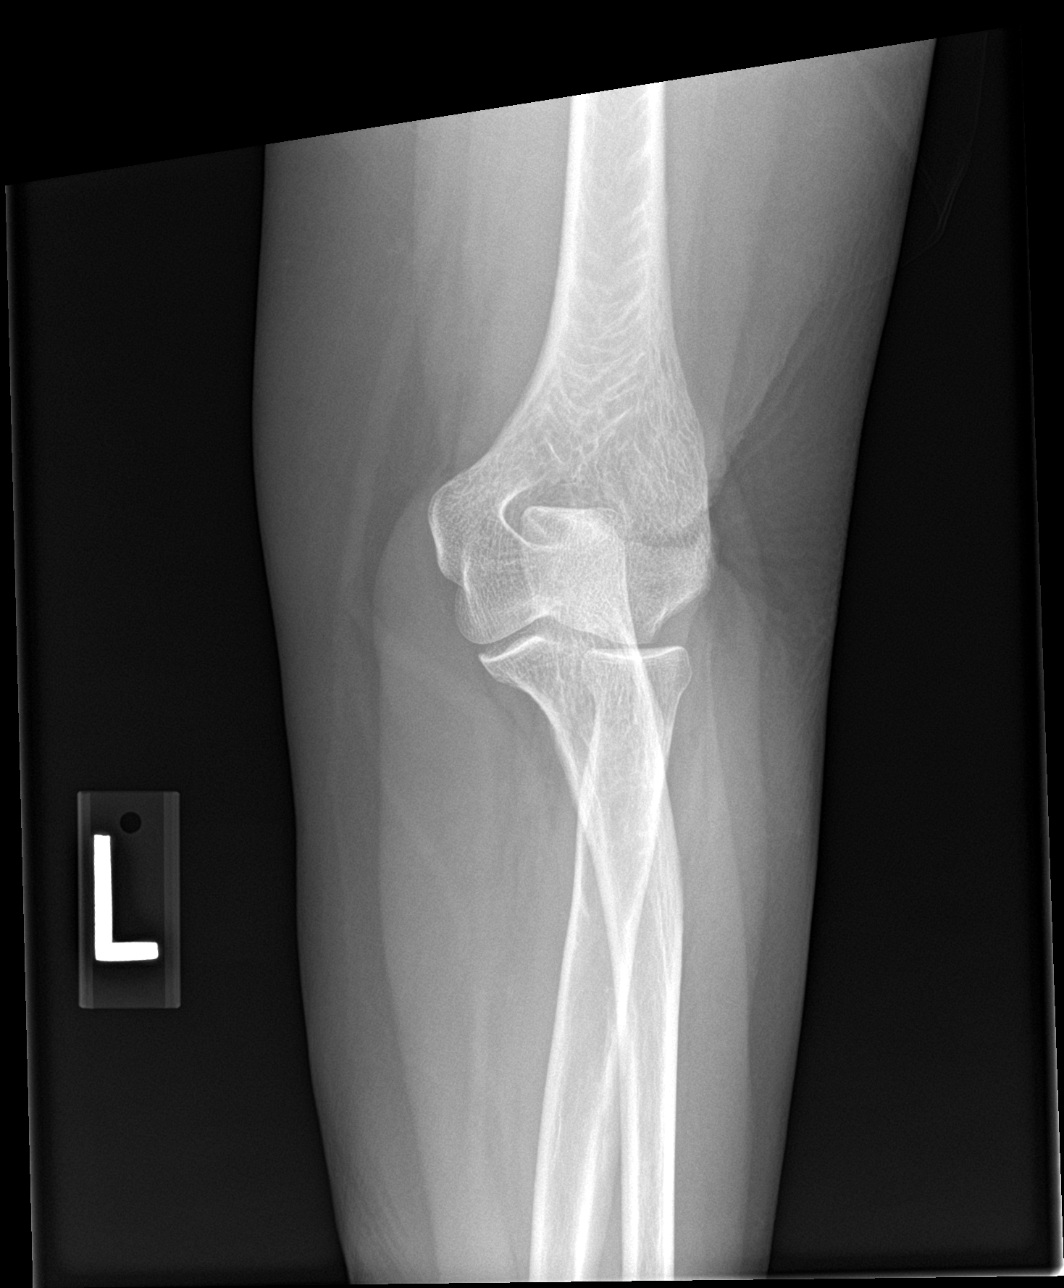

[elbow lat]
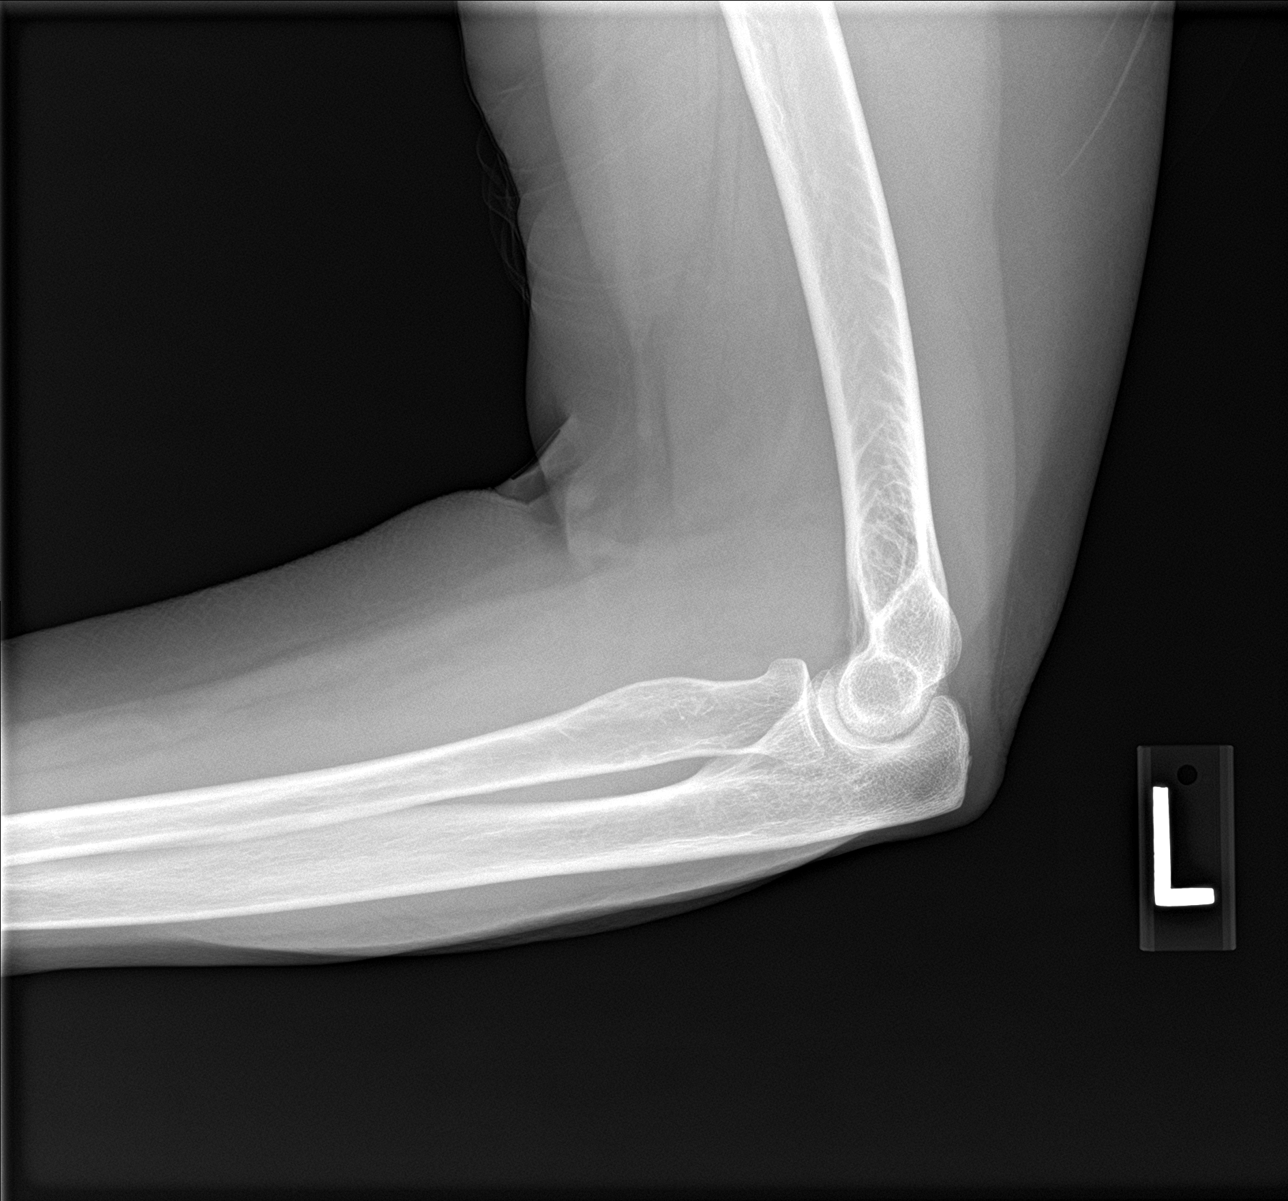

[4 of 4 positions shown; findings below may reference images not displayed]

FINDINGS: There is no evidence of fracture, dislocation, or joint effusion.
There is no evidence of arthropathy or other focal bone abnormality.
Soft tissues are unremarkable.
IMPRESSION: Negative.

## 2020-07-13 IMAGING — DX DG HUMERUS 2V *L*
2 series · 2 of 2 positions shown · non-contrast
Comparison: None.

CLINICAL DATA: Fall with pain and swelling

EXAM:
LEFT HUMERUS - 2+ VIEW

[humerus ap]
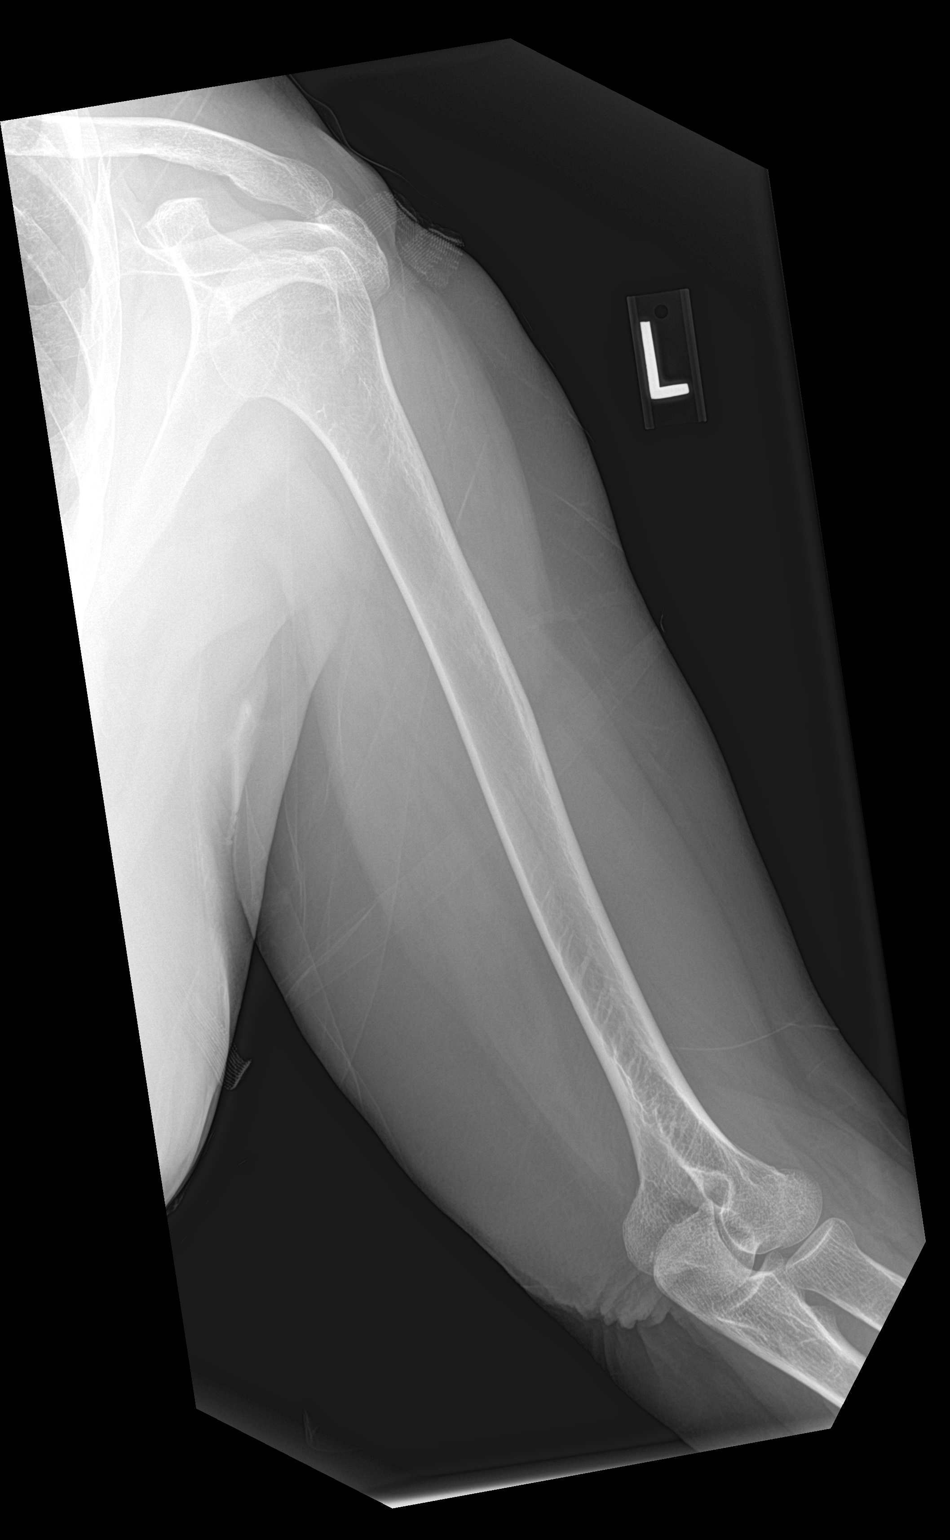

[humerus lat]
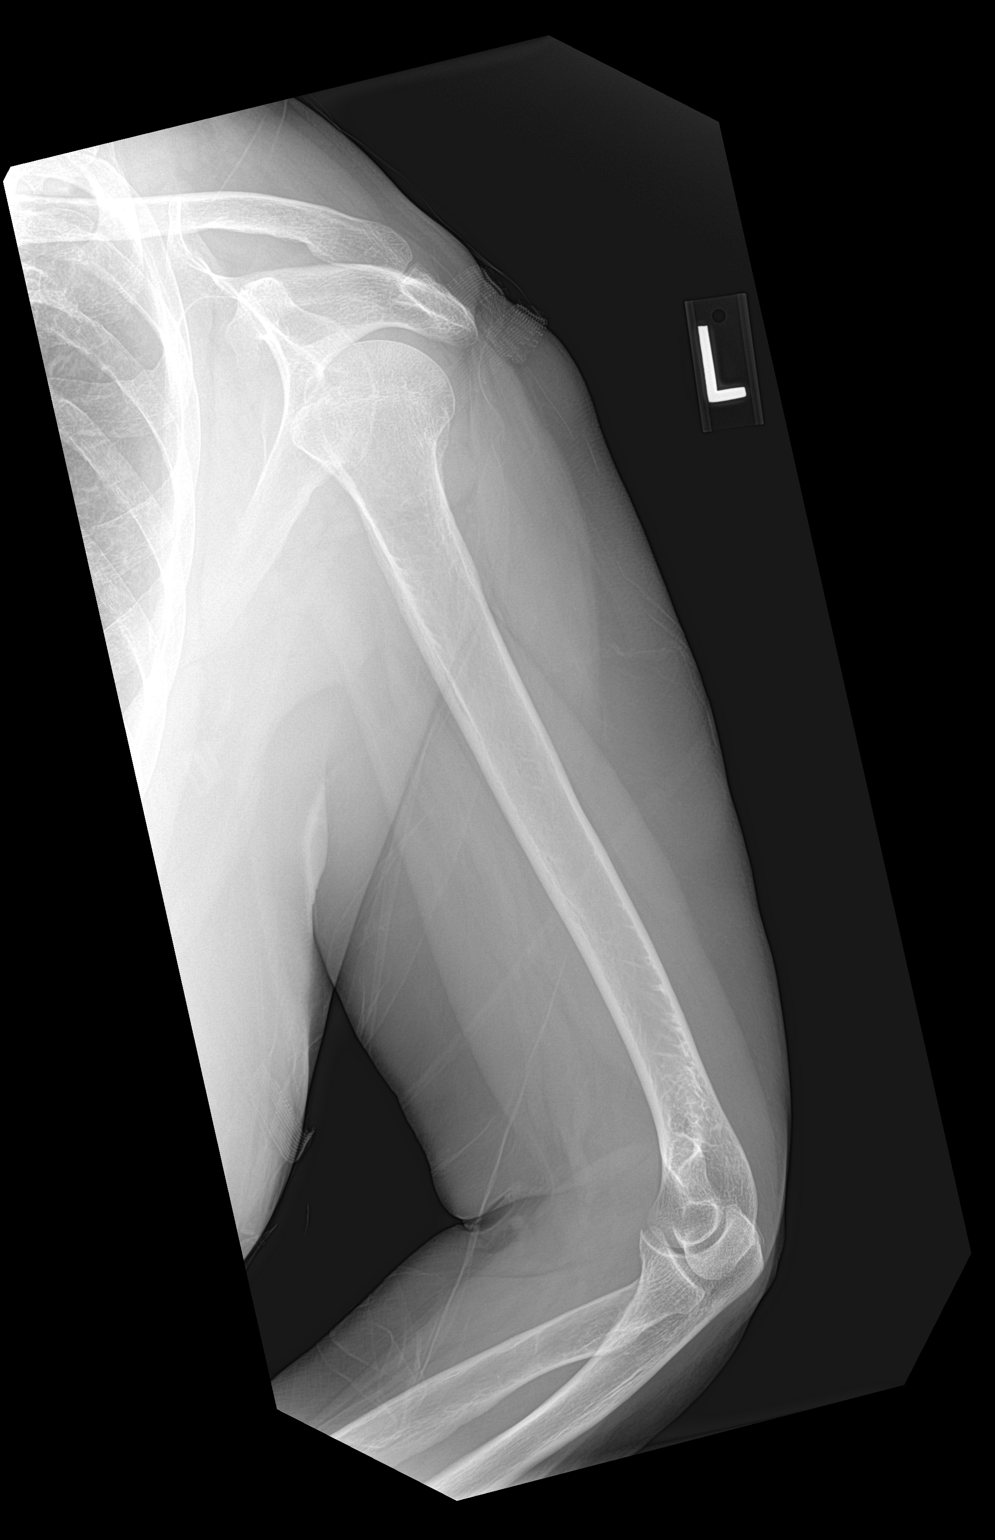

[2 of 2 positions shown; findings below may reference images not displayed]

FINDINGS: There is no evidence of fracture or other focal bone lesions. Soft
tissues are unremarkable.
IMPRESSION: Negative.

## 2020-07-18 ENCOUNTER — Other Ambulatory Visit: Payer: Self-pay | Admitting: Physician Assistant

## 2020-07-18 DIAGNOSIS — I10 Essential (primary) hypertension: Secondary | ICD-10-CM

## 2020-07-25 DIAGNOSIS — I10 Essential (primary) hypertension: Secondary | ICD-10-CM | POA: Diagnosis not present

## 2020-07-25 DIAGNOSIS — G4733 Obstructive sleep apnea (adult) (pediatric): Secondary | ICD-10-CM | POA: Diagnosis not present

## 2020-07-31 ENCOUNTER — Ambulatory Visit (INDEPENDENT_AMBULATORY_CARE_PROVIDER_SITE_OTHER): Payer: Medicare HMO | Admitting: Physician Assistant

## 2020-07-31 ENCOUNTER — Encounter: Payer: Self-pay | Admitting: Physician Assistant

## 2020-07-31 ENCOUNTER — Other Ambulatory Visit: Payer: Self-pay

## 2020-07-31 VITALS — BP 131/60 | HR 74 | Wt 196.3 lb

## 2020-07-31 DIAGNOSIS — J4521 Mild intermittent asthma with (acute) exacerbation: Secondary | ICD-10-CM | POA: Diagnosis not present

## 2020-07-31 DIAGNOSIS — I10 Essential (primary) hypertension: Secondary | ICD-10-CM | POA: Diagnosis not present

## 2020-07-31 MED ORDER — PREDNISONE 20 MG PO TABS: ORAL_TABLET | ORAL | 0 refills | Status: DC

## 2020-07-31 MED ORDER — SYMBICORT 160-4.5 MCG/ACT IN AERO: 2.0000 | INHALATION_SPRAY | Freq: Two times a day (BID) | RESPIRATORY_TRACT | 11 refills | Status: DC

## 2020-07-31 NOTE — Progress Notes (Signed)
   Subjective:    Patient ID: Ana King, female    DOB: Nov 17, 1950, 70 y.o.   MRN: [id number]  HPI  Patient is a 70 year old female with hypertension and reactive airway disease who presents to the clinic for 62-month follow-up.  Overall she is doing pretty good.  She denies any significant issues or concerns.  She is not checking her blood pressure at home.  She denies any chest pain, palpitations, headaches, vision changes.  She does have some intermittent problems with shortness of breath and breathing.  She feels like they have worsened over the last few days.  She feels very tight in her chest.  She does need a refill of Symbicort.  She is using her albuterol inhaler as needed.    Review of Systems  All other systems reviewed and are negative.      Objective:   Physical Exam Vitals reviewed.  Constitutional:      Appearance: Normal appearance.  HENT:     Head: Normocephalic.  Cardiovascular:     Rate and Rhythm: Normal rate and regular rhythm.     Pulses: Normal pulses.  Pulmonary:     Effort: Pulmonary effort is normal.     Breath sounds: Normal breath sounds.     Comments: Scant wheezing bilateral lung base Musculoskeletal:     Right lower leg: No edema.     Left lower leg: No edema.  Neurological:     General: No focal deficit present.     Mental Status: She is alert and oriented to person, place, and time.  Psychiatric:        Mood and Affect: Mood normal.           Assessment & Plan:  Marland KitchenMarland KitchenDiagnoses and all orders for this visit:  Primary hypertension -     COMPLETE METABOLIC PANEL WITH GFR  Mild intermittent reactive airway disease with acute exacerbation -     SYMBICORT 160-4.5 MCG/ACT inhaler; Inhale 2 puffs into the lungs 2 (two) times daily. -     predniSONE (DELTASONE) 20 MG tablet; Take 3 tablets for 3 days, take 2 tablets for 3 days, take 1 tablet for 3 days, take 1/2 for 4 days.   BP looks great. Does not need refill at this time. cmp ordered.    Patient does appear to be having some reactive airway disease with acute flare.  Continue using albuterol inhaler every 2-4 hours as needed.  Continue Symbicort.  Add a prednisone taper.  Make sure you are staying all antihistamine and Singulair.

## 2020-08-01 ENCOUNTER — Encounter: Payer: Self-pay | Admitting: Physician Assistant

## 2020-08-06 ENCOUNTER — Other Ambulatory Visit: Payer: Self-pay | Admitting: Physician Assistant

## 2020-08-06 DIAGNOSIS — J4521 Mild intermittent asthma with (acute) exacerbation: Secondary | ICD-10-CM

## 2020-08-07 DIAGNOSIS — I10 Essential (primary) hypertension: Secondary | ICD-10-CM | POA: Diagnosis not present

## 2020-08-07 DIAGNOSIS — G4733 Obstructive sleep apnea (adult) (pediatric): Secondary | ICD-10-CM | POA: Diagnosis not present

## 2020-08-15 ENCOUNTER — Telehealth: Payer: Self-pay | Admitting: General Practice

## 2020-08-27 ENCOUNTER — Encounter: Payer: Self-pay | Admitting: Physician Assistant

## 2020-08-27 ENCOUNTER — Ambulatory Visit (INDEPENDENT_AMBULATORY_CARE_PROVIDER_SITE_OTHER): Payer: Medicare HMO | Admitting: Physician Assistant

## 2020-08-27 ENCOUNTER — Other Ambulatory Visit: Payer: Self-pay | Admitting: General Practice

## 2020-08-27 DIAGNOSIS — Z1239 Encounter for other screening for malignant neoplasm of breast: Secondary | ICD-10-CM

## 2020-08-27 DIAGNOSIS — Z13820 Encounter for screening for osteoporosis: Secondary | ICD-10-CM

## 2020-08-27 DIAGNOSIS — Z0000 Encounter for general adult medical examination without abnormal findings: Secondary | ICD-10-CM

## 2020-08-27 NOTE — Patient Instructions (Addendum)
Bone Density Test A bone density test uses a type of X-ray to measure the amount of calcium and other minerals in a person's bones. It can measure bone density in the hip and the spine. The test is similar to having a regular X-ray. This test may also be called:  Bone densitometry.  Bone mineral density test.  Dual-energy X-ray absorptiometry (DEXA). You may have this test to:  Diagnose a condition that causes weak or thin bones (osteoporosis).  Screen you for osteoporosis.  Predict your risk for a broken bone (fracture).  Determine how well your osteoporosis treatment is working. Tell a health care provider about:  Any allergies you have.  All medicines you are taking, including vitamins, herbs, eye drops, creams, and over-the-counter medicines.  Any problems you or family members have had with anesthetic medicines.  Any blood disorders you have.  Any surgeries you have had.  Any medical conditions you have.  Whether you are pregnant or may be pregnant.  Any medical tests you have had within the past 14 days that used contrast material. What are the risks? Generally, this is a safe test. However, it does expose you to a small amount of radiation, which can slightly increase your cancer risk. What happens before the test?  Do not take any calcium supplements within the 24 hours before your test.  You will need to remove all metal jewelry, eyeglasses, removable dental appliances, and any other metal objects on your body. What happens during the test?  You will lie down on an exam table. There will be an X-ray generator below you and an imaging device above you.  Other devices, such as boxes or braces, may be used to position your body properly for the scan.  The machine will slowly scan your body. You will need to keep very still while the machine does the scan.  The images will show up on a screen in the room. Images will be examined by a specialist after your  test is finished. The procedure may vary among health care providers and hospitals.   What can I expect after the test? It is up to you to get the results of your test. Ask your health care provider, or the department that is doing the test, when your results will be ready. Summary  A bone density test is an imaging test that uses a type of X-ray to measure the amount of calcium and other minerals in your bones.  The test may be used to diagnose or screen you for a condition that causes weak or thin bones (osteoporosis), predict your risk for a broken bone (fracture), or determine how well your osteoporosis treatment is working.  Do not take any calcium supplements within 24 hours before your test.  Ask your health care provider, or the department that is doing the test, when your results will be ready. This information is not intended to replace advice given to you by your health care provider. Make sure you discuss any questions you have with your health care provider. Document Revised: 12/22/2019 Document Reviewed: 12/22/2019 Elsevier Patient Education  2021 [employer].   Colonoscopy, Adult A colonoscopy is a procedure to look at the entire large intestine. This procedure is done using a long, thin, flexible tube that has a camera on the end. You may have a colonoscopy:  As a part of normal colorectal screening.  If you have certain symptoms, such as: ? A low number of red blood cells  in your blood (anemia). ? Diarrhea that does not go away. ? Pain in your abdomen. ? Blood in your stool. A colonoscopy can help screen for and diagnose medical problems, including:  Tumors.  Extra tissue that grows where mucus forms (polyps).  Inflammation.  Areas of bleeding. Tell your health care provider about:  Any allergies you have.  All medicines you are taking, including vitamins, herbs, eye drops, creams, and over-the-counter medicines.  Any problems you or family members have  had with anesthetic medicines.  Any blood disorders you have.  Any surgeries you have had.  Any medical conditions you have.  Any problems you have had with having bowel movements.  Whether you are pregnant or may be pregnant. What are the risks? Generally, this is a safe procedure. However, problems may occur, including:  Bleeding.  Damage to your intestine.  Allergic reactions to medicines given during the procedure.  Infection. This is rare. What happens before the procedure? Eating and drinking restrictions Follow instructions from your health care provider about eating or drinking restrictions, which may include:  A few days before the procedure: ? Follow a low-fiber diet. ? Avoid nuts, seeds, dried fruit, raw fruits, and vegetables.  1-3 days before the procedure: ? Eat only gelatin dessert or ice pops. ? Drink only clear liquids, such as water, clear juice, clear broth or bouillon, black coffee or tea, or clear soft drinks or sports drinks. ? Avoid liquids that contain red or purple dye.  The day of the procedure: ? Do not eat solid foods. You may continue to drink clear liquids until up to 2 hours before the procedure. ? Do not eat or drink anything starting 2 hours before the procedure, or within the time period that your health care provider recommends. Bowel prep If you were prescribed a bowel prep to take by mouth (orally) to clean out your colon:  Take it as told by your health care provider. Starting the day before your procedure, you will need to drink a large amount of liquid medicine. The liquid will cause you to have many bowel movements of loose stool until your stool becomes almost clear or light green.  If your skin or the opening between the buttocks (anus) gets irritated from diarrhea, you may relieve the irritation using: ? Wipes with medicine in them, such as adult wet wipes with aloe and vitamin E. ? A product to soothe skin, such as petroleum  jelly.  If you vomit while drinking the bowel prep: ? Take a break for up to 60 minutes. ? Begin the bowel prep again. ? Call your health care provider if you keep vomiting or you cannot take the bowel prep without vomiting.  To clean out your colon, you may also be given: ? Laxative medicines. These help you have a bowel movement. ? Instructions for enema use. An enema is liquid medicine injected into your rectum. Medicines Ask your health care provider about:  Changing or stopping your regular medicines or supplements. This is especially important if you are taking iron supplements, diabetes medicines, or blood thinners.  Taking medicines such as aspirin and ibuprofen. These medicines can thin your blood. Do not take these medicines unless your health care provider tells you to take them.  Taking over-the-counter medicines, vitamins, herbs, and supplements. General instructions  Ask your health care provider what steps will be taken to help prevent infection. These may include washing skin with a germ-killing soap.  Plan to have someone take  you home from the hospital or clinic. What happens during the procedure?  An IV will be inserted into one of your veins.  You may be given one or more of the following: ? A medicine to help you relax (sedative). ? A medicine to numb the area (local anesthetic). ? A medicine to make you fall asleep (general anesthetic). This is rarely needed.  You will lie on your side with your knees bent.  The tube will: ? Have oil or gel put on it (be lubricated). ? Be inserted into your anus. ? Be gently eased through all parts of your large intestine.  Air will be sent into your colon to keep it open. This may cause some pressure or cramping.  Images will be taken with the camera and will appear on a screen.  A small tissue sample may be removed to be looked at under a microscope (biopsy). The tissue may be sent to a lab for testing if any signs  of problems are found.  If small polyps are found, they may be removed and checked for cancer cells.  When the procedure is finished, the tube will be removed. The procedure may vary among health care providers and hospitals.   What happens after the procedure?  Your blood pressure, heart rate, breathing rate, and blood oxygen level will be monitored until you leave the hospital or clinic.  You may have a small amount of blood in your stool.  You may pass gas and have mild cramping or bloating in your abdomen. This is caused by the air that was used to open your colon during the exam.  Do not drive for 24 hours after the procedure.  It is up to you to get the results of your procedure. Ask your health care provider, or the department that is doing the procedure, when your results will be ready. Summary  A colonoscopy is a procedure to look at the entire large intestine.  Follow instructions from your health care provider about eating and drinking before the procedure.  If you were prescribed an oral bowel prep to clean out your colon, take it as told by your health care provider.  During the colonoscopy, a flexible tube with a camera on its end is inserted into the anus and then passed into the other parts of the large intestine. This information is not intended to replace advice given to you by your health care provider. Make sure you discuss any questions you have with your health care provider. Document Revised: 01/28/2019 Document Reviewed: 01/28/2019 Elsevier Patient Education  [place]a [place], Female Adopting a healthy lifestyle and getting preventive care are important in promoting health and wellness. Ask your health care provider about:  The right schedule for you to have regular tests and exams.  Things you can do on your own to prevent diseases and keep yourself healthy. What should I know about diet, weight, and exercise? Eat a healthy  diet  Eat a diet that includes plenty of vegetables, fruits, low-fat dairy products, and lean protein.  Do not eat a lot of foods that are high in solid fats, added sugars, or sodium.   Maintain a healthy weight Body mass index (BMI) is used to identify weight problems. It estimates body fat based on height and weight. Your health care provider can help determine your BMI and help you achieve or maintain a healthy weight. Get regular exercise Get regular exercise. This is one of  the most important things you can do for your health. Most adults should:  Exercise for at least 150 minutes each week. The exercise should increase your heart rate and make you sweat (moderate-intensity exercise).  Do strengthening exercises at least twice a week. This is in addition to the moderate-intensity exercise.  Spend less time sitting. Even light physical activity can be beneficial. Watch cholesterol and blood lipids Have your blood tested for lipids and cholesterol at 69 years of age, then have this test every 5 years. Have your cholesterol levels checked more often if:  Your lipid or cholesterol levels are high.  You are older than 69 years of age.  You are at high risk for heart disease. What should I know about cancer screening? Depending on your health history and family history, you may need to have cancer screening at various ages. This may include screening for:  Breast cancer.  Cervical cancer.  Colorectal cancer.  Skin cancer.  Lung cancer. What should I know about heart disease, diabetes, and high blood pressure? Blood pressure and heart disease  High blood pressure causes heart disease and increases the risk of stroke. This is more likely to develop in people who have high blood pressure readings, are of African descent, or are overweight.  Have your blood pressure checked: ? Every 3-5 years if you are 29-95 years of age. ? Every year if you are 22 years old or  older. Diabetes Have regular diabetes screenings. This checks your fasting blood sugar level. Have the screening done:  Once every three years after age 35 if you are at a normal weight and have a low risk for diabetes.  More often and at a younger age if you are overweight or have a high risk for diabetes. What should I know about preventing infection? Hepatitis B If you have a higher risk for hepatitis B, you should be screened for this virus. Talk with your health care provider to find out if you are at risk for hepatitis B infection. Hepatitis C Testing is recommended for:  Everyone born from [birth year] through 1964.  Anyone with known risk factors for hepatitis C. Sexually transmitted infections (STIs)  Get screened for STIs, including gonorrhea and chlamydia, if: ? You are sexually active and are younger than 69 years of age. ? You are older than 69 years of age and your health care provider tells you that you are at risk for this type of infection. ? Your sexual activity has changed since you were last screened, and you are at increased risk for chlamydia or gonorrhea. Ask your health care provider if you are at risk.  Ask your health care provider about whether you are at high risk for HIV. Your health care provider may recommend a prescription medicine to help prevent HIV infection. If you choose to take medicine to prevent HIV, you should first get tested for HIV. You should then be tested every 3 months for as long as you are taking the medicine. Pregnancy  If you are about to stop having your period (premenopausal) and you may become pregnant, seek counseling before you get pregnant.  Take 400 to 800 micrograms (mcg) of folic acid every day if you become pregnant.  Ask for birth control (contraception) if you want to prevent pregnancy. Osteoporosis and menopause Osteoporosis is a disease in which the bones lose minerals and strength with aging. This can result in bone fractures.  If you are 24 years old or older, or  if you are at risk for osteoporosis and fractures, ask your health care provider if you should:  Be screened for bone loss.  Take a calcium or vitamin D supplement to lower your risk of fractures.  Be given hormone replacement therapy (HRT) to treat symptoms of menopause. Follow these instructions at home: Lifestyle  Do not use any products that contain nicotine or tobacco, such as cigarettes, e-cigarettes, and chewing tobacco. If you need help quitting, ask your health care provider.  Do not use street drugs.  Do not share needles.  Ask your health care provider for help if you need support or information about quitting drugs. Alcohol use  Do not drink alcohol if: ? Your health care provider tells you not to drink. ? You are pregnant, may be pregnant, or are planning to become pregnant.  If you drink alcohol: ? Limit how much you use to 0-1 drink a day. ? Limit intake if you are breastfeeding.  Be aware of how much alcohol is in your drink. In the [place] one drink equals one 12 oz bottle of beer (355 mL), one 5 oz glass of wine (148 mL), or one 1 oz glass of hard liquor (44 mL). General instructions  Schedule regular health, dental, and eye exams.  Stay current with your vaccines.  Tell your health care provider if: ? You often feel depressed. ? You have ever been abused or do not feel safe at home. Summary  Adopting a healthy lifestyle and getting preventive care are important in promoting health and wellness.  Follow your health care provider's instructions about healthy diet, exercising, and getting tested or screened for diseases.  Follow your health care provider's instructions on monitoring your cholesterol and blood pressure. This information is not intended to replace advice given to you by your health care provider. Make sure you discuss any questions you have with your health care provider. Document Revised: 06/30/2018  Document Reviewed: 06/30/2018 Elsevier Patient Education  2021 [place] A mammogram is a low energy X-ray of the breasts that is done to check for abnormal changes. This procedure can screen for and detect any changes that may indicate breast cancer. Mammograms are regularly done on women. A man may have a mammogram if he has a lump or swelling in his breast. A mammogram can also identify other changes and variations in the breast, such as:  Inflammation of the breast tissue (mastitis).  An infected area that contains a collection of pus (abscess).  A fluid-filled sac (cyst).  Fibrocystic changes. This is when breast tissue becomes denser, which can make the tissue feel rope-like or uneven under the skin.  Tumors that are not cancerous (benign). Tell a health care provider:  About any allergies you have.  If you have breast implants.  If you have had previous breast disease, biopsy, or surgery.  If you are breastfeeding.  If you are younger than age 45.  If you have a family history of breast cancer.  Whether you are pregnant or may be pregnant. What are the risks? Generally, this is a safe procedure. However, problems may occur, including:  Exposure to radiation. Radiation levels are very low with this test.  The results being misinterpreted.  The need for further tests.  The inability of the mammogram to detect certain cancers. What happens before the procedure?  Schedule your test about 1-2 weeks after your menstrual period if you are still menstruating. This is usually when your breasts are  the least tender.  If you have had a mammogram done at a different facility in the past, get the mammogram X-rays or have them sent to your current exam facility. The new and old images will be compared.  Wash your breasts and underarms on the day of the test.  Do not wear deodorants, perfumes, lotions, or powders anywhere on your body on the day of the  test.  Remove any jewelry from your neck.  Wear clothes that you can change into and out of easily. What happens during the procedure?  You will undress from the waist up and put on a gown that opens in the front.  You will stand in front of the X-ray machine.  Each breast will be placed between two plastic or glass plates. The plates will compress your breast for a few seconds. Try to stay as relaxed as possible during the procedure. This does not cause any harm to your breasts and any discomfort you feel will be very brief.  X-rays will be taken from different angles of each breast. The procedure may vary among health care providers and hospitals.   What happens after the procedure?  The mammogram will be examined by a specialist (radiologist).  You may need to repeat certain parts of the test, depending on the quality of the images. This is commonly done if the radiologist needs a better view of the breast tissue.  You may resume your normal activities.  It is up to you to get the results of your procedure. Ask your health care provider, or the department that is doing the procedure, when your results will be ready. Summary  A mammogram is a low energy X-ray of the breasts that is done to check for abnormal changes. A man may have a mammogram if he has a lump or swelling in his breast.  If you have had a mammogram done at a different facility in the past, get the mammogram X-rays or have them sent to your current exam facility in order to compare them.  Schedule your test about 1-2 weeks after your menstrual period if you are still menstruating.  For this test, each breast will be placed between two plastic or glass plates. The plates will compress your breast for a few seconds.  Ask when your test results will be ready. Make sure you get your test results. This information is not intended to replace advice given to you by your health care provider. Make sure you discuss any  questions you have with your health care provider. Document Revised: 02/25/2018 Document Reviewed: 02/25/2018 Elsevier Patient Education  2021 [employer] Inc[employer]    MEDICARE [place] VISIT [facility] Summary and Written Plan of Care  Ms. Carfagno ,  Thank you for allowing me to perform your Medicare Annual Wellness Visit and for your ongoing commitment to your health.   [facility] & Immunization History [facility]  Topic Date Due  . MAMMOGRAM  03/17/2020  . COLONOSCOPY (Pts 45-39yrs Insurance coverage will need to be confirmed)  11/07/2020 (Originally 01/05/1997)  . COVID-19 Vaccine (3 - Booster for Pfizer series) 10/27/2020  . TETANUS/TDAP  03/02/2028  . INFLUENZA VACCINE  Completed  . DEXA SCAN  Completed  . Hepatitis C Screening  Completed  . PNA vac Low Risk Adult  Completed   Immunization History  Administered Date(s) Administered  . Fluad Quad(high Dose 65+) 03/18/2019, 05/01/2020  . Influenza,inj,Quad PF,6+ Mos 05/24/2014, 05/07/2015, 05/13/2016, 03/24/2017, 05/03/2018  . PFIZER(Purple Top)SARS-COV-2  Vaccination 03/07/2020, 04/28/2020  . Pneumococcal Conjugate-13 03/24/2017  . Pneumococcal Polysaccharide-23 05/03/2018  . Tdap 03/02/2018    These are the patient goals that we discussed: Goals Addressed              This Visit's Progress   .  Patient Stated (pt-stated)        08/27/2020 AWV Goal: Exercise for General Health   Patient will verbalize understanding of the benefits of increased physical activity:  Exercising regularly is important. It will improve your overall fitness, flexibility, and endurance.  Regular exercise also will improve your overall health. It can help you control your weight, reduce stress, and improve your bone density.  Over the next year, patient will increase physical activity as tolerated with a goal of at least 150 minutes of moderate physical activity per week.   You can tell that you are exercising  at a moderate intensity if your heart starts beating faster and you start breathing faster but can still hold a conversation.  Moderate-intensity exercise ideas include:  Walking 1 mile (1.6 km) in about 15 minutes  Biking  Hiking  Golfing  Dancing  Water aerobics  Patient will verbalize understanding of everyday activities that increase physical activity by providing examples like the following: ? Yard work, such as: ? Pushing a Surveyor, mining ? Raking and bagging leaves ? Washing your car ? Pushing a stroller ? Shoveling snow ? Gardening ? Washing windows or floors  Patient will be able to explain general safety guidelines for exercising:   Before you start a new exercise program, talk with your health care provider.  Do not exercise so much that you hurt yourself, feel dizzy, or get very short of breath.  Wear comfortable clothes and wear shoes with good support.  Drink plenty of water while you exercise to prevent dehydration or heat stroke.  Work out until your breathing and your heartbeat get faster.         This is a list of [facility] Items that are overdue or due now: [facility] Due  Topic Date Due  . MAMMOGRAM  03/17/2020   Screening mammography Bone densitometry screening  Colonoscopy   Orders/Referrals Placed Today: No orders of the defined types were placed in this encounter.   Follow-up Plan . Follow-up with [name], PA-C as planned . Dexa scan and mammogram referral will be sent and they will give you a call to schedule.

## 2020-08-27 NOTE — Addendum Note (Signed)
Addended by: Mod[place] Charon on: 08/27/2020 02:56 PM   Modules accepted: Orders

## 2020-08-27 NOTE — Progress Notes (Addendum)
MEDICARE ANNUAL WELLNESS VISIT  08/27/2020  Telephone Visit Disclaimer This Medicare AWV was conducted by telephone due to national recommendations for restrictions regarding the COVID-19 Pandemic (e.g. social distancing).  I verified, using two identifiers, that I am speaking with Ana King or their authorized healthcare agent. I discussed the limitations, risks, security, and privacy concerns of performing an evaluation and management service by telephone and the potential availability of an in-person appointment in the future. The patient expressed understanding and agreed to proceed.  Location of Patient: Home Location of Provider (nurse):  In the office  Subjective:    Ana King is a 70 y.o. female patient of Ana King, Ana Cobb, PA-C who had a [employer] Visit today via telephone. 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 she has 2 children. she reports that she is socially active and does interact with friends/family regularly. she is minimally physically active and enjoys reading.  Patient Care Team: Ana King as PCP - General (Family Medicine)  Advanced Directives 08/27/2020 05/03/2018 11/02/2013  Does Patient Have a Medical Advance Directive? No No Patient does not have advance directive;Patient would like information  Would patient like information on creating a medical advance directive? No - Patient declined Yes (MAU/Ambulatory/Procedural Areas - Information given) -    Hospital Utilization Over the Past 12 Months: # of hospitalizations or ER visits: 1 # of surgeries: 0  Review of Systems    Patient reports that her overall health is unchanged compared to last year.  History obtained from chart review and the patient  Patient Reported Readings (BP, Pulse, CBG, Weight, etc) none  Pain Assessment Pain : No/denies pain     Current Medications & Allergies (verified) Allergies as of 08/27/2020      Reactions    Penicillins Hives      Medication List       Accurate as of August 27, 2020  2:43 PM. If you have any questions, ask your nurse or doctor.        albuterol (2.5 MG/3ML) 0.083% nebulizer solution Commonly known as: PROVENTIL Take 3 mLs (2.5 mg total) by nebulization every 6 (six) hours as needed for wheezing or shortness of breath.   atorvastatin 40 MG tablet Commonly known as: LIPITOR Take 1 tablet (40 mg total) by mouth daily.   dicyclomine 10 MG capsule Commonly known as: Bentyl Take 1 capsule (10 mg total) by mouth 3 (three) times daily before meals.   lansoprazole 30 MG capsule Commonly known as: Prevacid Take 1 capsule (30 mg total) by mouth daily at 12 noon.   lisinopril 20 MG tablet Commonly known as: ZESTRIL TAKE 1 TABLET EVERY DAY   meclizine 25 MG tablet Commonly known as: ANTIVERT Take 1 tablet (25 mg total) by mouth 3 (three) times daily as needed for dizziness.   montelukast 10 MG tablet Commonly known as: SINGULAIR TAKE 1 TABLET AT BEDTIME   predniSONE 20 MG tablet Commonly known as: DELTASONE Take 3 tablets for 3 days, take 2 tablets for 3 days, take 1 tablet for 3 days, take 1/2 for 4 days.   Symbicort 160-4.5 MCG/ACT inhaler Generic drug: budesonide-formoterol Inhale 2 puffs into the lungs 2 (two) times daily.       History (reviewed): Past Medical History:  Diagnosis Date  . Asthma   . Essential hypertension, benign 08/28/2015  . GERD (gastroesophageal reflux disease)    Past Surgical History:  Procedure Laterality Date  . ABDOMINAL HYSTERECTOMY  Family History  Problem Relation Age of Onset  . Cancer Mother   . Diabetes Maternal Aunt   . Hypertension Maternal Aunt   . Cancer Maternal Grandmother   . Diabetes Maternal Grandmother    Social History   Socioeconomic History  . Marital status: Married    Spouse name: Ana King  . Number of children: 2  . Years of education: 10th  . Highest education level: 10th grade   Occupational History  . Occupation: retired    Comment: housewife  Tobacco Use  . Smoking status: Former Smoker    Types: Cigarettes    Quit date: 05/21/2014    Years since quitting: 6.2  . Smokeless tobacco: Never Used  Vaping Use  . Vaping Use: Never used  Substance and Sexual Activity  . Alcohol use: Never    Alcohol/week: 0.0 standard drinks  . Drug use: Never  . Sexual activity: Not Currently  Other Topics Concern  . Not on file  Social History Narrative   Drinks caffeine daily 3-4 cups of coffee daily. Drinks a lot of water daily.   Social Determinants of Health   Financial Resource Strain: Low Risk   . Difficulty of Paying Living Expenses: Not hard at all  Food Insecurity: No Food Insecurity  . Worried About Programme researcher, broadcasting/film/video in the Last Year: Never true  . Ran Out of Food in the Last Year: Never true  Transportation Needs: No Transportation Needs  . Lack of Transportation (Medical): No  . Lack of Transportation (Non-Medical): No  Physical Activity: Inactive  . Days of Exercise per Week: 0 days  . Minutes of Exercise per Session: 0 min  Stress: No Stress Concern Present  . Feeling of Stress : Not at all  Social Connections: Socially Integrated  . Frequency of Communication with Friends and Family: More than three times a week  . Frequency of Social Gatherings with Friends and Family: Twice a week  . Attends Religious Services: More than 4 times per year  . Active Member of Clubs or Organizations: Yes  . Attends Banker Meetings: More than 4 times per year  . Marital Status: Married    Activities of Daily Living In your present state of health, do you have any difficulty performing the following activities: 08/27/2020  Hearing? N  Vision? N  Difficulty concentrating or making decisions? N  Walking or climbing stairs? N  Dressing or bathing? N  Doing errands, shopping? N  Preparing Food and eating ? N  Using the Toilet? N  In the past six  months, have you accidently leaked urine? N  Do you have problems with loss of bowel control? N  Managing your Medications? N  Managing your Finances? N  Housekeeping or managing your Housekeeping? N  Some recent data might be hidden    Patient Education/ Literacy How often do you need to have someone help you when you read instructions, pamphlets, or other written materials from your doctor or pharmacy?: 1 - Never What is the last grade level you completed in school?: 10th grade  Exercise Current Exercise Habits: Home exercise routine, Type of exercise: walking, Time (Minutes): 20, Frequency (Times/Week): 3, Weekly Exercise (Minutes/Week): 60, Intensity: Mild, Exercise limited by: respiratory conditions(s)  Diet Patient reports consuming 1 meals a day and 2 snack(s) a day Patient reports that her primary diet is: Regular Patient reports that she does have regular access to food.   Depression Screen PHQ 2/9 Scores 08/27/2020 01/11/2019 05/03/2018  11/10/2017 03/24/2017 02/23/2017 11/02/2013  PHQ - 2 Score 0 0 0 0 0 1 0  PHQ- 9 Score 0 3 - 0 - - -     Fall Risk Fall Risk  08/27/2020 05/03/2018 11/02/2013  Falls in the past year? 0 No No  Number falls in past yr: 0 - -  Injury with Fall? 0 - -  Risk for fall due to : No Fall Risks - -  Follow up Falls evaluation completed;Education provided - -     Objective:  Ana King seemed alert and oriented and she participated appropriately during our telephone visit.  Blood Pressure Weight BMI  BP Readings from Last 3 Encounters:  07/31/20 131/60  04/11/20 (!) 121/48  02/20/20 (!) 127/53   Wt Readings from Last 3 Encounters:  07/31/20 196 lb 4.8 oz (89 kg)  04/11/20 186 lb (84.4 kg)  02/20/20 192 lb 6.4 oz (87.3 kg)   BMI Readings from Last 1 Encounters:  07/31/20 34.77 kg/m    *Unable to obtain current vital signs, weight, and BMI due to telephone visit type  Hearing/Vision  . [place] did not seem to have difficulty with  hearing/understanding during the telephone conversation . Reports that she has not had a formal eye exam by an eye care professional within the past year . Reports that she has not had a formal hearing evaluation within the past year *Unable to fully assess hearing and vision during telephone visit type  Cognitive Function: 6CIT Screen 08/27/2020 05/03/2018  What Year? 0 points 0 points  What month? 0 points 0 points  What time? 0 points 0 points  Count back from 20 0 points 0 points  Months in reverse 0 points 0 points  Repeat phrase 0 points 0 points  Total Score 0 0   (Normal:0-7, Significant for Dysfunction: >8)  Normal Cognitive Function Screening: Yes   Immunization & Health Maintenance Record Immunization History  Administered Date(s) Administered  . Fluad Quad(high Dose 65+) 03/18/2019, 05/01/2020  . Influenza,inj,Quad PF,6+ Mos 05/24/2014, 05/07/2015, 05/13/2016, 03/24/2017, 05/03/2018  . PFIZER(Purple Top)SARS-COV-2 Vaccination 03/07/2020, 04/28/2020  . Pneumococcal Conjugate-13 03/24/2017  . Pneumococcal Polysaccharide-23 05/03/2018  . Tdap 03/02/2018    Health Maintenance  Topic Date Due  . MAMMOGRAM  03/17/2020  . COLONOSCOPY (Pts 45-43yrs Insurance coverage will need to be confirmed)  11/07/2020 (Originally 01/05/1997)  . COVID-19 Vaccine (3 - Booster for Pfizer series) 10/27/2020  . TETANUS/TDAP  03/02/2028  . INFLUENZA VACCINE  Completed  . DEXA SCAN  Completed  . Hepatitis C Screening  Completed  . PNA vac Low Risk Adult  Completed       Assessment  This is a routine wellness examination for [employer].  Health Maintenance: Due or Overdue Health Maintenance Due  Topic Date Due  . MAMMOGRAM  03/17/2020    Ana King does not need a referral for Community Assistance: Care Management:   no Social Work:    no Prescription Assistance:  no Nutrition/Diabetes Education:  no   Plan:  Personalized Goals Goals Addressed               This Visit's Progress   .  Patient Stated (pt-stated)        08/27/2020 AWV Goal: Exercise for General Health   Patient will verbalize understanding of the benefits of increased physical activity:  Exercising regularly is important. It will improve your overall fitness, flexibility, and endurance.  Regular exercise also will improve your overall health. It can help you  control your weight, reduce stress, and improve your bone density.  Over the next year, patient will increase physical activity as tolerated with a goal of at least 150 minutes of moderate physical activity per week.   You can tell that you are exercising at a moderate intensity if your heart starts beating faster and you start breathing faster but can still hold a conversation.  Moderate-intensity exercise ideas include:  Walking 1 mile (1.6 km) in about 15 minutes  Biking  Hiking  Golfing  Dancing  Water aerobics  Patient will verbalize understanding of everyday activities that increase physical activity by providing examples like the following: ? Yard work, such as: ? Pushing a Surveyor, mining ? Raking and bagging leaves ? Washing your car ? Pushing a stroller ? Shoveling snow ? Gardening ? Washing windows or floors  Patient will be able to explain general safety guidelines for exercising:   Before you start a new exercise program, talk with your health care provider.  Do not exercise so much that you hurt yourself, feel dizzy, or get very short of breath.  Wear comfortable clothes and wear shoes with good support.  Drink plenty of water while you exercise to prevent dehydration or heat stroke.  Work out until your breathing and your heartbeat get faster.       Personalized Health Maintenance & Screening Recommendations  Screening mammography Bone densitometry screening  Colonoscopy  Lung Cancer Screening Recommended: no (Low Dose CT Chest recommended if Age 23-80 years, 30 pack-year currently  smoking OR have quit w/in past 15 years) Hepatitis C Screening recommended: no HIV Screening recommended: no  Advanced Directives: Written information was not prepared per patient's request.  Referrals & Orders No orders of the defined types were placed in this encounter.   Follow-up Plan . Follow-up with [name], PA-C as planned . Dexa scan and mammogram referral will be sent and they will give you a call to schedule.   I have personally reviewed and noted the following in the patient's chart:   . Medical and social history . Use of alcohol, tobacco or illicit drugs  . Current medications and supplements . Functional ability and status . Nutritional status . Physical activity . Advanced directives . List of other physicians . Hospitalizations, surgeries, and ER visits in previous 12 months . Vitals . Screenings to include cognitive, depression, and falls . Referrals and appointments  In addition, I have reviewed and discussed with Ana King certain preventive protocols, quality metrics, and best practice recommendations. A written personalized care plan for preventive services as well as general preventive health recommendations is available and can be mailed to the patient at her request.      Modesto Charon, RN  08/27/2020

## 2020-09-07 DIAGNOSIS — G4733 Obstructive sleep apnea (adult) (pediatric): Secondary | ICD-10-CM | POA: Diagnosis not present

## 2020-09-07 DIAGNOSIS — I10 Essential (primary) hypertension: Secondary | ICD-10-CM | POA: Diagnosis not present

## 2020-09-07 NOTE — Telephone Encounter (Signed)
Documentation only.

## 2020-09-11 ENCOUNTER — Other Ambulatory Visit: Payer: Self-pay | Admitting: Physician Assistant

## 2020-09-11 MED ORDER — RIZATRIPTAN BENZOATE 10 MG PO TBDP: 10.0000 mg | ORAL_TABLET | ORAL | 0 refills | Status: DC | PRN

## 2020-09-11 NOTE — Progress Notes (Signed)
Needs as needed maxalt for migraines.

## 2020-09-26 ENCOUNTER — Other Ambulatory Visit: Payer: Self-pay | Admitting: Physician Assistant

## 2020-09-26 DIAGNOSIS — I10 Essential (primary) hypertension: Secondary | ICD-10-CM

## 2020-10-05 DIAGNOSIS — I10 Essential (primary) hypertension: Secondary | ICD-10-CM | POA: Diagnosis not present

## 2020-10-05 DIAGNOSIS — G4733 Obstructive sleep apnea (adult) (pediatric): Secondary | ICD-10-CM | POA: Diagnosis not present

## 2020-10-08 ENCOUNTER — Other Ambulatory Visit: Payer: Self-pay | Admitting: Physician Assistant

## 2020-10-10 ENCOUNTER — Ambulatory Visit: Payer: Medicare HMO

## 2020-10-17 ENCOUNTER — Other Ambulatory Visit: Payer: Medicare HMO

## 2020-10-17 ENCOUNTER — Ambulatory Visit: Payer: Medicare HMO

## 2020-10-23 DIAGNOSIS — G4733 Obstructive sleep apnea (adult) (pediatric): Secondary | ICD-10-CM | POA: Diagnosis not present

## 2020-10-23 DIAGNOSIS — I10 Essential (primary) hypertension: Secondary | ICD-10-CM | POA: Diagnosis not present

## 2020-11-05 DIAGNOSIS — I10 Essential (primary) hypertension: Secondary | ICD-10-CM | POA: Diagnosis not present

## 2020-11-05 DIAGNOSIS — G4733 Obstructive sleep apnea (adult) (pediatric): Secondary | ICD-10-CM | POA: Diagnosis not present

## 2020-12-17 ENCOUNTER — Other Ambulatory Visit: Payer: Self-pay | Admitting: Physician Assistant

## 2020-12-23 DIAGNOSIS — Z20822 Contact with and (suspected) exposure to covid-19: Secondary | ICD-10-CM | POA: Diagnosis not present

## 2020-12-24 ENCOUNTER — Other Ambulatory Visit: Payer: Self-pay | Admitting: Physician Assistant

## 2020-12-24 DIAGNOSIS — J4521 Mild intermittent asthma with (acute) exacerbation: Secondary | ICD-10-CM

## 2021-01-22 DIAGNOSIS — G4733 Obstructive sleep apnea (adult) (pediatric): Secondary | ICD-10-CM | POA: Diagnosis not present

## 2021-01-22 DIAGNOSIS — I10 Essential (primary) hypertension: Secondary | ICD-10-CM | POA: Diagnosis not present

## 2021-01-29 ENCOUNTER — Ambulatory Visit: Payer: Medicare HMO | Admitting: Physician Assistant

## 2021-02-19 ENCOUNTER — Other Ambulatory Visit: Payer: Self-pay

## 2021-02-19 ENCOUNTER — Ambulatory Visit (INDEPENDENT_AMBULATORY_CARE_PROVIDER_SITE_OTHER): Payer: Medicare HMO | Admitting: Physician Assistant

## 2021-02-19 ENCOUNTER — Encounter: Payer: Self-pay | Admitting: Physician Assistant

## 2021-02-19 VITALS — BP 132/62 | HR 72 | Temp 98.6°F | Ht 63.0 in | Wt 204.0 lb

## 2021-02-19 DIAGNOSIS — Z122 Encounter for screening for malignant neoplasm of respiratory organs: Secondary | ICD-10-CM

## 2021-02-19 DIAGNOSIS — G4733 Obstructive sleep apnea (adult) (pediatric): Secondary | ICD-10-CM

## 2021-02-19 DIAGNOSIS — J4521 Mild intermittent asthma with (acute) exacerbation: Secondary | ICD-10-CM | POA: Diagnosis not present

## 2021-02-19 DIAGNOSIS — Z87891 Personal history of nicotine dependence: Secondary | ICD-10-CM

## 2021-02-19 DIAGNOSIS — D649 Anemia, unspecified: Secondary | ICD-10-CM | POA: Diagnosis not present

## 2021-02-19 DIAGNOSIS — R110 Nausea: Secondary | ICD-10-CM

## 2021-02-19 DIAGNOSIS — E782 Mixed hyperlipidemia: Secondary | ICD-10-CM | POA: Diagnosis not present

## 2021-02-19 DIAGNOSIS — R0602 Shortness of breath: Secondary | ICD-10-CM | POA: Diagnosis not present

## 2021-02-19 MED ORDER — ONDANSETRON HCL 8 MG PO TABS: 8.0000 mg | ORAL_TABLET | Freq: Three times a day (TID) | ORAL | 0 refills | Status: AC | PRN

## 2021-02-19 NOTE — Patient Instructions (Signed)
CT of lung to order.

## 2021-02-19 NOTE — Progress Notes (Signed)
Subjective:    Patient ID: Ana King, female    DOB: 1951-05-14, 70 y.o.   MRN: [id number]  HPI Pt is a 70 yo obese female with HTN,  GERD, Reactive airway disease, OSA, HLD who presents to the clinic for 6 month follow up.   Pt is taking lisinopril daily with no problems. No CP, palpitations, headaches or vision changes.  Pt continues to be SOB with exertion. No problems lying flat. Uses CPAP at night. No coughing. Stopped smoking 7 years ago after smoking for 40 years. Denies any leg swelling. She feels like SOB is "just her normal". She uses symbicort daily.   .. 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   Reactive airway disease 06/09/2017   Former smoker 06/09/2017   Chronic venous stasis 12/22/2017   Muscle cramp 12/22/2017   Edema of left ankle 12/22/2017   Osteopenia 03/17/2018   SOB (shortness of breath) on exertion 01/11/2019   Depressed mood 01/11/2019   Right shoulder pain 04/12/2019   Cervical spondylosis 05/10/2019   Non-restorative sleep 08/17/2019   Snoring 08/17/2019   Class 2 obesity due to excess calories without serious comorbidity with body mass index (BMI) of 36.0 to 36.9 in adult 08/17/2019   Hyperlipidemia 08/18/2019   OSA (obstructive sleep apnea) 09/20/2019   Diarrhea 11/08/2019   Plantar fasciitis, left 01/24/2020   Acute intractable headache 01/31/2020   Vision changes 01/31/2020   Non-intractable vomiting 01/31/2020   Eye pain, right 01/31/2020   Herpes zoster with ophthalmic complication 02/03/2020   Hyponatremia 02/20/2020   Hypocalcemia 02/20/2020   Low hemoglobin 02/20/2020   AKI (acute kidney injury) (HCC) 02/21/2020   CKD (chronic kidney disease) stage 3, GFR 30-59 ml/min (HCC) 02/20/2021   Resolved Ambulatory  Problems    Diagnosis Date Noted   Tobacco abuse 11/02/2013   COPD (chronic obstructive pulmonary disease) (HCC) 11/02/2013   Past Medical History:  Diagnosis Date   Asthma      Review of Systems See HPI.     Objective:   Physical Exam Vitals reviewed.  Constitutional:      Appearance: Normal appearance. She is obese.  HENT:     Head: Normocephalic.  Cardiovascular:     Rate and Rhythm: Normal rate and regular rhythm.     Pulses: Normal pulses.     Heart sounds: Normal heart sounds. No murmur heard. Pulmonary:     Effort: Pulmonary effort is normal.     Breath sounds: Normal breath sounds.  Musculoskeletal:     Right lower leg: No edema.     Left lower leg: No edema.  Neurological:     General: No focal deficit present.     Mental Status: She is alert and oriented to person, place, and time.  Psychiatric:        Mood and Affect: Mood normal.         Assessment & Plan:  Marland KitchenMarland KitchenAnnet was seen today for hypertension and hyperlipidemia.  Diagnoses and all orders for this visit:  SOB (shortness of breath) on exertion -     Lipid Panel w/reflex Direct LDL -     COMPLETE METABOLIC PANEL WITH GFR -     TSH -     CBC with Differential/Platelet -     Brain natriuretic peptide  Low hemoglobin -     TSH -     CBC with Differential/Platelet  Mixed hyperlipidemia -     Lipid Panel w/reflex Direct LDL  Former smoker -     Ambulatory Referral for Lung Cancer Scre  Mild intermittent reactive airway disease with acute exacerbation  Nausea -     ondansetron (ZOFRAN) 8 MG tablet; Take 1 tablet (8 mg total) by mouth every 8 (eight) hours as needed for nausea or vomiting.  Screening for lung cancer -     Ambulatory Referral for Lung Cancer Scre  OSA (obstructive sleep apnea)   Continue lisinopril for BP.  Continue Symbicort for breathing. Added BNP for further evaluation.  Anemia labs ordered to make sure normal and not causing more SOB.  CT low dose lung screening  ordered.  Zofran sent for as needed.   Follow up in 6 months or sooner if needed.

## 2021-02-20 ENCOUNTER — Encounter: Payer: Self-pay | Admitting: Physician Assistant

## 2021-02-20 ENCOUNTER — Other Ambulatory Visit: Payer: Self-pay | Admitting: Physician Assistant

## 2021-02-20 DIAGNOSIS — N183 Chronic kidney disease, stage 3 unspecified: Secondary | ICD-10-CM | POA: Insufficient documentation

## 2021-02-20 DIAGNOSIS — R7989 Other specified abnormal findings of blood chemistry: Secondary | ICD-10-CM

## 2021-02-20 DIAGNOSIS — R0602 Shortness of breath: Secondary | ICD-10-CM

## 2021-02-20 LAB — COMPLETE METABOLIC PANEL WITH GFR
AG Ratio: 1.6 (calc) (ref 1.0–2.5)
ALT: 9 U/L (ref 6–29)
AST: 11 U/L (ref 10–35)
Albumin: 4.2 g/dL (ref 3.6–5.1)
Alkaline phosphatase (APISO): 85 U/L (ref 37–153)
BUN: 17 mg/dL (ref 7–25)
CO2: 25 mmol/L (ref 20–32)
Calcium: 10.3 mg/dL (ref 8.6–10.4)
Chloride: 104 mmol/L (ref 98–110)
Creat: 1.05 mg/dL (ref 0.50–1.05)
Globulin: 2.6 g/dL (calc) (ref 1.9–3.7)
Glucose, Bld: 82 mg/dL (ref 65–99)
Potassium: 5.2 mmol/L (ref 3.5–5.3)
Sodium: 139 mmol/L (ref 135–146)
Total Bilirubin: 0.6 mg/dL (ref 0.2–1.2)
Total Protein: 6.8 g/dL (ref 6.1–8.1)
eGFR: 58 mL/min/{1.73_m2} — ABNORMAL LOW (ref >=60)

## 2021-02-20 LAB — CBC WITH DIFFERENTIAL/PLATELET
Absolute Monocytes: 456 cells/uL (ref 200–950)
Basophils Absolute: 17 cells/uL (ref 0–200)
Basophils Relative: 0.2 %
Eosinophils Absolute: 181 cells/uL (ref 15–500)
Eosinophils Relative: 2.1 %
HCT: 36.4 % (ref 35.0–45.0)
Hemoglobin: 11.9 g/dL (ref 11.7–15.5)
Lymphs Abs: 2116 cells/uL (ref 850–3900)
MCH: 30.4 pg (ref 27.0–33.0)
MCHC: 32.7 g/dL (ref 32.0–36.0)
MCV: 93.1 fL (ref 80.0–100.0)
MPV: 10.2 fL (ref 7.5–12.5)
Monocytes Relative: 5.3 %
Neutro Abs: 5831 cells/uL (ref 1500–7800)
Neutrophils Relative %: 67.8 %
Platelets: 301 10*3/uL (ref 140–400)
RBC: 3.91 10*6/uL (ref 3.80–5.10)
RDW: 12.3 % (ref 11.0–15.0)
Total Lymphocyte: 24.6 %
WBC: 8.6 10*3/uL (ref 3.8–10.8)

## 2021-02-20 LAB — LIPID PANEL W/REFLEX DIRECT LDL
Cholesterol: 151 mg/dL (ref <200)
HDL: 64 mg/dL (ref >=50)
LDL Cholesterol (Calc): 71 mg/dL (calc)
Non-HDL Cholesterol (Calc): 87 mg/dL (calc) (ref <130)
Total CHOL/HDL Ratio: 2.4 (calc) (ref <5.0)
Triglycerides: 81 mg/dL (ref <150)

## 2021-02-20 LAB — BRAIN NATRIURETIC PEPTIDE: Brain Natriuretic Peptide: 102 pg/mL — ABNORMAL HIGH (ref <100)

## 2021-02-20 LAB — TSH: TSH: 1.77 mIU/L (ref 0.40–4.50)

## 2021-02-20 NOTE — Progress Notes (Signed)
Aariel,   Cholesterol looks great.  Thyroid wonderful.  Hemoglobin looks better than last 3 checks. Continue to increase iron rich foods.  Glucose and liver look great.  GFR 58 slight decline in kidney function. Try to avoid in OTC anti-inflammatories.

## 2021-02-20 NOTE — Progress Notes (Signed)
Your BNP is elevated. This can mean there is extra strain around your heart. I want to get an echo of your heart. Order placed.

## 2021-02-25 ENCOUNTER — Other Ambulatory Visit: Payer: Self-pay

## 2021-02-25 ENCOUNTER — Ambulatory Visit (HOSPITAL_BASED_OUTPATIENT_CLINIC_OR_DEPARTMENT_OTHER)
Admission: RE | Admit: 2021-02-25 | Discharge: 2021-02-25 | Disposition: A | Discharge status: Home / Self Care | Payer: Medicare HMO | Source: Ambulatory Visit | Attending: Physician Assistant | Admitting: Physician Assistant

## 2021-02-25 DIAGNOSIS — R0602 Shortness of breath: Secondary | ICD-10-CM

## 2021-02-25 DIAGNOSIS — R7989 Other specified abnormal findings of blood chemistry: Secondary | ICD-10-CM

## 2021-02-25 NOTE — Progress Notes (Signed)
*  PRELIMINARY RESULTS* Echocardiogram 2D Echocardiogram has been performed.  Neomia Dear RDCS 02/25/2021, 2:04 PM

## 2021-02-26 ENCOUNTER — Encounter: Payer: Self-pay | Admitting: Physician Assistant

## 2021-02-26 DIAGNOSIS — I5189 Other ill-defined heart diseases: Secondary | ICD-10-CM | POA: Insufficient documentation

## 2021-02-26 LAB — ECHOCARDIOGRAM COMPLETE
AR max vel: 3.3 cm2
AV Area VTI: 3.18 cm2
AV Area mean vel: 3 cm2
AV Mean grad: 5 mmHg
AV Peak grad: 9.1 mmHg
Ao pk vel: 1.51 m/s
Area-P 1/2: 2.82 cm2
Calc EF: 50.4 %
S' Lateral: 3.03 cm
Single Plane A2C EF: 34.1 %
Single Plane A4C EF: 63.6 %

## 2021-02-26 NOTE — Progress Notes (Signed)
Ejection Fraction is 60 to 65 percent which is good. You do have some mild diastolic dysfunction.  Valves look good.  Good echo. Should not be causing your shortness of breath.

## 2021-03-01 ENCOUNTER — Telehealth: Payer: Self-pay | Admitting: Physician Assistant

## 2021-03-01 NOTE — Progress Notes (Signed)
  Care Management   Follow Up Note   03/01/2021 Name: Ana King MRN: [id number] DOB: 1950-10-24   Referred by: [name], PA-C Reason for referral : No chief complaint on file.   An unsuccessful telephone outreach was attempted today. The patient was referred to the case management team for assistance with care management and care coordination.   Follow Up Plan: The care management team will reach out to the patient again over the next 7 days.   SIGNATURE

## 2021-03-04 ENCOUNTER — Telehealth: Payer: Self-pay | Admitting: Lab

## 2021-03-04 NOTE — Chronic Care Management (AMB) (Signed)
  Chronic Care Management   Note  03/04/2021 Name: Ana King MRN: [id number] DOB: 18-Oct-1950  [name] is a 70 y.o. year old female who is a primary care patient of [name], PA-C. I reached out to Rickard Rhymes by phone today in response to a referral sent by [name] PCP, Caleen Essex, Jade L, PA-C.   [name] was given information about Chronic Care Management services today including:  CCM service includes personalized support from designated clinical staff supervised by her physician, including individualized plan of care and coordination with other care providers 24/7 contact phone numbers for assistance for urgent and routine care needs. Service will only be billed when office clinical staff spend 20 minutes or more in a month to coordinate care. Only one practitioner may furnish and bill the service in a calendar month. The patient may stop CCM services at any time (effective at the end of the month) by phone call to the office staff.   Patient agreed to services and verbal consent obtained.   Follow up plan:   Carilyn Goodpasture  Upstream Scheduler

## 2021-03-04 NOTE — Chronic Care Management (AMB) (Signed)
  Chronic Care Management   Outreach Note  03/04/2021 Name: Ana King MRN: [id number] DOB: [birth year]  Referred by: [name], PA-C Reason for referral : Medication Management   A second unsuccessful telephone outreach was attempted today. The patient was referred to pharmacist for assistance with care management and care coordination.  Follow Up Plan:   Carilyn Goodpasture  Upstream Scheduler

## 2021-03-20 ENCOUNTER — Encounter: Payer: Self-pay | Admitting: Family Medicine

## 2021-03-26 ENCOUNTER — Other Ambulatory Visit: Payer: Self-pay | Admitting: *Deleted

## 2021-03-26 DIAGNOSIS — Z87891 Personal history of nicotine dependence: Secondary | ICD-10-CM

## 2021-04-04 ENCOUNTER — Telehealth: Payer: Self-pay

## 2021-04-04 NOTE — Chronic Care Management (AMB) (Signed)
    Chronic Care Management Pharmacy Assistant   Name: Ana King  MRN: [id number] DOB: May 25, 1951  Ana King is an 70 y.o. year old female who presents for his initial CCM visit with the clinical pharmacist.   Recent office visits:  02/19/21 - [name] PA - Seen for SOB - labs ordered - Referral for lung cancer screening -   Recent consult visits:  02/25/21 [name] PA - Echocardiogram - Notes not available - Start  ondansetron  8 MG tablet; Take 1 tablet (8 mg total) by mouth every 8 (eight) hours as needed for nausea or vomiting.- Follow up in 6 months  12/23/20 [facility] - Exposure to covid 19 - Results are Negative    Hospital visits:  None in previous 6 months  Medications: Outpatient Encounter Medications as of 04/04/2021  Medication Sig   atorvastatin (LIPITOR) 40 MG tablet Take 1 tablet (40 mg total) by mouth daily. NEEDS LABS   albuterol (PROVENTIL) (2.5 MG/3ML) 0.083% nebulizer solution Take 3 mLs (2.5 mg total) by nebulization every 6 (six) hours as needed for wheezing or shortness of breath.   dicyclomine (BENTYL) 10 MG capsule Take 1 capsule (10 mg total) by mouth 3 (three) times daily before meals.   lansoprazole (PREVACID) 30 MG capsule Take 1 capsule (30 mg total) by mouth daily at 12 noon.   lisinopril (ZESTRIL) 20 MG tablet TAKE 1 TABLET EVERY DAY   montelukast (SINGULAIR) 10 MG tablet TAKE 1 TABLET AT BEDTIME   ondansetron (ZOFRAN) 8 MG tablet Take 1 tablet (8 mg total) by mouth every 8 (eight) hours as needed for nausea or vomiting.   rizatriptan (MAXALT-MLT) 10 MG disintegrating tablet Take 1 tablet (10 mg total) by mouth as needed for migraine. May repeat in 2 hours if needed   SYMBICORT 160-4.5 MCG/ACT inhaler Inhale 2 puffs into the lungs 2 (two) times daily.   No facility-administered encounter medications on file as of 04/04/2021.    Care Gaps: COVID-19 Vaccine (3 - Booster for Pfizer series) due march 9/22 INFLUENZA VACCINE due  02/18/21 MAMMOGRAM (Every 2 Years) due 02/19/20    atorvastatin (LIPITOR) 40 MG tablet - Last filled 03/06/21 90 DS albuterol (PROVENTIL) (2.5 MG/3ML) 0.083% nebulizer solution- Last filled 06/19/17 DS unknown  dicyclomine (BENTYL) 10 MG capsule- Last filled 04/01/20 30 DS lansoprazole (PREVACID) 30 MG capsule- Last filled 02/12/21 90 DS  lisinopril (ZESTRIL) 20 MG tablet- Last filled 02/27/21 90 DS montelukast (SINGULAIR) 10 MG tablet- Last filled 12/30/20 90 DS  ondansetron (ZOFRAN) 8 MG tablet- Last filled 02/19/21 20 tablets rizatriptan (MAXALT-MLT) 10 MG disintegrating tablet- Last filled 09/11/20 10 DS  SYMBICORT 160-4.5 MCG/ACT inhaler- Last filled 02/23/21 30 DS   Star Rating Drugs: lisinopril (ZESTRIL) 20 MG tablet- Last filled 02/27/21 90 DS atorvastatin (LIPITOR) 40 MG tablet - Last filled 03/06/21 90 DS     Salli Real, CMA

## 2021-04-10 ENCOUNTER — Encounter: Payer: Self-pay | Admitting: Acute Care

## 2021-04-10 ENCOUNTER — Telehealth (INDEPENDENT_AMBULATORY_CARE_PROVIDER_SITE_OTHER): Payer: Medicare HMO | Admitting: Acute Care

## 2021-04-10 ENCOUNTER — Ambulatory Visit: Payer: Medicare HMO

## 2021-04-10 DIAGNOSIS — Z87891 Personal history of nicotine dependence: Secondary | ICD-10-CM | POA: Diagnosis not present

## 2021-04-10 NOTE — Progress Notes (Signed)
Virtual Visit via Video Note  I connected with Ana King on 04/10/21 at 12:00 PM EDT by a video enabled telemedicine application and verified that I am speaking with the correct person using two identifiers.  Location: Patient: At home Provider: [address], [place], [place], [address]    I discussed the limitations of evaluation and management by telemedicine and the availability of in person appointments. The patient expressed understanding and agreed to proceed.  Shared Decision Making Visit Lung Cancer Screening Program [phone])   Eligibility: Age 70 y.o. Pack Years Smoking History Calculation 31 pack year smoking history (# packs/per year x # years smoked) Recent History of coughing up blood  no Unexplained weight loss? no ( >Than 15 pounds within the last 6 months ) Prior History Lung / other cancer no (Diagnosis within the last 5 years already requiring surveillance chest CT Scans). Smoking Status Former Smoker Former Smokers: Years since quit: 6 years  Quit Date: 2016  Visit Components: Discussion included one or more decision making aids. yes Discussion included risk/benefits of screening. yes Discussion included potential follow up diagnostic testing for abnormal scans. yes Discussion included meaning and risk of over diagnosis. yes Discussion included meaning and risk of False Positives. yes Discussion included meaning of total radiation exposure. yes  Counseling Included: Importance of adherence to annual lung cancer LDCT screening. yes Impact of comorbidities on ability to participate in the program. yes Ability and willingness to under diagnostic treatment. yes  Smoking Cessation Counseling: Current Smokers:  Discussed importance of smoking cessation. yes Information about tobacco cessation classes and interventions provided to patient. yes Patient provided with "ticket" for LDCT Scan. yes Symptomatic Patient. no  Counseling Diagnosis Code:  Tobacco Use Z72.0 Asymptomatic Patient yes  Counseling (Intermediate counseling: > three minutes counseling) E8315 Former Smokers:  Discussed the importance of maintaining cigarette abstinence. yes Diagnosis Code: Personal History of Nicotine Dependence. V76.160 Information about tobacco cessation classes and interventions provided to patient. Yes Patient provided with "ticket" for LDCT Scan. yes Written Order for Lung Cancer Screening with LDCT placed in Epic. Yes (CT Chest Lung Cancer Screening Low Dose W/O CM) [id number] Z12.2-Screening of respiratory organs Z87.891-Personal history of nicotine dependence  I spent 25 minutes of face to face time with Ana King discussing the risks and benefits of lung cancer screening. We viewed a power point together that explained in detail the above noted topics. We took the time to pause the power point at intervals to allow for questions to be asked and answered to ensure understanding. We discussed that she had taken the single most powerful action possible to decrease her risk of developing lung cancer when she quit smoking. I counseled her to remain smoke free, and to contact me if she ever had the desire to smoke again so that I can provide resources and tools to help support the effort to remain smoke free. We discussed the time and location of the scan, and that either  Ana Miyamoto RN or I will call with the results within  24-48 hours of receiving them. She has my card and contact information in the event she needs to speak with me, in addition to a copy of the power point we reviewed as a resource. She verbalized understanding of all of the above and had no further questions upon leaving the office.     I explained to the patient that there has been a high incidence of coronary artery disease noted on these exams. I  explained that this is a non-gated exam therefore degree or severity cannot be determined. This patient is on statin therapy. I have  asked the patient to follow-up with their PCP regarding any incidental finding of coronary artery disease and management with diet or medication as they feel is clinically indicated. The patient verbalized understanding of the above and had no further questions.     Bevelyn Ngo, NP 04/10/2021

## 2021-04-10 NOTE — Patient Instructions (Signed)
Thank you for participating in the [facility]. It was our pleasure to meet you today. We will call you with the results of your scan within the next few days. Your scan will be assigned a Lung RADS category score by the physicians reading the scans.  This Lung RADS score determines follow up scanning.  See below for description of categories, and follow up screening recommendations. We will be in touch to schedule your follow up screening annually or based on recommendations of our providers. We will fax a copy of your scan results to your Primary Care Physician, or the physician who referred you to the program, to ensure they have the results. Please call the office if you have any questions or concerns regarding your scanning experience or results.  Our office number is [phone]. Please speak with Denise Phelps, RN. She is our Lung Cancer Screening RN. If she is unavailable when you call, please have the office staff send her a message. She will return your call at her earliest convenience. Remember, if your scan is normal, we will scan you annually as long as you continue to meet the criteria for the program. (Age 55-77, Current smoker or smoker who has quit within the last 15 years). If you are a smoker, remember, quitting is the single most powerful action that you can take to decrease your risk of lung cancer and other pulmonary, breathing related problems. We know quitting is hard, and we are here to help.  Please let us know if there is anything we can do to help you meet your goal of quitting. If you are a former smoker, congratulations. We are proud of you! Remain smoke free! Remember you can refer friends or family members through the number above.  We will screen them to make sure they meet criteria for the program. Thank you for helping us take better care of you by participating in Lung Screening.  Lung RADS Categories:  Lung RADS 1: no nodules  or definitely non-concerning nodules.  Recommendation is for a repeat annual scan in 12 months.  Lung RADS 2:  nodules that are non-concerning in appearance and behavior with a very low likelihood of becoming an active cancer. Recommendation is for a repeat annual scan in 12 months.  Lung RADS 3: nodules that are probably non-concerning , includes nodules with a low likelihood of becoming an active cancer.  Recommendation is for a 6-month repeat screening scan. Often noted after an upper respiratory illness. We will be in touch to make sure you have no questions, and to schedule your 6-month scan.  Lung RADS 4 A: nodules with concerning findings, recommendation is most often for a follow up scan in 3 months or additional testing based on our provider's assessment of the scan. We will be in touch to make sure you have no questions and to schedule the recommended 3 month follow up scan.  Lung RADS 4 B:  indicates findings that are concerning. We will be in touch with you to schedule additional diagnostic testing based on our provider's  assessment of the scan.   

## 2021-04-12 ENCOUNTER — Other Ambulatory Visit: Payer: Self-pay

## 2021-04-12 ENCOUNTER — Ambulatory Visit (INDEPENDENT_AMBULATORY_CARE_PROVIDER_SITE_OTHER): Payer: Medicare HMO | Admitting: Pharmacist

## 2021-04-12 DIAGNOSIS — I10 Essential (primary) hypertension: Secondary | ICD-10-CM

## 2021-04-12 DIAGNOSIS — E782 Mixed hyperlipidemia: Secondary | ICD-10-CM

## 2021-04-12 DIAGNOSIS — J4521 Mild intermittent asthma with (acute) exacerbation: Secondary | ICD-10-CM

## 2021-04-12 NOTE — Progress Notes (Signed)
Chronic Care Management Pharmacy Note  04/12/2021 Name:  Ana King MRN:  [id number] DOB:  June 08, 1951  Summary: HTN, HLD, COPD  Recommendations/Changes made from today's visit: none  Plan: f/u with pharmacist in 1 yr  Subjective: Ana King is an 70 y.o. year old female who is a primary patient of Donella Stade, PA-C.  The CCM team was consulted for assistance with disease management and care coordination needs.    Engaged with patient by telephone for initial visit in response to provider referral for pharmacy case management and/or care coordination services.   Consent to Services:  The patient was given information about Chronic Care Management services, agreed to services, and gave verbal consent prior to initiation of services.  Please see initial visit note for detailed documentation.   Patient Care Team: Lavada Mesi as PCP - General (Family Medicine) Darius Bump, [facility] as Pharmacist (Pharmacist)  Recent office visits:  02/19/21 - Donella Stade PA - Seen for SOB - labs ordered - Referral for lung cancer screening -    Recent consult visits:  02/25/21 Donella Stade PA - Echocardiogram - Notes not available - Start  ondansetron  8 MG tablet; Take 1 tablet (8 mg total) by mouth every 8 (eight) hours as needed for nausea or vomiting.- Follow up in 6 months  12/23/20 [facility] - Exposure to covid 19 - Results are Negative      Hospital visits:  None in previous 6 months  Objective:  Lab Results  Component Value Date   CREATININE 1.05 02/19/2021   CREATININE 0.96 02/20/2020   CREATININE 1.25 (H) 11/08/2019    Lab Results  Component Value Date   HGBA1C 5.6 11/08/2019   Last diabetic Eye exam: No results found for: HMDIABEYEEXA  Last diabetic Foot exam: No results found for: HMDIABFOOTEX      Component Value Date/Time   CHOL 151 02/19/2021 0000   TRIG 81 02/19/2021 0000   HDL 64 02/19/2021 0000   CHOLHDL 2.4 02/19/2021 0000   LDLCALC  71 02/19/2021 0000    Hepatic Function Latest Ref Rng & Units 02/19/2021 08/17/2019 02/17/2018  Total Protein 6.1 - 8.1 g/dL 6.8 6.1 6.5  Albumin 3.5 - 5.2 g/dL - - -  AST 10 - 35 U/L 11 11 10   ALT 6 - 29 U/L 9 8 10   Alk Phosphatase 39 - 117 U/L - - -  Total Bilirubin 0.2 - 1.2 mg/dL 0.6 0.4 0.4    Lab Results  Component Value Date/Time   TSH 1.77 02/19/2021 12:00 AM    CBC Latest Ref Rng & Units 02/19/2021 03/27/2020 02/20/2020  WBC 3.8 - 10.8 Thousand/uL 8.6 9.7 9.7  Hemoglobin 11.7 - 15.5 g/dL 11.9 10.5(L) 10.9(L)  Hematocrit 35.0 - 45.0 % 36.4 32.0(L) 33.1(L)  Platelets 140 - 400 Thousand/uL 301 325 361    No results found for: VD25OH  Clinical ASCVD:  The 10-year ASCVD risk score (Arnett DK, et al., 2019) is: 17.2%   Values used to calculate the score:     Age: 58 years     Sex: Female     Is Non-Hispanic African American: No     Diabetic: No     Tobacco smoker: Yes     Systolic Blood Pressure: 409 mmHg     Is BP treated: Yes     HDL Cholesterol: 64 mg/dL     Total Cholesterol: 151 mg/dL     Social History   Tobacco Use  Smoking Status Former   Types: Cigarettes   Quit date: 05/21/2014   Years since quitting: 6.8  Smokeless Tobacco Never   BP Readings from Last 3 Encounters:  02/19/21 132/62  07/31/20 131/60  04/11/20 (!) 121/48   Pulse Readings from Last 3 Encounters:  02/19/21 72  07/31/20 74  04/11/20 77   Wt Readings from Last 3 Encounters:  02/19/21 204 lb (92.5 kg)  07/31/20 196 lb 4.8 oz (89 kg)  04/11/20 186 lb (84.4 kg)    Assessment: Review of patient past medical history, allergies, medications, health status, including review of consultants reports, laboratory and other test data, was performed as part of comprehensive evaluation and provision of chronic care management services.   SDOH:  (Social Determinants of Health) assessments and interventions performed:    CCM Care Plan  Allergies  Allergen Reactions   Penicillins Hives     Medications Reviewed Today     Reviewed by Magdalen Spatz, NP (Nurse Practitioner) on 04/10/21 at 1217  Med List Status: <None>   Medication Order Taking? Sig Documenting Provider Last Dose Status Informant  albuterol (PROVENTIL) (2.5 MG/3ML) 0.083% nebulizer solution [id number] No Take 3 mLs (2.5 mg total) by nebulization every 6 (six) hours as needed for wheezing or shortness of breath. Hali Marry, MD Taking Active   atorvastatin (LIPITOR) 40 MG tablet [id number]  Take 1 tablet (40 mg total) by mouth daily. NEEDS LABS Breeback, Jade L, PA-C  Active   dicyclomine (BENTYL) 10 MG capsule [id number] No Take 1 capsule (10 mg total) by mouth 3 (three) times daily before meals. Donella Stade, PA-C Taking Active   lansoprazole (PREVACID) 30 MG capsule [id number] No Take 1 capsule (30 mg total) by mouth daily at 12 noon. Donella Stade, PA-C Taking Active   lisinopril (ZESTRIL) 20 MG tablet [id number]  TAKE 1 TABLET EVERY DAY Breeback, Jade L, PA-C  Active   montelukast (SINGULAIR) 10 MG tablet [id number]  TAKE 1 TABLET AT BEDTIME Breeback, Jade L, PA-C  Active   ondansetron (ZOFRAN) 8 MG tablet [id number]  Take 1 tablet (8 mg total) by mouth every 8 (eight) hours as needed for nausea or vomiting. Breeback, Jade L, PA-C  Active   rizatriptan (MAXALT-MLT) 10 MG disintegrating tablet [id number]  Take 1 tablet (10 mg total) by mouth as needed for migraine. May repeat in 2 hours if needed Lavada Mesi  Active   SYMBICORT 160-4.5 MCG/ACT inhaler [id number] No Inhale 2 puffs into the lungs 2 (two) times daily. Donella Stade, PA-C Taking Active             Patient Active Problem List   Diagnosis Date Noted   Grade I diastolic dysfunction 53/64/6803   CKD (chronic kidney disease) stage 3, GFR 30-59 ml/min ([facility]) 02/20/2021   AKI (acute kidney injury) ([facility]) 02/21/2020   Hyponatremia 02/20/2020   Hypocalcemia 02/20/2020   Low hemoglobin 02/20/2020   Herpes zoster with ophthalmic  complication 21/22/4825   Acute intractable headache 01/31/2020   Vision changes 01/31/2020   Non-intractable vomiting 01/31/2020   Eye pain, right 01/31/2020   Plantar fasciitis, left 01/24/2020   Diarrhea 11/08/2019   OSA (obstructive sleep apnea) 09/20/2019   Hyperlipidemia 08/18/2019   Non-restorative sleep 08/17/2019   Snoring 08/17/2019   Class 2 obesity due to excess calories without serious comorbidity with body mass index (BMI) of 36.0 to 36.9 in adult 08/17/2019   Cervical spondylosis 05/10/2019   Right shoulder pain 04/12/2019  SOB (shortness of breath) on exertion 01/11/2019   Depressed mood 01/11/2019   Osteopenia 03/17/2018   Chronic venous stasis 12/22/2017   Muscle cramp 12/22/2017   Edema of left ankle 12/22/2017   Reactive airway disease 06/09/2017   Former smoker 06/09/2017   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   Contusion 11/13/2015   Essential hypertension, benign 08/28/2015   Hiatal hernia 12/09/2013   Anxiety state 11/02/2013   GERD (gastroesophageal reflux disease) 11/02/2013    Immunization History  Administered Date(s) Administered   Fluad Quad(high Dose 65+) 03/18/2019, 05/01/2020   Influenza,inj,Quad PF,6+ Mos 05/24/2014, 05/07/2015, 05/13/2016, 03/24/2017, 05/03/2018   PFIZER(Purple Top)SARS-COV-2 Vaccination 03/07/2020, 04/28/2020   Pneumococcal Conjugate-13 03/24/2017   Pneumococcal Polysaccharide-23 05/03/2018   Tdap 03/02/2018   Zoster Recombinat (Shingrix) 10/20/2019, 05/01/2020    Conditions to be addressed/monitored: HTN, HLD, and COPD  There are no care plans that you recently modified to display for this patient.   Medication Assistance: None required.  Patient affirms current coverage meets needs.  Patient's preferred pharmacy is:  [facility], [address] [place] [address] Phone: [phone] Fax: [phone]  [facility], [place] - [address] [place] [address] Phone: [phone] Fax: [phone]  Uses pill box? Yes Pt endorses 100% compliance  Follow Up:  Patient agrees to Care Plan and Follow-up.  Plan: Telephone follow up appointment with care management team member scheduled for:  1 year  Darius Bump

## 2021-04-12 NOTE — Patient Instructions (Signed)
Visit Information   PATIENT GOALS:   Goals Addressed             This Visit's Progress    Medication Management       Patient Goals/Self-Care Activities Over the next 365 days, patient will:  take medications as prescribed  Follow Up Plan: Telephone follow up appointment with care management team member scheduled for:  1 year         Consent to CCM Services: Ms. Mcweeney was given information about Chronic Care Management services including:  CCM service includes personalized support from designated clinical staff supervised by her physician, including individualized plan of care and coordination with other care providers 24/7 contact phone numbers for assistance for urgent and routine care needs. Service will only be billed when office clinical staff spend 20 minutes or more in a month to coordinate care. Only one practitioner may furnish and bill the service in a calendar month. The patient may stop CCM services at any time (effective at the end of the month) by phone call to the office staff. The patient will be responsible for cost sharing (co-pay) of up to 20% of the service fee (after annual deductible is met).  Patient agreed to services and verbal consent obtained.   Patient verbalizes understanding of instructions provided today and agrees to view in [facility].   Telephone follow up appointment with care management team member scheduled for: 1 year  Darius Bump   CLINICAL CARE PLAN: Patient Care Plan: Medication Management     Problem Identified: HTN, HLD, reactive airway disease      Long-Range Goal: Disease Progression Prevention   Start Date: 04/12/2021  This Visit's Progress: On track  Priority: High  Note:   Patient Goals/Self-Care Activities Over the next 365 days, patient will:  take medications as prescribed  Follow Up Plan: Telephone follow up appointment with care management team member scheduled for:  1 year

## 2021-04-15 ENCOUNTER — Ambulatory Visit (INDEPENDENT_AMBULATORY_CARE_PROVIDER_SITE_OTHER): Payer: Medicare HMO | Admitting: Physician Assistant

## 2021-04-15 ENCOUNTER — Ambulatory Visit (INDEPENDENT_AMBULATORY_CARE_PROVIDER_SITE_OTHER): Payer: Medicare HMO

## 2021-04-15 ENCOUNTER — Other Ambulatory Visit: Payer: Self-pay

## 2021-04-15 DIAGNOSIS — Z23 Encounter for immunization: Secondary | ICD-10-CM

## 2021-04-15 DIAGNOSIS — Z87891 Personal history of nicotine dependence: Secondary | ICD-10-CM

## 2021-04-16 ENCOUNTER — Other Ambulatory Visit: Payer: Self-pay | Admitting: Neurology

## 2021-04-16 MED ORDER — LANSOPRAZOLE 30 MG PO CPDR
30.0000 mg | DELAYED_RELEASE_CAPSULE | Freq: Every day | ORAL | 3 refills | Status: DC
Start: 2021-04-16 — End: 2022-02-20

## 2021-04-17 ENCOUNTER — Other Ambulatory Visit: Payer: Self-pay | Admitting: Physician Assistant

## 2021-04-17 DIAGNOSIS — I2510 Atherosclerotic heart disease of native coronary artery without angina pectoris: Secondary | ICD-10-CM | POA: Insufficient documentation

## 2021-04-17 DIAGNOSIS — J439 Emphysema, unspecified: Secondary | ICD-10-CM | POA: Insufficient documentation

## 2021-04-17 DIAGNOSIS — J432 Centrilobular emphysema: Secondary | ICD-10-CM | POA: Insufficient documentation

## 2021-04-17 DIAGNOSIS — I700 Atherosclerosis of aorta: Secondary | ICD-10-CM | POA: Insufficient documentation

## 2021-04-17 MED ORDER — BREZTRI AEROSPHERE 160-9-4.8 MCG/ACT IN AERO: 2.0000 | INHALATION_SPRAY | Freq: Two times a day (BID) | RESPIRATORY_TRACT | 0 refills | Status: DC

## 2021-04-17 NOTE — Progress Notes (Signed)
CT low dose lung screen.  Aortic atherosclerosis Emphysema CAD  Stop symbicort and start breztri.

## 2021-04-19 DIAGNOSIS — E782 Mixed hyperlipidemia: Secondary | ICD-10-CM

## 2021-04-19 DIAGNOSIS — J4521 Mild intermittent asthma with (acute) exacerbation: Secondary | ICD-10-CM | POA: Diagnosis not present

## 2021-04-19 DIAGNOSIS — I10 Essential (primary) hypertension: Secondary | ICD-10-CM

## 2021-04-23 DIAGNOSIS — I10 Essential (primary) hypertension: Secondary | ICD-10-CM | POA: Diagnosis not present

## 2021-04-23 DIAGNOSIS — G4733 Obstructive sleep apnea (adult) (pediatric): Secondary | ICD-10-CM | POA: Diagnosis not present

## 2021-04-30 ENCOUNTER — Other Ambulatory Visit: Payer: Self-pay | Admitting: Neurology

## 2021-04-30 DIAGNOSIS — J4521 Mild intermittent asthma with (acute) exacerbation: Secondary | ICD-10-CM

## 2021-04-30 MED ORDER — SYMBICORT 160-4.5 MCG/ACT IN AERO: 2.0000 | INHALATION_SPRAY | Freq: Two times a day (BID) | RESPIRATORY_TRACT | 3 refills | Status: DC

## 2021-04-30 NOTE — Progress Notes (Signed)
Breztri too expensive, patient went back on Symbicort.

## 2021-05-03 ENCOUNTER — Encounter: Payer: Self-pay | Admitting: *Deleted

## 2021-05-03 ENCOUNTER — Other Ambulatory Visit: Payer: Self-pay | Admitting: Acute Care

## 2021-05-03 DIAGNOSIS — Z87891 Personal history of nicotine dependence: Secondary | ICD-10-CM

## 2021-05-06 ENCOUNTER — Telehealth: Payer: Self-pay

## 2021-05-06 ENCOUNTER — Other Ambulatory Visit: Payer: Self-pay | Admitting: Physician Assistant

## 2021-05-06 NOTE — Telephone Encounter (Signed)
Medication: Budeson-Glycopyrrol-Formoterol (BREZTRI AEROSPHERE) 160-9-4.8 MCG/ACT Aerosol Prior authorization submitted via CoverMyMeds on 05/06/2021 [place] submission pending

## 2021-05-08 NOTE — Telephone Encounter (Signed)
Medication: Budeson-Glycopyrrol-Formoterol (BREZTRI AEROSPHERE) 160-9-4.8 MCG/ACT Aerosol Prior authorization determination received Medication has been approved Approval dates: 05/06/2021-07/20/2022  Patient aware via: MyChart Pharmacy aware: Yes Provider aware via this encounter

## 2021-05-13 ENCOUNTER — Other Ambulatory Visit: Payer: Self-pay | Admitting: Neurology

## 2021-05-13 MED ORDER — MECLIZINE HCL 25 MG PO TABS: 25.0000 mg | ORAL_TABLET | Freq: Three times a day (TID) | ORAL | 0 refills | Status: DC | PRN

## 2021-06-03 ENCOUNTER — Ambulatory Visit (INDEPENDENT_AMBULATORY_CARE_PROVIDER_SITE_OTHER): Payer: Medicare HMO

## 2021-06-03 ENCOUNTER — Ambulatory Visit (INDEPENDENT_AMBULATORY_CARE_PROVIDER_SITE_OTHER): Payer: Medicare HMO | Admitting: Physician Assistant

## 2021-06-03 ENCOUNTER — Other Ambulatory Visit: Payer: Self-pay

## 2021-06-03 VITALS — BP 129/53 | HR 79 | Ht 63.0 in | Wt 207.0 lb

## 2021-06-03 DIAGNOSIS — M25512 Pain in left shoulder: Secondary | ICD-10-CM | POA: Insufficient documentation

## 2021-06-03 MED ORDER — TRAMADOL HCL 50 MG PO TABS: 50.0000 mg | ORAL_TABLET | Freq: Four times a day (QID) | ORAL | 0 refills | Status: AC | PRN

## 2021-06-03 MED ORDER — MELOXICAM 15 MG PO TABS: 15.0000 mg | ORAL_TABLET | Freq: Every day | ORAL | 0 refills | Status: DC

## 2021-06-03 NOTE — Progress Notes (Signed)
   Subjective:    Patient ID: Ana King, female    DOB: October 24, 1950, 70 y.o.   MRN: [id number]  HPI Pt is a 70 yo female who presents to the clinic with left shoulder pain for the past 2 weeks. No known injury. She does watch her 64 year old granddaughter and have to pick her up from time to time. Worse with lifting and reaching backwards and overhead lifting. Tylenol is not helping. Last night started to keep her up.   .. 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   Reactive airway disease 06/09/2017   Former smoker 06/09/2017   Chronic venous stasis 12/22/2017   Muscle cramp 12/22/2017   Edema of left ankle 12/22/2017   Osteopenia 03/17/2018   SOB (shortness of breath) on exertion 01/11/2019   Depressed mood 01/11/2019   Right shoulder pain 04/12/2019   Cervical spondylosis 05/10/2019   Non-restorative sleep 08/17/2019   Snoring 08/17/2019   Class 2 obesity due to excess calories without serious comorbidity with body mass index (BMI) of 36.0 to 36.9 in adult 08/17/2019   Hyperlipidemia 08/18/2019   OSA (obstructive sleep apnea) 09/20/2019   Diarrhea 11/08/2019   Plantar fasciitis, left 01/24/2020   Acute intractable headache 01/31/2020   Vision changes 01/31/2020   Non-intractable vomiting 01/31/2020   Eye pain, right 01/31/2020   Herpes zoster with ophthalmic complication 02/03/2020   Hyponatremia 02/20/2020   Hypocalcemia 02/20/2020   Low hemoglobin 02/20/2020   AKI (acute kidney injury) ([facility]) 02/21/2020   CKD (chronic kidney disease) stage 3, GFR 30-59 ml/min ([facility]) 02/20/2021   Grade I diastolic dysfunction 02/26/2021   Coronary artery calcification seen on CT scan 04/17/2021   Aortic atherosclerosis ([facility]) 04/17/2021   Emphysema lung ([facility]) 04/17/2021    Acute pain of left shoulder 06/03/2021   Resolved Ambulatory Problems    Diagnosis Date Noted   Tobacco abuse 11/02/2013   COPD (chronic obstructive pulmonary disease) ([facility]) 11/02/2013   Past Medical History:  Diagnosis Date   Asthma         Review of Systems See HPI.     Objective:   Physical Exam  Left shoulder: Active Abduction to 110 degrees Passive Abduction to 150 degrees Pain with external ROM.  Pain to palpation posterior shoulder and into deltoid Negative drop arm sign Hand grip 5/5.  Decreased left upper ext strength 4/5.       Assessment & Plan:  Marland KitchenMarland KitchenGalya was seen today for arm pain.  Diagnoses and all orders for this visit:  Acute pain of left shoulder -     meloxicam (MOBIC) 15 MG tablet; Take 1 tablet (15 mg total) by mouth daily. -     traMADol (ULTRAM) 50 MG tablet; Take 1 tablet (50 mg total) by mouth every 6 (six) hours as needed for up to 5 days. -     DG Shoulder Left; Future   Bursitis vs rotator cuff tendonitis.  Will get xray.  Mobic for the next 5 days. Hx of some decreased GFR but last labs looked great.  Tramadol for break through pain.  Voltaren gel sample given.  Encouraged icing.  Exercises printed for patient to start.

## 2021-06-03 NOTE — Patient Instructions (Signed)
Shoulder Impingement Syndrome Rehab Ask your health care provider which exercises are safe for you. Do exercises exactly as told by your health care provider and adjust them as directed. It is normal to feel mild stretching, pulling, tightness, or discomfort as you do these exercises. Stop right away if you feel sudden pain or your pain gets worse. Do not begin these exercises until told by your health care provider. Stretching and range-of-motion exercise This exercise warms up your muscles and joints and improves the movement and flexibility of your shoulder. This exercise also helps to relieve pain andstiffness. Passive horizontal adduction In passive adduction, you use your other hand to move the injured arm toward your body. The injured arm does not move on its own. In this movement, your arm is moved across your body in the horizontal plane (horizontal adduction). Sit or stand and pull your left / right elbow across your chest, toward your other shoulder. Stop when you feel a gentle stretch in the back of your shoulder and upper arm. Keep your arm at shoulder height. Keep your arm as close to your body as you comfortably can. Hold for __________ seconds. Slowly return to the starting position. Repeat __________ times. Complete this exercise __________ times a day. Strengthening exercises These exercises build strength and endurance in your shoulder. Endurance is theability to use your muscles for a long time, even after they get tired. External rotation, isometric This is an exercise in which you press the back of your wrist against a door frame without moving your shoulder joint (isometric). Stand or sit in a doorway, facing the door frame. Bend your left / right elbow and place the back of your wrist against the door frame. Only the back of your wrist should be touching the frame. Keep your upper arm at your side. Gently press your wrist against the door frame, as if you are trying to push  your arm away from your abdomen (external rotation). Press as hard as you are able without pain. Avoid shrugging your shoulder while you press your wrist against the door frame. Keep your shoulder blade tucked down toward the middle of your back. Hold for __________ seconds. Slowly release the tension, and relax your muscles completely before you repeat the exercise. Repeat __________ times. Complete this exercise __________ times a day. Internal rotation, isometric This is an exercise in which you press your palm against a door frame without moving your shoulder joint (isometric). Stand or sit in a doorway, facing the door frame. Bend your left / right elbow and place the palm of your hand against the door frame. Only your palm should be touching the frame. Keep your upper arm at your side. Gently press your hand against the door frame, as if you are trying to push your arm toward your abdomen (internal rotation). Press as hard as you are able without pain. Avoid shrugging your shoulder while you press your hand against the door frame. Keep your shoulder blade tucked down toward the middle of your back. Hold for __________ seconds. Slowly release the tension, and relax your muscles completely before you repeat the exercise. Repeat __________ times. Complete this exercise __________ times a day. Scapular protraction, supine  Lie on your back on a firm surface (supine position). Hold a __________ weight in your left / right hand. Raise your left / right arm straight into the air so your hand is directly above your shoulder joint. Push the weight into the air so your shoulder (  scapula) lifts off the surface that you are lying on. The scapula will push up or forward (protraction). Do not move your head, neck, or back. Hold for __________ seconds. Slowly return to the starting position. Let your muscles relax completely before you repeat this exercise. Repeat __________ times. Complete this exercise  __________ times a day. Scapular retraction  Sit in a stable chair without armrests, or stand up. Secure an exercise band to a stable object in front of you so the band is at shoulder height. Hold one end of the exercise band in each hand. Your palms should face down. Squeeze your shoulder blades together (retraction) and move your elbows slightly behind you. Do not shrug your shoulders upward while you do this. Hold for __________ seconds. Slowly return to the starting position. Repeat __________ times. Complete this exercise __________ times a day. Shoulder extension  Sit in a stable chair without armrests, or stand up. Secure an exercise band to a stable object in front of you so the band is above shoulder height. Hold one end of the exercise band in each hand. Straighten your elbows and lift your hands up to shoulder height. Squeeze your shoulder blades together and pull your hands down to the sides of your thighs (extension). Stop when your hands are straight down by your sides. Do not let your hands go behind your body. Hold for __________ seconds. Slowly return to the starting position. Repeat __________ times. Complete this exercise __________ times a day. This information is not intended to replace advice given to you by your health care provider. Make sure you discuss any questions you have with your healthcare provider. Document Revised: 10/29/2018 Document Reviewed: 08/02/2018 Elsevier Patient Education  2022 [employer] Inc.  

## 2021-06-04 ENCOUNTER — Encounter: Payer: Self-pay | Admitting: Physician Assistant

## 2021-06-04 NOTE — Progress Notes (Signed)
No acute findings on shoulder xray.

## 2021-07-07 ENCOUNTER — Other Ambulatory Visit: Payer: Self-pay | Admitting: Physician Assistant

## 2021-07-07 DIAGNOSIS — M25512 Pain in left shoulder: Secondary | ICD-10-CM

## 2021-07-08 ENCOUNTER — Encounter: Payer: Self-pay | Admitting: Physician Assistant

## 2021-07-08 ENCOUNTER — Other Ambulatory Visit: Payer: Self-pay

## 2021-07-08 ENCOUNTER — Ambulatory Visit (INDEPENDENT_AMBULATORY_CARE_PROVIDER_SITE_OTHER): Payer: Medicare HMO | Admitting: Physician Assistant

## 2021-07-08 VITALS — BP 157/69 | HR 86 | Temp 98.1°F | Ht 63.0 in | Wt 204.0 lb

## 2021-07-08 DIAGNOSIS — J441 Chronic obstructive pulmonary disease with (acute) exacerbation: Secondary | ICD-10-CM

## 2021-07-08 DIAGNOSIS — J432 Centrilobular emphysema: Secondary | ICD-10-CM | POA: Diagnosis not present

## 2021-07-08 DIAGNOSIS — R0602 Shortness of breath: Secondary | ICD-10-CM | POA: Diagnosis not present

## 2021-07-08 MED ORDER — PREDNISONE 50 MG PO TABS: ORAL_TABLET | ORAL | 0 refills | Status: DC

## 2021-07-08 NOTE — Telephone Encounter (Signed)
Given 06/03/2021 for acute pain, ok to refill?

## 2021-07-08 NOTE — Progress Notes (Signed)
Subjective:    Patient ID: Ana King, female    DOB: 1951-05-03, 70 y.o.   MRN: [id number]  HPI Patient is a 70 year old female with hypertension, CAD, emphysema, OSA and former smoker who presents to the clinic with shortness of breath on exertion.  Patient does use trilogy every day which is made a huge difference in how much she has to use her albuterol.  Lately for the last 2 days she has been using her albuterol 3-4 times a day.  She denies any fever, chills, cough, congestion, sore throat, ear pain, body aches.  She has not taken any over-the-counter medication.  .. 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   Reactive airway disease 06/09/2017   Former smoker 06/09/2017   Chronic venous stasis 12/22/2017   Muscle cramp 12/22/2017   Edema of left ankle 12/22/2017   Osteopenia 03/17/2018   SOB (shortness of breath) on exertion 01/11/2019   Depressed mood 01/11/2019   Right shoulder pain 04/12/2019   Cervical spondylosis 05/10/2019   Non-restorative sleep 08/17/2019   Snoring 08/17/2019   Class 2 obesity due to excess calories without serious comorbidity with body mass index (BMI) of 36.0 to 36.9 in adult 08/17/2019   Hyperlipidemia 08/18/2019   OSA (obstructive sleep apnea) 09/20/2019   Diarrhea 11/08/2019   Plantar fasciitis, left 01/24/2020   Acute intractable headache 01/31/2020   Vision changes 01/31/2020   Non-intractable vomiting 01/31/2020   Eye pain, right 01/31/2020   Herpes zoster with ophthalmic complication 02/03/2020   Hyponatremia 02/20/2020   Hypocalcemia 02/20/2020   Low hemoglobin 02/20/2020   AKI (acute kidney injury) (HCC) 02/21/2020   CKD (chronic kidney disease) stage 3, GFR 30-59 ml/min (HCC) 02/20/2021   Grade I diastolic  dysfunction 02/26/2021   Coronary artery calcification seen on CT scan 04/17/2021   Aortic atherosclerosis (HCC) 04/17/2021   Emphysema lung (HCC) 04/17/2021   Acute pain of left shoulder 06/03/2021   Resolved Ambulatory Problems    Diagnosis Date Noted   Tobacco abuse 11/02/2013   COPD (chronic obstructive pulmonary disease) (HCC) 11/02/2013   Past Medical History:  Diagnosis Date   Asthma      Review of Systems See HPI.     Objective:   Physical Exam Vitals reviewed.  Constitutional:      Appearance: She is well-developed. She is obese.  HENT:     Head: Normocephalic.     Mouth/Throat:     Mouth: Mucous membranes are moist.     Pharynx: No pharyngeal swelling or oropharyngeal exudate.  Cardiovascular:     Rate and Rhythm: Normal rate.  Pulmonary:     Effort: Pulmonary effort is normal. No respiratory distress.     Breath sounds: Examination of the right-upper field reveals decreased breath sounds. Examination of the left-upper field reveals decreased breath sounds. Examination of the right-middle field reveals decreased breath sounds. Examination of the left-middle field reveals decreased breath sounds. Examination of the right-lower field reveals decreased breath sounds. Examination of the left-lower field reveals decreased breath sounds. Decreased breath sounds present. No wheezing, rhonchi or rales.  Musculoskeletal:     Cervical back: Normal range of motion.  Neurological:     General: No focal deficit present.     Mental Status: She is alert.  Psychiatric:  Mood and Affect: Mood normal.          Assessment & Plan:  Marland KitchenMarland KitchenSyren was seen today for shortness of breath.  Diagnoses and all orders for this visit:  COPD exacerbation ([place]) -     predniSONE (DELTASONE) 50 MG tablet; One tab PO daily for 5 days.  SOB (shortness of breath) on exertion -     predniSONE (DELTASONE) 50 MG tablet; One tab PO daily for 5 days.  Centrilobular emphysema  (HCC)   No signs of bacterial infection.  Start prednisone burst. Continue albuterol inhaler every 2-6 hours as needed.  Vitals and pulse ox reassuring.  Lungs sound more diminished, no wheezing or crackles.  Follow up as needed or if symptoms worsen or not improve.

## 2021-07-24 ENCOUNTER — Ambulatory Visit: Payer: Medicare HMO | Admitting: Physician Assistant

## 2021-07-26 ENCOUNTER — Ambulatory Visit (INDEPENDENT_AMBULATORY_CARE_PROVIDER_SITE_OTHER): Payer: Medicare HMO | Admitting: Physician Assistant

## 2021-07-26 ENCOUNTER — Encounter: Payer: Self-pay | Admitting: Physician Assistant

## 2021-07-26 ENCOUNTER — Other Ambulatory Visit: Payer: Self-pay

## 2021-07-26 VITALS — Ht 63.0 in | Wt 204.0 lb

## 2021-07-26 DIAGNOSIS — H6122 Impacted cerumen, left ear: Secondary | ICD-10-CM | POA: Diagnosis not present

## 2021-07-26 DIAGNOSIS — R600 Localized edema: Secondary | ICD-10-CM

## 2021-07-26 DIAGNOSIS — R42 Dizziness and giddiness: Secondary | ICD-10-CM

## 2021-07-26 DIAGNOSIS — J432 Centrilobular emphysema: Secondary | ICD-10-CM

## 2021-07-26 MED ORDER — ALBUTEROL SULFATE HFA 108 (90 BASE) MCG/ACT IN AERS: 2.0000 | INHALATION_SPRAY | Freq: Four times a day (QID) | RESPIRATORY_TRACT | 2 refills | Status: DC | PRN

## 2021-07-26 MED ORDER — ALBUTEROL SULFATE (2.5 MG/3ML) 0.083% IN NEBU: 2.5000 mg | INHALATION_SOLUTION | Freq: Four times a day (QID) | RESPIRATORY_TRACT | 0 refills | Status: AC | PRN

## 2021-07-26 MED ORDER — HYDROCHLOROTHIAZIDE 12.5 MG PO TABS: 12.5000 mg | ORAL_TABLET | Freq: Every day | ORAL | 1 refills | Status: DC

## 2021-07-26 NOTE — Patient Instructions (Signed)
Vertigo Vertigo is the feeling that you or your surroundings are moving when they are not. This feeling can come and go at any time. Vertigo often goes away on its own. Vertigo can be dangerous if it occurs while you are doing something thatcould endanger yourself or others, such as driving or operating machinery. Your health care provider will do tests to try to determine the cause of your vertigo. Tests will also help your health care provider decide how best totreat your condition. Follow these instructions at home: Eating and drinking     Dehydration can make vertigo worse. Drink enough fluid to keep your urine pale yellow. Do not drink alcohol. Activity Return to your normal activities as told by your health care provider. Ask your health care provider what activities are safe for you. In the morning, first sit up on the side of the bed. When you feel okay, stand slowly while you hold onto something until you know that your balance is fine. Move slowly. Avoid sudden body or head movements or certain positions, as told by your health care provider. If you have trouble walking or keeping your balance, try using a cane for stability. If you feel dizzy or unstable, sit down right away. Avoid doing any tasks that would cause danger to you or others if vertigo occurs. Avoid bending down if you feel dizzy. Place items in your home so that they are easy for you to reach without bending or leaning over. Do not drive or use machinery if you feel dizzy. General instructions Take over-the-counter and prescription medicines only as told by your health care provider. Keep all follow-up visits. This is important. Contact a health care provider if: Your medicines do not relieve your vertigo or they make it worse. Your condition gets worse or you develop new symptoms. You have a fever. You develop nausea or vomiting, or if nausea gets worse. Your family or friends notice any behavioral changes. You  have numbness or a prickling and tingling sensation in part of your body. Get help right away if you: Are always dizzy or you faint. Develop severe headaches. Develop a stiff neck. Develop sensitivity to light. Have difficulty moving or speaking. Have weakness in your hands, arms, or legs. Have changes in your hearing or vision. These symptoms may represent a serious problem that is an emergency. Do not wait to see if the symptoms will go away. Get medical help right away. Call your local emergency services (911 in the [place] Do not drive yourself to the hospital. Summary Vertigo is the feeling that you or your surroundings are moving when they are not. Your health care provider will do tests to try to determine the cause of your vertigo. Follow instructions for home care. You may be told to avoid certain tasks, positions, or movements. Contact a health care provider if your medicines do not relieve your symptoms, or if you have a fever, nausea, vomiting, or changes in behavior. Get help right away if you have severe headaches or difficulty speaking, or you develop hearing or vision problems. This information is not intended to replace advice given to you by your health care provider. Make sure you discuss any questions you have with your healthcare provider. Document Revised: 06/06/2020 Document Reviewed: 06/06/2020 Elsevier Patient Education  2022 Elsevier Inc.  

## 2021-07-26 NOTE — Progress Notes (Signed)
Subjective:    Patient ID: Ana King, female    DOB: 10-03-1950, 71 y.o.   MRN: [id number]  HPI Pt is a 71 yo female with HTN, CAD, Emphysema lung, OSA, CKD who presents to the clinic to discuss dizziness.   She has been intermittently dizzy since she messed up and took her pm pills in the am about 5 days ago. She has been taking Antivert and helping. She notice more like the room spinning and not blacking out or syncope. No nausea or vomiting. She has noticed her ankles swelling but HcTZ helps with that. No CP, palpitations. She is always a little SOB.   [name]. 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   Reactive airway disease 06/09/2017   Former smoker 06/09/2017   Chronic venous stasis 12/22/2017   Muscle cramp 12/22/2017   Edema of left ankle 12/22/2017   Osteopenia 03/17/2018   SOB (shortness of breath) on exertion 01/11/2019   Depressed mood 01/11/2019   Right shoulder pain 04/12/2019   Cervical spondylosis 05/10/2019   Non-restorative sleep 08/17/2019   Snoring 08/17/2019   Class 2 obesity due to excess calories without serious comorbidity with body mass index (BMI) of 36.0 to 36.9 in adult 08/17/2019   Hyperlipidemia 08/18/2019   OSA (obstructive sleep apnea) 09/20/2019   Diarrhea 11/08/2019   Plantar fasciitis, left 01/24/2020   Acute intractable headache 01/31/2020   Vision changes 01/31/2020   Non-intractable vomiting 01/31/2020   Eye pain, right 01/31/2020   Herpes zoster with ophthalmic complication [id number]   Hyponatremia 02/20/2020   Hypocalcemia 02/20/2020   Low hemoglobin 02/20/2020   AKI (acute kidney injury) ([place]) 02/21/2020   CKD (chronic kidney disease) stage 3, GFR 30-59 ml/min (HCC) 02/20/2021   Grade I diastolic dysfunction  [id number]   Coronary artery calcification seen on CT scan 04/17/2021   Aortic atherosclerosis ([place]) 04/17/2021   Emphysema lung ([place]) 04/17/2021   Acute pain of left shoulder 06/03/2021   Bilateral leg edema 07/30/2021   Left ear impacted cerumen 07/30/2021   Vertigo 07/30/2021   Resolved Ambulatory Problems    Diagnosis Date Noted   Tobacco abuse 11/02/2013   COPD (chronic obstructive pulmonary disease) ([place]) 11/02/2013   Past Medical History:  Diagnosis Date   Asthma         Review of Systems     Objective:   Physical Exam Vitals reviewed.  Constitutional:      Appearance: Normal appearance.  HENT:     Head: Normocephalic.     Right Ear: Tympanic membrane, ear canal and external ear normal. There is no impacted cerumen.     Left Ear: There is impacted cerumen.     Nose: Nose normal.     Mouth/Throat:     Mouth: Mucous membranes are moist.     Pharynx: No oropharyngeal exudate or posterior oropharyngeal erythema.  Eyes:     Conjunctiva/sclera: Conjunctivae normal.  Neck:     Vascular: No carotid bruit.  Cardiovascular:     Rate and Rhythm: Normal rate and regular rhythm.     Pulses: Normal pulses.     Heart sounds: Normal heart sounds.  Pulmonary:     Effort: Pulmonary effort is normal.     Breath sounds: Normal breath sounds.  Musculoskeletal:     Right  lower leg: No edema.     Left lower leg: No edema.  Lymphadenopathy:     Cervical: No cervical adenopathy.  Neurological:     General: No focal deficit present.     Mental Status: She is alert and oriented to person, place, and time.     Comments: Dix hallpike without any nystagmus but did have some reported dizziness to the right and when sitting up bilaterally.   Psychiatric:        Mood and Affect: Mood normal.     ..Indication: Cerumen impaction of the ear(s)  Medical necessity statement: On physical examination, cerumen impairs clinically significant portions of the external auditory canal, and  tympanic membrane. Noted obstructive, copious cerumen that cannot be removed without magnification and instrumentations requiring physician skills Consent: Discussed benefits and risks of procedure and verbal consent obtained Procedure: Patient was prepped for the procedure. Utilized an otoscope to assess and take note of the ear canal, the tympanic membrane, and the presence, amount, and placement of the cerumen. Gentle water irrigation and soft plastic curette was utilized to remove cerumen.  Post procedure examination: shows cerumen was completely removed. Patient tolerated procedure well. The patient is made aware that they may experience temporary vertigo, temporary hearing loss, and temporary discomfort. If these symptom last for more than 24 hours to call the clinic or proceed to the ED.      Assessment & Plan:  Marland KitchenMarland KitchenShawanna was seen today for dizziness.  Diagnoses and all orders for this visit:  Vertigo  Left ear impacted cerumen  Bilateral leg edema -     hydrochlorothiazide (HYDRODIURIL) 12.5 MG tablet; Take 1 tablet (12.5 mg total) by mouth daily. As needed for lower leg edema.  Centrilobular emphysema (HCC) -     albuterol (PROVENTIL) (2.5 MG/3ML) 0.083% nebulizer solution; Take 3 mLs (2.5 mg total) by nebulization every 6 (six) hours as needed for wheezing or shortness of breath. -     albuterol (VENTOLIN HFA) 108 (90 Base) MCG/ACT inhaler; Inhale 2 puffs into the lungs every 6 (six) hours as needed for wheezing or shortness of breath.   No red flag signs on exam Seems like BPPV more to right Start epley manuevers and antivert as needed If continues consider vestibular rehab  Continue HCTZ for bilateral leg swelling and BP control. Avoid salt. Consider compression stockings  Refilled albuterol for emphysema and sample of trelegy given to make it until her supply comes in [place].

## 2021-07-30 DIAGNOSIS — R42 Dizziness and giddiness: Secondary | ICD-10-CM | POA: Insufficient documentation

## 2021-07-30 DIAGNOSIS — H6122 Impacted cerumen, left ear: Secondary | ICD-10-CM | POA: Insufficient documentation

## 2021-07-30 DIAGNOSIS — R600 Localized edema: Secondary | ICD-10-CM | POA: Insufficient documentation

## 2021-08-14 ENCOUNTER — Other Ambulatory Visit: Payer: Self-pay | Admitting: Physician Assistant

## 2021-08-14 DIAGNOSIS — J4521 Mild intermittent asthma with (acute) exacerbation: Secondary | ICD-10-CM

## 2021-08-27 ENCOUNTER — Other Ambulatory Visit: Payer: Self-pay

## 2021-08-27 ENCOUNTER — Encounter: Payer: Self-pay | Admitting: Physician Assistant

## 2021-08-27 ENCOUNTER — Ambulatory Visit (INDEPENDENT_AMBULATORY_CARE_PROVIDER_SITE_OTHER): Payer: Medicare HMO | Admitting: Physician Assistant

## 2021-08-27 VITALS — BP 142/65 | HR 76 | Temp 98.5°F | Ht 63.0 in | Wt 207.0 lb

## 2021-08-27 DIAGNOSIS — J441 Chronic obstructive pulmonary disease with (acute) exacerbation: Secondary | ICD-10-CM

## 2021-08-27 DIAGNOSIS — I10 Essential (primary) hypertension: Secondary | ICD-10-CM | POA: Diagnosis not present

## 2021-08-27 DIAGNOSIS — Z1231 Encounter for screening mammogram for malignant neoplasm of breast: Secondary | ICD-10-CM | POA: Diagnosis not present

## 2021-08-27 MED ORDER — PREDNISONE 50 MG PO TABS: ORAL_TABLET | ORAL | 0 refills | Status: DC

## 2021-08-27 MED ORDER — AZITHROMYCIN 250 MG PO TABS: ORAL_TABLET | ORAL | 0 refills | Status: DC

## 2021-08-27 MED ORDER — LISINOPRIL 20 MG PO TABS: 20.0000 mg | ORAL_TABLET | Freq: Every day | ORAL | 3 refills | Status: DC

## 2021-08-27 NOTE — Progress Notes (Signed)
Subjective:    Patient ID: Ana King, female    DOB: 04/25/1951, 71 y.o.   MRN: [id number]  HPI Pt is a 71 yo obese female with COPD who presents to the clinic with cough, fever, chills, sinus pressure, SOB for last 4 days. Not smoked in 10 years. On trelegy. Using albuterol twice a day with relief. Taking mucinex and delsym. Her granddaughter and great granddaughter have been sick.   .. 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   Reactive airway disease 06/09/2017   Former smoker 06/09/2017   Chronic venous stasis 12/22/2017   Muscle cramp 12/22/2017   Edema of left ankle 12/22/2017   Osteopenia 03/17/2018   SOB (shortness of breath) on exertion 01/11/2019   Depressed mood 01/11/2019   Right shoulder pain 04/12/2019   Cervical spondylosis 05/10/2019   Non-restorative sleep 08/17/2019   Snoring 08/17/2019   Class 2 obesity due to excess calories without serious comorbidity with body mass index (BMI) of 36.0 to 36.9 in adult 08/17/2019   Hyperlipidemia 08/18/2019   OSA (obstructive sleep apnea) 09/20/2019   Diarrhea 11/08/2019   Plantar fasciitis, left 01/24/2020   Acute intractable headache 01/31/2020   Vision changes 01/31/2020   Non-intractable vomiting 01/31/2020   Eye pain, right 01/31/2020   Herpes zoster with ophthalmic complication [id number]   Hyponatremia 02/20/2020   Hypocalcemia 02/20/2020   Low hemoglobin 02/20/2020   AKI (acute kidney injury) ([place]) 02/21/2020   CKD (chronic kidney disease) stage 3, GFR 30-59 ml/min (HCC) 02/20/2021   Grade I diastolic dysfunction [id number]   Coronary artery calcification seen on CT scan 04/17/2021   Aortic atherosclerosis ([place]) 04/17/2021   Emphysema lung ([place]) 04/17/2021   Acute pain of left shoulder  06/03/2021   Bilateral leg edema 07/30/2021   Left ear impacted cerumen 07/30/2021   Vertigo 07/30/2021   Resolved Ambulatory Problems    Diagnosis Date Noted   Tobacco abuse 11/02/2013   COPD (chronic obstructive pulmonary disease) ([place]) 11/02/2013   Past Medical History:  Diagnosis Date   Asthma      Review of Systems    See HPI.  Objective:   Physical Exam Vitals reviewed.  Constitutional:      Appearance: Normal appearance. She is obese.  HENT:     Head: Normocephalic.     Right Ear: Tympanic membrane, ear canal and external ear normal. There is no impacted cerumen.     Left Ear: Tympanic membrane, ear canal and external ear normal. There is no impacted cerumen.     Nose: Congestion and rhinorrhea present.     Mouth/Throat:     Mouth: Mucous membranes are moist.     Pharynx: Posterior oropharyngeal erythema present.  Eyes:     General:        Right eye: No discharge.        Left eye: No discharge.     Conjunctiva/sclera: Conjunctivae normal.  Neck:     Vascular: No carotid bruit.  Cardiovascular:     Rate and Rhythm: Normal rate and regular rhythm.     Pulses: Normal pulses.     Heart sounds: Murmur heard.  Pulmonary:     Effort: Pulmonary effort is normal.     Comments: Crackles at bilateral lung bases Musculoskeletal:     Cervical back:  Normal range of motion and neck supple. Tenderness present.  Lymphadenopathy:     Cervical: Cervical adenopathy present.  Neurological:     General: No focal deficit present.     Mental Status: She is alert.  Psychiatric:        Mood and Affect: Mood normal.           Assessment & Plan:  Marland KitchenMarland KitchenFerrel was seen today for follow-up.  Diagnoses and all orders for this visit:  COPD exacerbation ([place]) -     predniSONE (DELTASONE) 50 MG tablet; Take one tablet for 5 days. -     azithromycin (ZITHROMAX) 250 MG tablet; 2 tabs po x1 on Day 1, then 1 tab po daily on Days 2 - 5  Essential hypertension, benign -      COMPLETE METABOLIC PANEL WITH GFR -     lisinopril (ZESTRIL) 20 MG tablet; Take 1 tablet (20 mg total) by mouth daily.  Encounter for screening mammogram for malignant neoplasm of breast -     MM 3D SCREEN BREAST BILATERAL   COPD exacerbation Start zpak and prednisone Deslym for cough Albuterol as needed every 4 hours Trelegy daily Follow up as needed or if symptoms worsen  BP a little elevated today but sick Sent lisinopril to pharmacy Cmp ordered  Follow up in 6 months.

## 2021-08-27 NOTE — Patient Instructions (Signed)
Start prednisone and zpak.   Chronic Obstructive Pulmonary Disease Exacerbation Chronic obstructive pulmonary disease (COPD) is a long-term (chronic) condition that affects the lungs. COPD is a general term that can be used to describe many different lung problems that cause lung inflammation and limit airflow, including chronic bronchitis and emphysema. COPD exacerbations are episodes when breathing symptoms flare up, become much worse, and require extra treatment. COPD exacerbations are usually caused by infections. Without treatment, COPD exacerbations can be severe and even life threatening. Frequent COPD exacerbations can cause further damage to the lungs. What are the causes? This condition may be caused by: Respiratory infections, including viral and bacterial infections. Exposure to smoke. Exposure to air pollution, chemical fumes, or dust. Things that can cause an allergic reaction (allergens). Not taking your usual COPD medicines as directed. Underlying medical problems, such as congestive heart failure or infections not involving the lungs. In many cases, the cause of this condition is not known. What increases the risk? The following factors may make you more likely to develop this condition: Smoking cigarettes. Being an older adult. Having frequent prior COPD exacerbations. What are the signs or symptoms? Symptoms of this condition include: Increased coughing. Increased production of mucus from your lungs. Increased wheezing and shortness of breath. Rapid or labored breathing. Chest tightness. Less energy than usual. Sleep disruption from symptoms. Confusion Increased sleepiness. Often, these symptoms happen or get worse even with the use of medicines. How is this diagnosed? This condition is diagnosed based on: Your medical history. A physical exam. You may also have tests, including: A chest X-ray. Blood tests. Lung (pulmonary) function tests. How is this  treated? Treatment for this condition depends on the severity and cause of the symptoms. You may need to be admitted to a hospital for treatment. Some of the treatments commonly used to treat COPD exacerbations are: Antibiotic medicines. These may be used for severe exacerbations caused by a lung infection, such as pneumonia. Bronchodilators. These are inhaled medicines that expand the air passages and allow increased airflow. They may make your breathing more comfortable. Steroid medicines. These act to reduce inflammation in the airways. They may be given with an inhaler, taken by mouth, or given through an IV tube inserted into one of your veins. Supplemental oxygen therapy. Airway clearing techniques, such as noninvasive ventilation (NIV) and positive expiratory pressure (PEP). These provide respiratory support through a mask or other noninvasive device. An example of this would be using a continuous positive airway pressure (CPAP) machine to improve delivery of oxygen into your lungs. Follow these instructions at home: Medicines Take over-the-counter and prescription medicines only as told by your health care provider. It is important to use correct technique with inhaled medicines. If you were prescribed an antibiotic medicine or oral steroid, take it as told by your health care provider. Do not stop taking the medicine even if you start to feel better. Lifestyle Do not use any products that contain nicotine or tobacco. These products include cigarettes, chewing tobacco, and vaping devices, such as e-cigarettes. If you need help quitting, ask your health care provider. Eat a healthy diet. Exercise regularly. Get enough sleep. Most adults need 7 or more hours per night. Avoid exposure to all substances that irritate the airway, especially tobacco smoke. Regularly wash your hands with soap and water for at least 20 seconds. If soap and water are not available, use hand sanitizer. This may help  prevent you from getting infections. During flu season, avoid enclosed spaces  that are crowded with people. General instructions Drink enough fluid to keep your urine pale yellow, unless you have a medical condition that requires fluid restriction. Use a cool mist vaporizer. This humidifies the air and makes it easier for you to clear your chest when you cough. If you have a home nebulizer and oxygen, continue to use them as told by your health care provider. Keep all follow-up visits. This is important. How is this prevented? Stay up-to-date on pneumococcal and flu (influenza) vaccines. A flu shot is recommended every year to help prevent exacerbations. Quitting smoking is very important in preventing COPD from getting worse and in preventing exacerbations from happening as often. Follow all instructions for pulmonary rehabilitation after a recent exacerbation. This can help prevent future exacerbations. Work with your health care provider to develop and follow an action plan. This tells you what steps to take when you experience certain symptoms. Contact a health care provider if: You have a worsening of your regular COPD symptoms. Get help right away if: You have worsening shortness of breath, even when resting. You have trouble talking. You have severe chest pain. You cough up blood. You have a fever. You have weakness, vomit repeatedly, or faint. You feel confused. You are not able to sleep because of your symptoms. You have trouble doing daily activities. These symptoms may represent a serious problem that is an emergency. Do not wait to see if the symptoms will go away. Get medical help right away. Call your local emergency services (911 in the [place] Do not drive yourself to the hospital. Summary COPD exacerbations are episodes when breathing symptoms become much worse and require extra treatment above your normal treatment. Exacerbations can be severe and even life threatening.  Frequent COPD exacerbations can cause further damage to your lungs. COPD exacerbations are usually triggered by infections such as the flu, colds, and even pneumonia. Treatment for this condition depends on the severity and cause of the symptoms. You may need to be admitted to a hospital for treatment. Quitting smoking is very important to prevent COPD from getting worse and to prevent exacerbations from happening as often. This information is not intended to replace advice given to you by your health care provider. Make sure you discuss any questions you have with your health care provider. Document Revised: 05/15/2020 Document Reviewed: 05/15/2020 Elsevier Patient Education  2022 [employer].

## 2021-08-28 LAB — COMPLETE METABOLIC PANEL WITH GFR
AG Ratio: 1.6 (calc) (ref 1.0–2.5)
ALT: 8 U/L (ref 6–29)
AST: 12 U/L (ref 10–35)
Albumin: 3.9 g/dL (ref 3.6–5.1)
Alkaline phosphatase (APISO): 83 U/L (ref 37–153)
BUN/Creatinine Ratio: 11 (calc) (ref 6–22)
BUN: 13 mg/dL (ref 7–25)
CO2: 26 mmol/L (ref 20–32)
Calcium: 9.6 mg/dL (ref 8.6–10.4)
Chloride: 102 mmol/L (ref 98–110)
Creat: 1.17 mg/dL — ABNORMAL HIGH (ref 0.50–1.05)
Globulin: 2.5 g/dL (calc) (ref 1.9–3.7)
Glucose, Bld: 87 mg/dL (ref 65–99)
Potassium: 5.2 mmol/L (ref 3.5–5.3)
Sodium: 136 mmol/L (ref 135–146)
Total Bilirubin: 0.6 mg/dL (ref 0.2–1.2)
Total Protein: 6.4 g/dL (ref 6.1–8.1)
eGFR: 51 mL/min/{1.73_m2} — ABNORMAL LOW (ref >=60)

## 2021-08-28 NOTE — Progress Notes (Signed)
GFR decreased a little but you are sick.will continue to monitor.

## 2021-08-30 ENCOUNTER — Telehealth: Payer: Self-pay | Admitting: Neurology

## 2021-08-30 MED ORDER — BREZTRI AEROSPHERE 160-9-4.8 MCG/ACT IN AERO: 2.0000 | INHALATION_SPRAY | Freq: Two times a day (BID) | RESPIRATORY_TRACT | 3 refills | Status: DC

## 2021-08-30 NOTE — Telephone Encounter (Signed)
Paperwork completed to [facility] for [employer] assistance [employer] written) and faxed to [phone] with confirmation received.

## 2021-09-17 ENCOUNTER — Telehealth: Payer: Self-pay | Admitting: Physician Assistant

## 2021-09-17 NOTE — Telephone Encounter (Signed)
How is patient?  Make sure taking singuliar every night.  Is she doing duoneb at least 3 times a day in flares of SOB.  Let me know her pulse ox?

## 2021-09-23 ENCOUNTER — Ambulatory Visit (INDEPENDENT_AMBULATORY_CARE_PROVIDER_SITE_OTHER): Payer: Medicare HMO

## 2021-09-23 ENCOUNTER — Ambulatory Visit (INDEPENDENT_AMBULATORY_CARE_PROVIDER_SITE_OTHER): Payer: Medicare HMO | Admitting: Physician Assistant

## 2021-09-23 ENCOUNTER — Other Ambulatory Visit: Payer: Self-pay

## 2021-09-23 ENCOUNTER — Encounter: Payer: Self-pay | Admitting: Physician Assistant

## 2021-09-23 VITALS — BP 134/65 | HR 77 | Ht 63.0 in | Wt 203.0 lb

## 2021-09-23 DIAGNOSIS — J441 Chronic obstructive pulmonary disease with (acute) exacerbation: Secondary | ICD-10-CM

## 2021-09-23 DIAGNOSIS — R052 Subacute cough: Secondary | ICD-10-CM

## 2021-09-23 DIAGNOSIS — R0602 Shortness of breath: Secondary | ICD-10-CM

## 2021-09-23 DIAGNOSIS — J432 Centrilobular emphysema: Secondary | ICD-10-CM | POA: Diagnosis not present

## 2021-09-23 DIAGNOSIS — R059 Cough, unspecified: Secondary | ICD-10-CM | POA: Diagnosis not present

## 2021-09-23 MED ORDER — PREDNISONE 20 MG PO TABS: ORAL_TABLET | ORAL | 0 refills | Status: DC

## 2021-09-23 MED ORDER — TRELEGY ELLIPTA 100-62.5-25 MCG/ACT IN AEPB: 1.0000 | INHALATION_SPRAY | Freq: Every day | RESPIRATORY_TRACT | 4 refills | Status: DC

## 2021-09-23 NOTE — Progress Notes (Signed)
? ?Subjective:  ? ? Patient ID: Ana King, female    DOB: [birth year], 71 y.o.   MRN: [id number] ? ?Cough ?Associated symptoms include chest pain, myalgias (Upper back pain) and shortness of breath. Pertinent negatives include no fever.  ? ?This is a 71 year old female with history of COPD presenting with a 4-5 week history of cough, 1 week history of SOB, and 3-4 days of chest tightness and upper back pain. ?She was seen previously for the cough in the beginning of February and managed with a Z-pak and Prednisone dose pack. She states although she improved, the cough never went completely away.she has not felt the same since she switched from trelegy to breztri. She would like to go back on trelegy again. She continues to take singulair daily.  ? ?.. ?Active Ambulatory Problems  ?  Diagnosis Date Noted  ? Anxiety state 11/02/2013  ? GERD (gastroesophageal reflux disease) 11/02/2013  ? Hiatal hernia 12/09/2013  ? Essential hypertension, benign 08/28/2015  ? Contusion 11/13/2015  ? 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 Reactive airway disease 06/09/2017  ? Former smoker 06/09/2017  ? Chronic venous stasis 12/22/2017  ? Muscle cramp 12/22/2017  ? Edema of left ankle 12/22/2017  ? Osteopenia 03/17/2018  ? SOB (shortness of breath) on exertion 01/11/2019  ? Depressed mood 01/11/2019  ? Right shoulder pain 04/12/2019  ? Cervical spondylosis 05/10/2019  ? Non-restorative sleep 08/17/2019  ? Snoring 08/17/2019  ? Class 2 obesity due to excess calories without serious comorbidity with body mass index (BMI) of 36.0 to 36.9 in adult 08/17/2019  ? Hyperlipidemia 08/18/2019  ? OSA (obstructive sleep apnea) 09/20/2019  ? Diarrhea 11/08/2019  ? Plantar fasciitis, left 01/24/2020  ? Acute intractable headache 01/31/2020  ? Vision changes 01/31/2020  ? Non-intractable vomiting 01/31/2020  ? Eye pain, right 01/31/2020  ? Herpes zoster with ophthalmic complication [id number]  ? Hyponatremia 02/20/2020  ? Hypocalcemia 02/20/2020  ? Low  hemoglobin 02/20/2020  ? AKI (acute kidney injury) ([place]) 02/21/2020  ? CKD (chronic kidney disease) stage 3, GFR 30-59 ml/min (HCC) 02/20/2021  ? Grade I diastolic dysfunction [id number]  ? Coronary artery calcification seen on CT scan 04/17/2021  ? Aortic atherosclerosis ([place]) 04/17/2021  ? Centrilobular emphysema ([place]) 04/17/2021  ? Acute pain of left shoulder 06/03/2021  ? Bilateral leg edema 07/30/2021  ? Left ear impacted cerumen 07/30/2021  ? Vertigo 07/30/2021  ? ?Resolved Ambulatory Problems  ?  Diagnosis Date Noted  ? Tobacco abuse 11/02/2013  ? COPD (chronic obstructive pulmonary disease) ([place]) 11/02/2013  ? ?Past Medical History:  ?Diagnosis Date  ? Asthma   ? ? ? ? ?Review of Systems  ?Constitutional: Negative.  Negative for fatigue and fever.  ?HENT:  Negative for sinus pressure.   ?Respiratory:  Positive for cough ([place]), chest tightness and shortness of breath.   ?Cardiovascular:  Positive for chest pain.  ?Gastrointestinal: Negative.  Negative for abdominal pain, constipation and diarrhea.  ?Genitourinary: Negative.   ?Musculoskeletal:  Positive for myalgias (Upper back pain).  ? ?   ?Objective:  ? Physical Exam ?Constitutional:   ?   General: She is not in acute distress. ?   Appearance: Normal appearance.  ?HENT:  ?   Head: Normocephalic.  ?Cardiovascular:  ?   Rate and Rhythm: Normal rate and regular rhythm.  ?Pulmonary:  ?   Effort: Pulmonary effort is normal. No respiratory distress.  ?   Breath sounds: Rhonchi (scattered) present. No wheezing.  ?Neurological:  ?  Mental Status: She is alert and oriented to person, place, and time.  ?Psychiatric:     ?   Mood and Affect: Mood normal.     ?   Behavior: Behavior normal.  ? ? ?   ?Assessment & Plan:  ? ?Marland KitchenNeoma Laming was seen today for cough. ? ?Diagnoses and all orders for this visit: ? ?Subacute cough ?-     DG Chest 2 View; Future ? ?SOB (shortness of breath) ?-     DG Chest 2 View; Future ? ?Centrilobular emphysema ([place]) ? ? ?CXR normal with  no acute findings.  ?Sent longer prednisone taper ?Use duoneb 3 times a day ?Stop breztri start trelegy ?Follow up as needed or if symptoms worsen ? ?

## 2021-09-30 ENCOUNTER — Other Ambulatory Visit: Payer: Self-pay | Admitting: Physician Assistant

## 2021-09-30 MED ORDER — IPRATROPIUM-ALBUTEROL 0.5-2.5 (3) MG/3ML IN SOLN: 3.0000 mL | RESPIRATORY_TRACT | 3 refills | Status: AC | PRN

## 2021-10-03 ENCOUNTER — Ambulatory Visit: Payer: Medicare HMO

## 2021-10-14 ENCOUNTER — Other Ambulatory Visit: Payer: Self-pay

## 2021-10-14 ENCOUNTER — Encounter: Payer: Self-pay | Admitting: Physician Assistant

## 2021-10-14 ENCOUNTER — Ambulatory Visit (INDEPENDENT_AMBULATORY_CARE_PROVIDER_SITE_OTHER): Payer: Medicare HMO

## 2021-10-14 ENCOUNTER — Ambulatory Visit (INDEPENDENT_AMBULATORY_CARE_PROVIDER_SITE_OTHER): Payer: Medicare HMO | Admitting: Physician Assistant

## 2021-10-14 VITALS — BP 156/62 | HR 63 | Ht 63.0 in | Wt 203.0 lb

## 2021-10-14 DIAGNOSIS — S8011XA Contusion of right lower leg, initial encounter: Secondary | ICD-10-CM | POA: Diagnosis not present

## 2021-10-14 DIAGNOSIS — S8991XA Unspecified injury of right lower leg, initial encounter: Secondary | ICD-10-CM | POA: Insufficient documentation

## 2021-10-14 NOTE — Progress Notes (Signed)
? ?  Subjective:  ? ? Patient ID: Ana King, female    DOB: 1950-09-12, 71 y.o.   MRN: [id number] ? ?HPI ?Pt is a 71 yo female who presents to the clinic to follow up after hitting her right anterior leg on a table. It has bruised and still very tender 1 week later. She is concerned. She had to take a tramadol once because it ached so bad. No calf tenderness or swelling. No SOB.  ? ? ?.. ?Active Ambulatory Problems  ?  Diagnosis Date Noted  ? Anxiety state 11/02/2013  ? GERD (gastroesophageal reflux disease) 11/02/2013  ? Hiatal hernia 12/09/2013  ? Essential hypertension, benign 08/28/2015  ? Contusion 11/13/2015  ? 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 Reactive airway disease 06/09/2017  ? Former smoker 06/09/2017  ? Chronic venous stasis 12/22/2017  ? Muscle cramp 12/22/2017  ? Edema of left ankle 12/22/2017  ? Osteopenia 03/17/2018  ? SOB (shortness of breath) on exertion 01/11/2019  ? Depressed mood 01/11/2019  ? Right shoulder pain 04/12/2019  ? Cervical spondylosis 05/10/2019  ? Non-restorative sleep 08/17/2019  ? Snoring 08/17/2019  ? Class 2 obesity due to excess calories without serious comorbidity with body mass index (BMI) of 36.0 to 36.9 in adult 08/17/2019  ? Hyperlipidemia 08/18/2019  ? OSA (obstructive sleep apnea) 09/20/2019  ? Diarrhea 11/08/2019  ? Plantar fasciitis, left 01/24/2020  ? Acute intractable headache 01/31/2020  ? Vision changes 01/31/2020  ? Non-intractable vomiting 01/31/2020  ? Eye pain, right 01/31/2020  ? Herpes zoster with ophthalmic complication [id number]  ? Hyponatremia 02/20/2020  ? Hypocalcemia 02/20/2020  ? Low hemoglobin 02/20/2020  ? AKI (acute kidney injury) ([place]) 02/21/2020  ? CKD (chronic kidney disease) stage 3, GFR 30-59 ml/min (HCC) 02/20/2021  ? Grade I diastolic dysfunction [id number]  ? Coronary artery calcification seen on CT scan 04/17/2021  ? Aortic atherosclerosis ([place]) 04/17/2021  ? Centrilobular emphysema ([place]) 04/17/2021  ? Acute pain of left shoulder  06/03/2021  ? Bilateral leg edema 07/30/2021  ? Left ear impacted cerumen 07/30/2021  ? Vertigo 07/30/2021  ? Injury of right leg 10/14/2021  ? ?Resolved Ambulatory Problems  ?  Diagnosis Date Noted  ? Tobacco abuse 11/02/2013  ? COPD (chronic obstructive pulmonary disease) ([place]) 11/02/2013  ? ?Past Medical History:  ?Diagnosis Date  ? Asthma   ? ? ?Review of Systems ?See HPI.  ?   ?Objective:  ? Physical Exam ?Vitals reviewed.  ?Constitutional:   ?   Appearance: Normal appearance. She is obese.  ?Cardiovascular:  ?   Rate and Rhythm: Normal rate.  ?Musculoskeletal:  ?   Right lower leg: No edema.  ?   Left lower leg: No edema.  ?   Comments: Bruising over right anterior leg just under the right knee with tenderness and palpation of step off.  ?No calf tenderness, swelling or erythema  ?Neurological:  ?   Mental Status: She is alert.  ? ? ? ? ? ?   ?Assessment & Plan:  ? ?Marland KitchenNeoma King was seen today for leg pain. ? ?Diagnoses and all orders for this visit: ? ?Injury of right lower extremity, initial encounter ?-     DG Tibia/Fibula Right; Future ? ? ?Xray to confirm no fracture ?Discussed contusion with patient  ?Ice, rest, NSAID, compression for next week follow up as needed or if symptoms worsen ?

## 2021-10-14 NOTE — Progress Notes (Signed)
No fracture so it is just a bad contusion. It should start to improve in the next week.

## 2021-10-14 NOTE — Patient Instructions (Signed)
Contusion A contusion is a deep bruise. Contusions are the result of a blunt injury to tissues and muscle fibers under the skin. The injury causes bleeding under the skin. The skin overlying the contusion may turn blue, purple, or yellow. Minor injuries will give you a painless contusion, but more severe injuries causecontusions that may stay painful and swollen for a few weeks. Follow these instructions at home: Pay attention to any changes in your symptoms. Let your health care providerknow about them. Take these actions to relieve your pain. Managing pain, stiffness, and swelling  Use resting, icing, applying pressure (compression), and raising (elevating) the injured area. This is often called the RICE strategy. Rest the injured area. Return to your normal activities as told by your health care provider. Ask your health care provider what activities are safe for you. If directed, put ice on the injured area: Put ice in a plastic bag. Place a towel between your skin and the bag. Leave the ice on for 20 minutes, 2-3 times per day. If directed, apply light compression to the injured area using an elastic bandage. Make sure the bandage is not wrapped too tightly. Remove and reapply the bandage as directed by your health care provider. If possible, raise (elevate) the injured area above the level of your heart while you are sitting or lying down.  General instructions Take over-the-counter and prescription medicines only as told by your health care provider. Keep all follow-up visits as told by your health care provider. This is important. Contact a health care provider if: Your symptoms do not improve after several days of treatment. Your symptoms get worse. You have difficulty moving the injured area. Get help right away if: You have severe pain. You have numbness in a hand or foot. Your hand or foot turns pale or cold. Summary A contusion is a deep bruise. Contusions are the result of a  blunt injury to tissues and muscle fibers under the skin. It is treated with rest, ice, compression, and elevation. You may be given over-the-counter medicines for pain. Contact a health care provider if your symptoms do not improve, or get worse. Get help right away if you have severe pain, have numbness, or the area turns pale or cold. This information is not intended to replace advice given to you by your health care provider. Make sure you discuss any questions you have with your healthcare provider. Document Revised: 02/25/2018 Document Reviewed: 02/25/2018 Elsevier Patient Education  2022 [employer]  

## 2021-10-31 ENCOUNTER — Ambulatory Visit: Payer: Medicare HMO

## 2021-11-08 ENCOUNTER — Ambulatory Visit (INDEPENDENT_AMBULATORY_CARE_PROVIDER_SITE_OTHER): Payer: Medicare HMO | Admitting: Physician Assistant

## 2021-11-08 DIAGNOSIS — Z1231 Encounter for screening mammogram for malignant neoplasm of breast: Secondary | ICD-10-CM | POA: Diagnosis not present

## 2021-11-08 DIAGNOSIS — Z0000 Encounter for general adult medical examination without abnormal findings: Secondary | ICD-10-CM

## 2021-11-08 DIAGNOSIS — Z780 Asymptomatic menopausal state: Secondary | ICD-10-CM | POA: Diagnosis not present

## 2021-11-08 NOTE — Progress Notes (Signed)
? ? ?MEDICARE ANNUAL WELLNESS VISIT ? ?11/08/2021 ? ?Telephone Visit Disclaimer ?This Medicare AWV was conducted by telephone due to national recommendations for restrictions regarding the COVID-19 Pandemic (e.g. social distancing).  I verified, using two identifiers, that I am speaking with Ana King or their authorized healthcare agent. I discussed the limitations, risks, security, and privacy concerns of performing an evaluation and management service by telephone and the potential availability of an in-person appointment in the future. The patient expressed understanding and agreed to proceed.  ?Location of Patient: Home ?Location of Provider (nurse):  In the office. ? ?Subjective:  ? ? ?Ana King is a 71 y.o. female patient of Ana Stade, PA-C who had a [employer] Visit today via telephone. Ana King is Retired and lives with their spouse. she has 2 children. she reports that she is socially active and does interact with friends/family regularly. she is minimally physically active and enjoys going to bingo and spending time with her grandchildren. ? ?Patient Care Team: ?Ana King as PCP - General (Family Medicine) ?Ana King, [facility] as Pharmacist (Pharmacist) ? ? ?  11/08/2021  ?  1:35 PM 08/27/2020  ?  1:56 PM 05/03/2018  ?  3:43 PM 11/02/2013  ?  3:24 PM  ?Advanced Directives  ?Does Patient Have a Medical Advance Directive? No No No Patient does not have advance directive;Patient would like information  ?Would patient like information on creating a medical advance directive? No - Patient declined No - Patient declined Yes (MAU/Ambulatory/Procedural Areas - Information given)   ? ? ?Hospital Utilization Over the Past 12 Months: ?# of hospitalizations or ER visits: 0 ?# of surgeries: 0 ? ?Review of Systems    ?Patient reports that her overall health is unchanged compared to last year. ? ?History obtained from chart review and the patient ? ?Patient Reported Readings  (BP, Pulse, CBG, Weight, etc) ?none ? ?Pain Assessment ?Pain : No/denies pain ? ?  ? ?Current Medications & Allergies (verified) ?Allergies as of 11/08/2021   ? ?   Reactions  ? Penicillins Hives  ? ?  ? ?  ?Medication List  ?  ? ?  ? Accurate as of November 08, 2021  1:52 PM. If you have any questions, ask your nurse or doctor.  ?  ?  ? ?  ? ?albuterol (2.5 MG/3ML) 0.083% nebulizer solution ?Commonly known as: PROVENTIL ?Take 3 mLs (2.5 mg total) by nebulization every 6 (six) hours as needed for wheezing or shortness of breath. ?  ?albuterol 108 (90 Base) MCG/ACT inhaler ?Commonly known as: VENTOLIN HFA ?Inhale 2 puffs into the lungs every 6 (six) hours as needed for wheezing or shortness of breath. ?  ?atorvastatin 40 MG tablet ?Commonly known as: LIPITOR ?Take 1 tablet (40 mg total) by mouth daily. ?  ?CALCIUM 600 PO ?Take 1 tablet by mouth in the morning and at bedtime. ?  ?cholecalciferol 25 MCG (1000 UNIT) tablet ?Commonly known as: VITAMIN D3 ?Take 1,000 Units by mouth daily. ?  ?dicyclomine 10 MG capsule ?Commonly known as: Bentyl ?Take 1 capsule (10 mg total) by mouth 3 (three) times daily before meals. ?  ?hydrochlorothiazide 12.5 MG tablet ?Commonly known as: HYDRODIURIL ?Take 1 tablet (12.5 mg total) by mouth daily. As needed for lower leg edema. ?  ?ipratropium-albuterol 0.5-2.5 (3) MG/3ML Soln ?Commonly known as: DUONEB ?Take 3 mLs by nebulization every 2 (two) hours as needed (wheeze, SOB). ?  ?lansoprazole 30 MG capsule ?Commonly known as: Prevacid ?  Take 1 capsule (30 mg total) by mouth daily at 12 noon. ?  ?lisinopril 20 MG tablet ?Commonly known as: ZESTRIL ?Take 1 tablet (20 mg total) by mouth daily. ?  ?meclizine 25 MG tablet ?Commonly known as: ANTIVERT ?Take 1 tablet (25 mg total) by mouth 3 (three) times daily as needed for dizziness. ?  ?meloxicam 15 MG tablet ?Commonly known as: MOBIC ?Take 1 tablet by mouth once daily ?  ?montelukast 10 MG tablet ?Commonly known as: SINGULAIR ?TAKE 1 TABLET AT  BEDTIME ?  ?ondansetron 8 MG tablet ?Commonly known as: Zofran ?Take 1 tablet (8 mg total) by mouth every 8 (eight) hours as needed for nausea or vomiting. ?  ?predniSONE 20 MG tablet ?Commonly known as: DELTASONE ?Take 3 tablets for 3 days, take 2 tablets for 3 days, take 1 tablet for 3 days, take 1/2 tablet for 4 days. ?  ?rizatriptan 10 MG disintegrating tablet ?Commonly known as: Maxalt-MLT ?Take 1 tablet (10 mg total) by mouth as needed for migraine. May repeat in 2 hours if needed ?  ?Trelegy Ellipta 200-62.5-25 MCG/ACT Aepb ?Generic drug: Fluticasone-Umeclidin-Vilant ?Inhale into the lungs. ?  ?Trelegy Ellipta 100-62.5-25 MCG/ACT Aepb ?Generic drug: Fluticasone-Umeclidin-Vilant ?Inhale 1 puff into the lungs daily. ?  ?vitamin C 500 MG tablet ?Commonly known as: ASCORBIC ACID ?Take 500 mg by mouth daily. ?  ? ?  ? ? ?History (reviewed): ?Past Medical History:  ?Diagnosis Date  ? Asthma   ? Essential hypertension, benign 08/28/2015  ? GERD (gastroesophageal reflux disease)   ? ?Past Surgical History:  ?Procedure Laterality Date  ? ABDOMINAL HYSTERECTOMY    ? ?Family History  ?Problem Relation Age of Onset  ? Cancer Mother   ? Diabetes Maternal Aunt   ? Hypertension Maternal Aunt   ? Cancer Maternal Grandmother   ? Diabetes Maternal Grandmother   ? ?Social History  ? ?Socioeconomic History  ? Marital status: Married  ?  Spouse name: Iona Beard  ? Number of children: 2  ? Years of education: 10th  ? Highest education level: 10th grade  ?Occupational History  ? Occupation: retired  ?  Comment: housewife  ?Tobacco Use  ? Smoking status: Former  ?  Types: Cigarettes  ?  Quit date: 05/21/2014  ?  Years since quitting: 7.4  ? Smokeless tobacco: Never  ?Vaping Use  ? Vaping Use: Never used  ?Substance and Sexual Activity  ? Alcohol use: Never  ?  Alcohol/week: 0.0 standard drinks  ? Drug use: Never  ? Sexual activity: Not Currently  ?Other Topics Concern  ? Not on file  ?Social History Narrative  ? Lives with her husband. She  has two children. She enjoys playing bingo and spending time with her grand kids.  ? ?Social Determinants of Health  ? ?Financial Resource Strain: Low Risk   ? Difficulty of Paying Living Expenses: Not hard at all  ?Food Insecurity: No Food Insecurity  ? Worried About Charity fundraiser in the Last Year: Never true  ? Ran Out of Food in the Last Year: Never true  ?Transportation Needs: No Transportation Needs  ? Lack of Transportation (Medical): No  ? Lack of Transportation (Non-Medical): No  ?Physical Activity: Inactive  ? Days of Exercise per Week: 0 days  ? Minutes of Exercise per Session: 0 min  ?Stress: No Stress Concern Present  ? Feeling of Stress : Not at all  ?Social Connections: Moderately Integrated  ? Frequency of Communication with Friends and Family: Twice a  week  ? Frequency of Social Gatherings with Friends and Family: Once a week  ? Attends Religious Services: More than 4 times per year  ? Active Member of Clubs or Organizations: No  ? Attends Archivist Meetings: Never  ? Marital Status: Married  ? ? ?Activities of Daily Living ? ?  11/08/2021  ?  1:39 PM  ?In your present state of health, do you have any difficulty performing the following activities:  ?Hearing? 0  ?Vision? 0  ?Difficulty concentrating or making decisions? 0  ?Walking or climbing stairs? 1  ?Comment climbing stairs as it causes shortness of breath.  ?Dressing or bathing? 0  ?Doing errands, shopping? 0  ?Preparing Food and eating ? N  ?Using the Toilet? N  ?In the past six months, have you accidently leaked urine? Y  ?Comment stress incontinence  ?Do you have problems with loss of bowel control? N  ?Managing your Medications? N  ?Managing your Finances? N  ?Housekeeping or managing your Housekeeping? N  ? ? ?Patient Education/ Literacy ?How often do you need to have someone help you when you read instructions, pamphlets, or other written materials from your doctor or pharmacy?: 1 - Never ?What is the last grade level you  completed in school?: 10th ? ?Exercise ?Current Exercise Habits: The patient does not participate in regular exercise at present, Exercise limited by: None identified ? ?Diet ?Patient reports consuming

## 2021-11-08 NOTE — Patient Instructions (Addendum)
?MEDICARE ANNUAL WELLNESS VISIT ?Health Maintenance Summary and Written Plan of Care ? ?Ana King , ? ?Thank you for allowing me to perform your Medicare Annual Wellness Visit and for your ongoing commitment to your health.  ? ?Health Maintenance & Immunization History ?Health Maintenance  ?Topic Date Due  ? MAMMOGRAM  11/12/2021 (Originally 03/17/2020)  ? COVID-19 Vaccine (3 - Booster for Pfizer series) 11/24/2021 (Originally 06/23/2020)  ? COLONOSCOPY (Pts 45-56yrs Insurance coverage will need to be confirmed)  02/19/2022 (Originally 01/05/1997)  ? INFLUENZA VACCINE  02/18/2022  ? TETANUS/TDAP  03/02/2028  ? Pneumonia Vaccine 15+ Years old  Completed  ? DEXA SCAN  Completed  ? Hepatitis C Screening  Completed  ? Zoster Vaccines- Shingrix  Completed  ? HPV VACCINES  Aged Out  ? ?Immunization History  ?Administered Date(s) Administered  ? Fluad Quad(high Dose 65+) 03/18/2019, 05/01/2020, 04/15/2021  ? Influenza,inj,Quad PF,6+ Mos 05/24/2014, 05/07/2015, 05/13/2016, 03/24/2017, 05/03/2018  ? PFIZER(Purple Top)SARS-COV-2 Vaccination 03/07/2020, 04/28/2020  ? Pneumococcal Conjugate-13 03/24/2017  ? Pneumococcal Polysaccharide-23 05/03/2018  ? Tdap 03/02/2018  ? Zoster Recombinat (Shingrix) 10/20/2019, 05/01/2020  ? ? ?These are the patient goals that we discussed: ? Goals Addressed   ? ?  ?  ?  ?  ?  ? This Visit's Progress  ?   Patient Stated (pt-stated)     ?   Being able to breathe a little better. ?  ? ?  ?  ? ?This is a list of Health Maintenance Items that are overdue or due now: ?Screening mammography ?Bone densitometry screening ?Colorectal cancer screening ? ?Orders/Referrals Placed Today: ?Orders Placed This Encounter  ?Procedures  ? DEXAScan  ?  Standing Status:   Future  ?  Standing Expiration Date:   11/09/2022  ?  Scheduling Instructions:  ?   Please call patient to schedule  ?  Order Specific Question:   Reason for exam:  ?  Answer:   Post Menopausal  ?  Order Specific Question:   Preferred imaging  location?  ?  Answer:   [facility]  ? Mammogram 3D SCREEN BREAST BILATERAL  ?  Standing Status:   Future  ?  Standing Expiration Date:   11/09/2022  ?  Scheduling Instructions:  ?   Please call patient to schedule  ?  Order Specific Question:   Reason for Exam (SYMPTOM  OR DIAGNOSIS REQUIRED)  ?  Answer:   Breast cancer screening  ?  Order Specific Question:   Preferred imaging location?  ?  Answer:   [facility]  ? ?(Contact our referral department at [phone] if you have not spoken with someone about your referral appointment within the next 5 days)  ? ? ?Follow-up Plan ?Follow-up with Donella Stade, PA-C as planned ?Referral for your bone density and mammogram has been sent and they will call you to schedule.  ?Please let us know if you change your mind about the colorectal cancer screening. ?Medicare wellness visit in one year. ?Patient will access AVS on my chart. ? ? ? ?  ?Health Maintenance, Female ?Adopting a healthy lifestyle and getting preventive care are important in promoting health and wellness. Ask your health care provider about: ?The right schedule for you to have regular tests and exams. ?Things you can do on your own to prevent diseases and keep yourself healthy. ?What should I know about diet, weight, and exercise? ?Eat a healthy diet ? ?Eat a diet that includes plenty of vegetables, fruits, low-fat dairy products, and lean protein. ?  Do not eat a lot of foods that are high in solid fats, added sugars, or sodium. ?Maintain a healthy weight ?Body mass index (BMI) is used to identify weight problems. It estimates body fat based on height and weight. Your health care provider can help determine your BMI and help you achieve or maintain a healthy weight. ?Get regular exercise ?Get regular exercise. This is one of the most important things you can do for your health. Most adults should: ?Exercise for at least 150 minutes each week. The exercise should increase your heart  rate and make you sweat (moderate-intensity exercise). ?Do strengthening exercises at least twice a week. This is in addition to the moderate-intensity exercise. ?Spend less time sitting. Even light physical activity can be beneficial. ?Watch cholesterol and blood lipids ?Have your blood tested for lipids and cholesterol at 70 years of age, then have this test every 5 years. ?Have your cholesterol levels checked more often if: ?Your lipid or cholesterol levels are high. ?You are older than 70 years of age. ?You are at high risk for heart disease. ?What should I know about cancer screening? ?Depending on your health history and family history, you may need to have cancer screening at various ages. This may include screening for: ?Breast cancer. ?Cervical cancer. ?Colorectal cancer. ?Skin cancer. ?Lung cancer. ?What should I know about heart disease, diabetes, and high blood pressure? ?Blood pressure and heart disease ?High blood pressure causes heart disease and increases the risk of stroke. This is more likely to develop in people who have high blood pressure readings or are overweight. ?Have your blood pressure checked: ?Every 3-5 years if you are 69-70 years of age. ?Every year if you are 21 years old or older. ?Diabetes ?Have regular diabetes screenings. This checks your fasting blood sugar level. Have the screening done: ?Once every three years after age 42 if you are at a normal weight and have a low risk for diabetes. ?More often and at a younger age if you are overweight or have a high risk for diabetes. ?What should I know about preventing infection? ?Hepatitis B ?If you have a higher risk for hepatitis B, you should be screened for this virus. Talk with your health care provider to find out if you are at risk for hepatitis B infection. ?Hepatitis C ?Testing is recommended for: ?Everyone born from [birth year] through 1964. ?Anyone with known risk factors for hepatitis C. ?Sexually transmitted infections (STIs) ?Get  screened for STIs, including gonorrhea and chlamydia, if: ?You are sexually active and are younger than 70 years of age. ?You are older than 70 years of age and your health care provider tells you that you are at risk for this type of infection. ?Your sexual activity has changed since you were last screened, and you are at increased risk for chlamydia or gonorrhea. Ask your health care provider if you are at risk. ?Ask your health care provider about whether you are at high risk for HIV. Your health care provider may recommend a prescription medicine to help prevent HIV infection. If you choose to take medicine to prevent HIV, you should first get tested for HIV. You should then be tested every 3 months for as long as you are taking the medicine. ?Pregnancy ?If you are about to stop having your period (premenopausal) and you may become pregnant, seek counseling before you get pregnant. ?Take 400 to 800 micrograms (mcg) of folic acid every day if you become pregnant. ?Ask for birth control (contraception) if  you want to prevent pregnancy. ?Osteoporosis and menopause ?Osteoporosis is a disease in which the bones lose minerals and strength with aging. This can result in bone fractures. If you are 40 years old or older, or if you are at risk for osteoporosis and fractures, ask your health care provider if you should: ?Be screened for bone loss. ?Take a calcium or vitamin D supplement to lower your risk of fractures. ?Be given hormone replacement therapy (HRT) to treat symptoms of menopause. ?Follow these instructions at home: ?Alcohol use ?Do not drink alcohol if: ?Your health care provider tells you not to drink. ?You are pregnant, may be pregnant, or are planning to become pregnant. ?If you drink alcohol: ?Limit how much you have to: ?0-1 drink a day. ?Know how much alcohol is in your drink. In the [place] one drink equals one 12 oz bottle of beer (355 mL), one 5 oz glass of wine (148 mL), or one 1? oz glass of hard  liquor (44 mL). ?Lifestyle ?Do not use any products that contain nicotine or tobacco. These products include cigarettes, chewing tobacco, and vaping devices, such as e-cigarettes. If you need help quitting, ask

## 2021-11-27 ENCOUNTER — Other Ambulatory Visit: Payer: Medicare HMO

## 2021-12-11 ENCOUNTER — Other Ambulatory Visit: Payer: Medicare HMO

## 2021-12-24 ENCOUNTER — Ambulatory Visit (INDEPENDENT_AMBULATORY_CARE_PROVIDER_SITE_OTHER): Payer: Medicare HMO | Admitting: Physician Assistant

## 2021-12-24 ENCOUNTER — Encounter: Payer: Self-pay | Admitting: Physician Assistant

## 2021-12-24 VITALS — BP 136/65 | HR 78 | Ht 63.0 in | Wt 203.0 lb

## 2021-12-24 DIAGNOSIS — R944 Abnormal results of kidney function studies: Secondary | ICD-10-CM | POA: Diagnosis not present

## 2021-12-24 DIAGNOSIS — J301 Allergic rhinitis due to pollen: Secondary | ICD-10-CM | POA: Diagnosis not present

## 2021-12-24 DIAGNOSIS — J302 Other seasonal allergic rhinitis: Secondary | ICD-10-CM | POA: Diagnosis not present

## 2021-12-24 DIAGNOSIS — J432 Centrilobular emphysema: Secondary | ICD-10-CM

## 2021-12-24 MED ORDER — FEXOFENADINE HCL 180 MG PO TABS
180.0000 mg | ORAL_TABLET | Freq: Every day | ORAL | 3 refills | Status: AC
Start: 2021-12-24 — End: ?

## 2021-12-24 MED ORDER — METHYLPREDNISOLONE SODIUM SUCC 125 MG IJ SOLR
125.0000 mg | Freq: Once | INTRAMUSCULAR | Status: AC
  Administered 2021-12-24: 125 mg via INTRAMUSCULAR

## 2021-12-24 NOTE — Patient Instructions (Addendum)
Allegra in the morning and Singulair at bedtime.  Continue on trelegy daily.  Consider nasal saline washes  Allergies, Adult An allergy is a condition in which the body's defense system (immune system) comes in contact with an allergen and reacts to it. An allergen is anything that causes an allergic reaction. Allergens cause the immune system to make proteins for fighting infections (antibodies). These antibodies cause cells to release chemicals called histamines that set off the symptoms of an allergic reaction. Allergies often affect the nasal passages (allergic rhinitis), eyes (allergic conjunctivitis), skin (atopic dermatitis), and stomach. Allergies can be mild, moderate, or severe. They cannot spread from person to person. Allergies can develop at any age and may be outgrown. What are the causes? This condition is caused by allergens. Common allergens include: Outdoor allergens, such as pollen, car fumes, and mold. Indoor allergens, such as dust, smoke, mold, and pet dander. Other allergens, such as foods, medicines, scents, insect bites or stings, and other skin irritants. What increases the risk? You are more likely to develop this condition if you have: Family members with allergies. Family members who have any condition that may be caused by allergens, such as asthma. This may make you more likely to have other allergies. What are the signs or symptoms? Symptoms of this condition depend on the severity of the allergy. Mild to moderate symptoms Runny nose, stuffy nose (nasal congestion), or sneezing. Itchy mouth, ears, or throat. A feeling of mucus dripping down the back of your throat (postnasal drip). Sore throat. Itchy, red, watery, or puffy eyes. Skin rash, or itchy, red, swollen areas of skin (hives). Stomach cramps or bloating. Severe symptoms Severe allergies to food, medicine, or insect bites may cause anaphylaxis, which can be life-threatening. Symptoms include: A red  (flushed) face. Wheezing or coughing. Swollen lips, tongue, or mouth. Tight or swollen throat. Chest pain or tightness, or rapid heartbeat. Trouble breathing or shortness of breath. Pain in the abdomen, vomiting, or diarrhea. Dizziness or fainting. How is this diagnosed? This condition is diagnosed based on your symptoms, your family and medical history, and a physical exam. You may also have tests, including: Skin tests to see how your skin reacts to allergens that may be causing your symptoms. Tests include: Skin prick test. For this test, an allergen is introduced to your body through a small opening in the skin. Intradermal skin test. For this test, a small amount of allergen is injected under the first layer of your skin. Patch test. For this test, a small amount of allergen is placed on your skin. The area is covered and then checked after a few days. Blood tests. A challenge test. For this test, you will eat or breathe in a small amount of allergen to see if you have an allergic reaction. You may also be asked to: Keep a food diary. This is a record of all the foods, drinks, and symptoms you have in a day. Try an elimination diet. To do this: Remove certain foods from your diet. Add those foods back one by one to find out if any foods cause an allergic reaction. How is this treated?     Treatment for allergies depends on your symptoms. Treatment may include: Cold, wet cloths (cold compresses) to soothe itching and swelling. Eye drops or nasal sprays. Nasal irrigation to help clear your mucus or keep the nasal passages moist. A humidifier to add moisture to the air. Skin creams to treat rashes or itching. Oral antihistamines or  other medicines to block the reaction or to treat inflammation. Diet changes to remove foods that cause allergies. Being exposed again and again to tiny amounts of allergens to help you build a defense against it (tolerance). This is called  immunotherapy. Examples include: Allergy shot. You receive an injection that contains an allergen. Sublingual immunotherapy. You take a small dose of allergen under your tongue. Emergency injection for anaphylaxis. You give yourself a shot using a syringe (auto-injector) that contains the amount of medicine you need. Your health care provider will teach you how to give yourself an injection. Follow these instructions at home: Medicines  Take or apply over-the-counter and prescription medicines only as told by your health care provider. Always carry your auto-injector pen if you are at risk of anaphylaxis. Give yourself an injection as told by your health care provider. Eating and drinking Follow instructions from your health care provider about eating or drinking restrictions. Drink enough fluid to keep your urine pale yellow. General instructions Wear a medical alert bracelet or necklace to let others know that you have had anaphylaxis before. Avoid known allergens whenever possible. Keep all follow-up visits as told by your health care provider. This is important. Contact a health care provider if: Your symptoms do not get better with treatment. Get help right away if: You have symptoms of anaphylaxis. These include: Swollen mouth, tongue, or throat. Pain or tightness in your chest. Trouble breathing or shortness of breath. Dizziness or fainting. Severe abdominal pain, vomiting, or diarrhea. These symptoms may represent a serious problem that is an emergency. Do not wait to see if the symptoms will go away. Get medical help right away. Call your local emergency services (911 in the [place] Do not drive yourself to the hospital. Summary Take or apply over-the-counter and prescription medicines only as told by your health care provider. Avoid known allergens when possible. Always carry your auto-injector pen if you are at risk of anaphylaxis. Give yourself an injection as told by your  health care provider. Wear a medical alert bracelet or necklace to let others know that you have had anaphylaxis before. Anaphylaxis is a life-threatening emergency. Get help right away. This information is not intended to replace advice given to you by your health care provider. Make sure you discuss any questions you have with your health care provider. Document Revised: 03/05/2020 Document Reviewed: 05/18/2019 Elsevier Patient Education  [place].

## 2021-12-25 ENCOUNTER — Ambulatory Visit (INDEPENDENT_AMBULATORY_CARE_PROVIDER_SITE_OTHER): Payer: Medicare HMO

## 2021-12-25 DIAGNOSIS — Z780 Asymptomatic menopausal state: Secondary | ICD-10-CM

## 2021-12-25 DIAGNOSIS — Z0000 Encounter for general adult medical examination without abnormal findings: Secondary | ICD-10-CM

## 2021-12-25 DIAGNOSIS — M8588 Other specified disorders of bone density and structure, other site: Secondary | ICD-10-CM | POA: Diagnosis not present

## 2021-12-25 LAB — COMPLETE METABOLIC PANEL WITH GFR
AG Ratio: 1.8 (calc) (ref 1.0–2.5)
ALT: 8 U/L (ref 6–29)
AST: 12 U/L (ref 10–35)
Albumin: 4.2 g/dL (ref 3.6–5.1)
Alkaline phosphatase (APISO): 76 U/L (ref 37–153)
BUN/Creatinine Ratio: 21 (calc) (ref 6–22)
BUN: 23 mg/dL (ref 7–25)
CO2: 23 mmol/L (ref 20–32)
Calcium: 9.8 mg/dL (ref 8.6–10.4)
Chloride: 106 mmol/L (ref 98–110)
Creat: 1.09 mg/dL — ABNORMAL HIGH (ref 0.50–1.05)
Globulin: 2.4 g/dL (calc) (ref 1.9–3.7)
Glucose, Bld: 98 mg/dL (ref 65–99)
Potassium: 5.5 mmol/L — ABNORMAL HIGH (ref 3.5–5.3)
Sodium: 139 mmol/L (ref 135–146)
Total Bilirubin: 0.6 mg/dL (ref 0.2–1.2)
Total Protein: 6.6 g/dL (ref 6.1–8.1)
eGFR: 55 mL/min/{1.73_m2} — ABNORMAL LOW (ref >=60)

## 2021-12-25 NOTE — Progress Notes (Signed)
Established Patient Office Visit  Subjective   Patient ID: Ana King, female    DOB: 1951/01/07  Age: 71 y.o. MRN: [id number]  No chief complaint on file.   HPI Pt is a 71 yo obese female with COPD and seasonal allergies who presents to the clinic with worsening allergies.   She is on singulair. She is having itching eyes, sneezing, runny and congested nose for the past week. She is not taking anti-histamine. She is using trelegy daily. She denies any shortness or breath. She does have cough which is more dry than productive. She does use albuterol inhaler about once a day. No fever, chills, nausea, body aches.   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   Reactive airway disease 06/09/2017   Former smoker 06/09/2017   Chronic venous stasis 12/22/2017   Muscle cramp 12/22/2017   Edema of left ankle 12/22/2017   Osteopenia 03/17/2018   SOB (shortness of breath) on exertion 01/11/2019   Depressed mood 01/11/2019   Right shoulder pain 04/12/2019   Cervical spondylosis 05/10/2019   Non-restorative sleep 08/17/2019   Snoring 08/17/2019   Class 2 obesity due to excess calories without serious comorbidity with body mass index (BMI) of 36.0 to 36.9 in adult 08/17/2019   Hyperlipidemia 08/18/2019   OSA (obstructive sleep apnea) 09/20/2019   Diarrhea 11/08/2019   Plantar fasciitis, left 01/24/2020   Acute intractable headache 01/31/2020   Vision changes 01/31/2020   Non-intractable vomiting 01/31/2020   Eye pain, right 01/31/2020   Herpes zoster with ophthalmic complication 02/03/2020   Hyponatremia 02/20/2020   Hypocalcemia 02/20/2020   Low hemoglobin 02/20/2020   AKI (acute kidney injury) (HCC) 02/21/2020   CKD (chronic kidney disease) stage 3, GFR 30-59 ml/min  (HCC) 02/20/2021   Grade I diastolic dysfunction 02/26/2021   Coronary artery calcification seen on CT scan 04/17/2021   Aortic atherosclerosis (HCC) 04/17/2021   Centrilobular emphysema (HCC) 04/17/2021   Acute pain of left shoulder 06/03/2021   Bilateral leg edema 07/30/2021   Left ear impacted cerumen 07/30/2021   Vertigo 07/30/2021   Injury of right leg 10/14/2021   Seasonal allergic rhinitis due to pollen 12/24/2021   Seasonal allergies 12/24/2021   Decreased GFR 12/24/2021   Resolved Ambulatory Problems    Diagnosis Date Noted   Tobacco abuse 11/02/2013   COPD (chronic obstructive pulmonary disease) (HCC) 11/02/2013   Past Medical History:  Diagnosis Date   Asthma     ROS   See HPI.  Objective:     BP 136/65   Pulse 78   Ht 5\' 3"  (1.6 m)   Wt 203 lb (92.1 kg)   SpO2 99%   BMI 35.96 kg/m  BP Readings from Last 3 Encounters:  12/24/21 136/65  10/14/21 (!) 156/62  09/23/21 134/65   Wt Readings from Last 3 Encounters:  12/24/21 203 lb (92.1 kg)  10/14/21 203 lb (92.1 kg)  09/23/21 203 lb (92.1 kg)      Physical Exam Vitals reviewed.  Constitutional:      Appearance: Normal appearance. She is obese.  HENT:     Head: Normocephalic.     Right Ear: Tympanic membrane, ear canal and external ear normal. There is no impacted cerumen.     Left Ear: Tympanic membrane, ear canal and external ear  normal. There is no impacted cerumen.     Nose: Congestion present. No rhinorrhea.     Mouth/Throat:     Mouth: Mucous membranes are moist.     Pharynx: No oropharyngeal exudate or posterior oropharyngeal erythema.  Eyes:     Conjunctiva/sclera: Conjunctivae normal.  Cardiovascular:     Rate and Rhythm: Normal rate and regular rhythm.     Pulses: Normal pulses.     Heart sounds: Murmur heard.  Pulmonary:     Effort: Pulmonary effort is normal.     Breath sounds: Normal breath sounds.  Musculoskeletal:     Right lower leg: No edema.     Left lower leg: No edema.   Neurological:     General: No focal deficit present.     Mental Status: She is alert and oriented to person, place, and time.  Psychiatric:        Mood and Affect: Mood normal.        Assessment & Plan:  Marland KitchenMarland KitchenDiagnoses and all orders for this visit:  Seasonal allergies -     fexofenadine (ALLEGRA ALLERGY) 180 MG tablet; Take 1 tablet (180 mg total) by mouth daily. -     methylPREDNISolone sodium succinate (SOLU-MEDROL) 125 mg/2 mL injection 125 mg  Centrilobular emphysema (HCC) -     methylPREDNISolone sodium succinate (SOLU-MEDROL) 125 mg/2 mL injection 125 mg  Seasonal allergic rhinitis due to pollen -     fexofenadine (ALLEGRA ALLERGY) 180 MG tablet; Take 1 tablet (180 mg total) by mouth daily.  Decreased GFR -     COMPLETE METABOLIC PANEL WITH GFR   Need to get cmp to follow up on decreased kidney function Pt appears stable today Allergies worsening added allegra to singulair Solumedrol 125mg  IM given today Consider nasal saline rinses Consider flonase daily Follow up as needed or if symptoms persist.    Return in about 6 months (around 06/25/2022), or if symptoms worsen or fail to improve.    Iran Planas, PA-C

## 2021-12-25 NOTE — Progress Notes (Signed)
Kidney function improved from 4 months ago. Your kidneys do look dry. Drink more water. Potassium a little high. Don't take any potassium supplements. Take the HCTZ 12.5mg  tablet daily. Recheck in 1 month.

## 2021-12-26 NOTE — Progress Notes (Signed)
Osteopenic, low bone mass, continue to take vitamin D and calcium daily. Try to get 150 minutes of walking a week.

## 2022-01-22 ENCOUNTER — Other Ambulatory Visit: Payer: Self-pay

## 2022-01-22 DIAGNOSIS — E875 Hyperkalemia: Secondary | ICD-10-CM

## 2022-01-22 NOTE — Progress Notes (Signed)
Ordered labs per results.  

## 2022-01-24 ENCOUNTER — Other Ambulatory Visit: Payer: Medicare HMO

## 2022-01-24 DIAGNOSIS — E875 Hyperkalemia: Secondary | ICD-10-CM | POA: Diagnosis not present

## 2022-01-25 LAB — COMPLETE METABOLIC PANEL WITH GFR
AG Ratio: 1.9 (calc) (ref 1.0–2.5)
ALT: 7 U/L (ref 6–29)
AST: 10 U/L (ref 10–35)
Albumin: 4.1 g/dL (ref 3.6–5.1)
Alkaline phosphatase (APISO): 79 U/L (ref 37–153)
BUN/Creatinine Ratio: 21 (calc) (ref 6–22)
BUN: 23 mg/dL (ref 7–25)
CO2: 25 mmol/L (ref 20–32)
Calcium: 9.8 mg/dL (ref 8.6–10.4)
Chloride: 110 mmol/L (ref 98–110)
Creat: 1.08 mg/dL — ABNORMAL HIGH (ref 0.60–1.00)
Globulin: 2.2 g/dL (calc) (ref 1.9–3.7)
Glucose, Bld: 85 mg/dL (ref 65–99)
Potassium: 5.2 mmol/L (ref 3.5–5.3)
Sodium: 142 mmol/L (ref 135–146)
Total Bilirubin: 0.5 mg/dL (ref 0.2–1.2)
Total Protein: 6.3 g/dL (ref 6.1–8.1)
eGFR: 55 mL/min/{1.73_m2} — ABNORMAL LOW (ref >=60)

## 2022-01-27 NOTE — Progress Notes (Signed)
Stable kidney function.  Potassium normal range.

## 2022-02-19 ENCOUNTER — Other Ambulatory Visit: Payer: Self-pay | Admitting: Physician Assistant

## 2022-02-24 ENCOUNTER — Ambulatory Visit: Payer: Medicare HMO | Admitting: Physician Assistant

## 2022-03-22 ENCOUNTER — Other Ambulatory Visit: Payer: Self-pay | Admitting: Physician Assistant

## 2022-03-22 MED ORDER — RIZATRIPTAN BENZOATE 10 MG PO TBDP: 10.0000 mg | ORAL_TABLET | ORAL | 0 refills | Status: AC | PRN

## 2022-03-22 MED ORDER — NIRMATRELVIR/RITONAVIR (PAXLOVID) TABLET (RENAL DOSING): 2.0000 | ORAL_TABLET | Freq: Two times a day (BID) | ORAL | 0 refills | Status: AC

## 2022-03-22 MED ORDER — NURTEC 75 MG PO TBDP: 1.0000 | ORAL_TABLET | ORAL | 1 refills | Status: DC | PRN

## 2022-03-22 NOTE — Progress Notes (Signed)
Positive home covid test.  Sent paxlovid.  Pt c/o worsening migraines Sent nurtec and maxalt as needed for migraine rescue.

## 2022-03-26 ENCOUNTER — Ambulatory Visit (INDEPENDENT_AMBULATORY_CARE_PROVIDER_SITE_OTHER): Payer: Medicare HMO | Admitting: Physician Assistant

## 2022-03-26 ENCOUNTER — Encounter: Payer: Self-pay | Admitting: Physician Assistant

## 2022-03-26 VITALS — BP 129/54 | HR 75 | Ht 63.0 in | Wt 197.0 lb

## 2022-03-26 DIAGNOSIS — I10 Essential (primary) hypertension: Secondary | ICD-10-CM | POA: Diagnosis not present

## 2022-03-26 DIAGNOSIS — E782 Mixed hyperlipidemia: Secondary | ICD-10-CM | POA: Diagnosis not present

## 2022-03-26 DIAGNOSIS — G43009 Migraine without aura, not intractable, without status migrainosus: Secondary | ICD-10-CM | POA: Diagnosis not present

## 2022-03-26 DIAGNOSIS — U071 COVID-19: Secondary | ICD-10-CM | POA: Diagnosis not present

## 2022-03-26 DIAGNOSIS — D649 Anemia, unspecified: Secondary | ICD-10-CM

## 2022-03-26 DIAGNOSIS — Z79899 Other long term (current) drug therapy: Secondary | ICD-10-CM | POA: Diagnosis not present

## 2022-03-26 DIAGNOSIS — Z1329 Encounter for screening for other suspected endocrine disorder: Secondary | ICD-10-CM | POA: Diagnosis not present

## 2022-03-26 DIAGNOSIS — I700 Atherosclerosis of aorta: Secondary | ICD-10-CM | POA: Diagnosis not present

## 2022-03-26 DIAGNOSIS — J432 Centrilobular emphysema: Secondary | ICD-10-CM

## 2022-03-26 DIAGNOSIS — J309 Allergic rhinitis, unspecified: Secondary | ICD-10-CM

## 2022-03-26 MED ORDER — AZELASTINE HCL 0.1 % NA SOLN: 2.0000 | Freq: Two times a day (BID) | NASAL | 1 refills | Status: AC

## 2022-03-26 NOTE — Progress Notes (Signed)
Established Patient Office Visit  Subjective   Patient ID: Ana King, female    DOB: 1950-11-04  Age: 71 y.o. MRN: [id number]  Chief Complaint  Patient presents with   Follow-up    HPI Pt is a 71 yo obese female on Day 7 of covid infection here for follow up and medication refills.   Overall she is doing well. She feels weak but no major concerns. She is taking paxlovid.   Pt has allergies and COPD but no current exacerbation. Adding allegra to singulair has helped a lot of her symptoms. She does do nasal saline washes. She uses azastline but needs refills. On trelegy.  Not checking BP at home. Denies any CP, palpitations, vision changes. She continues to get migraines. Insurance would not pay for nurtec.    .. 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   Reactive airway disease 06/09/2017   Former smoker 06/09/2017   Chronic venous stasis 12/22/2017   Muscle cramp 12/22/2017   Edema of left ankle 12/22/2017   Osteopenia 03/17/2018   SOB (shortness of breath) on exertion 01/11/2019   Depressed mood 01/11/2019   Right shoulder pain 04/12/2019   Cervical spondylosis 05/10/2019   Non-restorative sleep 08/17/2019   Snoring 08/17/2019   Class 2 obesity due to excess calories without serious comorbidity with body mass index (BMI) of 36.0 to 36.9 in adult 08/17/2019   Hyperlipidemia 08/18/2019   OSA (obstructive sleep apnea) 09/20/2019   Diarrhea 11/08/2019   Plantar fasciitis, left 01/24/2020   Acute intractable headache 01/31/2020   Vision changes 01/31/2020   Non-intractable vomiting 01/31/2020   Eye pain, right 01/31/2020   Herpes zoster with ophthalmic complication 02/03/2020   Hyponatremia 02/20/2020   Hypocalcemia 02/20/2020   Low hemoglobin  02/20/2020   AKI (acute kidney injury) (HCC) 02/21/2020   CKD (chronic kidney disease) stage 3, GFR 30-59 ml/min (HCC) 02/20/2021   Grade I diastolic dysfunction 02/26/2021   Coronary artery calcification seen on CT scan 04/17/2021   Aortic atherosclerosis (HCC) 04/17/2021   Centrilobular emphysema (HCC) 04/17/2021   Acute pain of left shoulder 06/03/2021   Bilateral leg edema 07/30/2021   Left ear impacted cerumen 07/30/2021   Vertigo 07/30/2021   Injury of right leg 10/14/2021   Seasonal allergic rhinitis due to pollen 12/24/2021   Seasonal allergies 12/24/2021   Decreased GFR 12/24/2021   Migraine without aura and without status migrainosus, not intractable 03/28/2022   Resolved Ambulatory Problems    Diagnosis Date Noted   Tobacco abuse 11/02/2013   COPD (chronic obstructive pulmonary disease) (HCC) 11/02/2013   Past Medical History:  Diagnosis Date   Asthma      ROS See HPI.    Objective:     BP (!) 129/54   Pulse 75   Ht 5\' 3"  (1.6 m)   Wt 197 lb (89.4 kg)   SpO2 97%   BMI 34.90 kg/m  BP Readings from Last 3 Encounters:  03/26/22 (!) 129/54  12/24/21 136/65  10/14/21 (!) 156/62   Wt Readings from Last 3 Encounters:  03/26/22 197 lb (89.4 kg)  12/24/21 203 lb (92.1 kg)  10/14/21 203 lb (92.1 kg)      Physical Exam Constitutional:      Appearance: Normal appearance. She is obese.  HENT:  Head: Normocephalic.     Right Ear: Tympanic membrane normal.     Left Ear: Tympanic membrane normal.     Nose: Nose normal.     Mouth/Throat:     Mouth: Mucous membranes are moist.  Eyes:     Conjunctiva/sclera: Conjunctivae normal.  Neck:     Vascular: No carotid bruit.  Cardiovascular:     Rate and Rhythm: Normal rate and regular rhythm.     Pulses: Normal pulses.  Pulmonary:     Effort: Pulmonary effort is normal.     Breath sounds: Normal breath sounds.  Musculoskeletal:     Cervical back: No rigidity or tenderness.     Right lower leg: No edema.      Left lower leg: No edema.  Lymphadenopathy:     Cervical: No cervical adenopathy.  Neurological:     General: No focal deficit present.     Mental Status: She is alert and oriented to person, place, and time.  Psychiatric:        Mood and Affect: Mood normal.         Assessment & Plan:  Marland KitchenMarland KitchenChrystle was seen today for follow-up.  Diagnoses and all orders for this visit:  COVID-19 virus infection  Essential hypertension, benign -     COMPLETE METABOLIC PANEL WITH GFR  Mixed hyperlipidemia -     Lipid Panel w/reflex Direct LDL -     atorvastatin (LIPITOR) 40 MG tablet; Take 1 tablet (40 mg total) by mouth daily.  Low hemoglobin -     CBC with Differential/Platelet  Thyroid disorder screen -     TSH  Medication management -     TSH -     Lipid Panel w/reflex Direct LDL -     COMPLETE METABOLIC PANEL WITH GFR -     CBC with Differential/Platelet  Aortic atherosclerosis (HCC) -     atorvastatin (LIPITOR) 40 MG tablet; Take 1 tablet (40 mg total) by mouth daily.  Centrilobular emphysema (HCC)  Migraine without aura and without status migrainosus, not intractable -     Rimegepant Sulfate (NURTEC) 75 MG TBDP; Take 1 tablet by mouth every other day.  Chronic allergic rhinitis -     azelastine (ASTELIN) 0.1 % nasal spray; Place 2 sprays into both nostrils 2 (two) times daily. Use in each nostril as directed   Overall patient is doing well.  Lungs sound clear today Will check screening labs Needs high dose flu shot BP to goal-refills as needed Lipid to recheck Will send nurtec to speciality pharmacy to see if can get covered Continue singulair/allegra/astelin/trelegy. Follow up with any new or worsening symptoms.    Tandy Gaw, PA-C

## 2022-03-27 LAB — COMPLETE METABOLIC PANEL WITH GFR
AG Ratio: 1.8 (calc) (ref 1.0–2.5)
ALT: 10 U/L (ref 6–29)
AST: 14 U/L (ref 10–35)
Albumin: 4.2 g/dL (ref 3.6–5.1)
Alkaline phosphatase (APISO): 73 U/L (ref 37–153)
BUN/Creatinine Ratio: 17 (calc) (ref 6–22)
BUN: 21 mg/dL (ref 7–25)
CO2: 22 mmol/L (ref 20–32)
Calcium: 9.6 mg/dL (ref 8.6–10.4)
Chloride: 96 mmol/L — ABNORMAL LOW (ref 98–110)
Creat: 1.25 mg/dL — ABNORMAL HIGH (ref 0.60–1.00)
Globulin: 2.3 g/dL (calc) (ref 1.9–3.7)
Glucose, Bld: 98 mg/dL (ref 65–99)
Potassium: 4.8 mmol/L (ref 3.5–5.3)
Sodium: 130 mmol/L — ABNORMAL LOW (ref 135–146)
Total Bilirubin: 0.6 mg/dL (ref 0.2–1.2)
Total Protein: 6.5 g/dL (ref 6.1–8.1)
eGFR: 46 mL/min/{1.73_m2} — ABNORMAL LOW (ref >=60)

## 2022-03-27 LAB — CBC WITH DIFFERENTIAL/PLATELET
Absolute Monocytes: 593 cells/uL (ref 200–950)
Basophils Absolute: 8 cells/uL (ref 0–200)
Basophils Relative: 0.1 %
Eosinophils Absolute: 140 cells/uL (ref 15–500)
Eosinophils Relative: 1.8 %
HCT: 33 % — ABNORMAL LOW (ref 35.0–45.0)
Hemoglobin: 11.1 g/dL — ABNORMAL LOW (ref 11.7–15.5)
Lymphs Abs: 1872 cells/uL (ref 850–3900)
MCH: 30.2 pg (ref 27.0–33.0)
MCHC: 33.6 g/dL (ref 32.0–36.0)
MCV: 89.9 fL (ref 80.0–100.0)
MPV: 10.3 fL (ref 7.5–12.5)
Monocytes Relative: 7.6 %
Neutro Abs: 5187 cells/uL (ref 1500–7800)
Neutrophils Relative %: 66.5 %
Platelets: 264 10*3/uL (ref 140–400)
RBC: 3.67 10*6/uL — ABNORMAL LOW (ref 3.80–5.10)
RDW: 12.4 % (ref 11.0–15.0)
Total Lymphocyte: 24 %
WBC: 7.8 10*3/uL (ref 3.8–10.8)

## 2022-03-27 LAB — LIPID PANEL W/REFLEX DIRECT LDL
Cholesterol: 127 mg/dL (ref <200)
HDL: 51 mg/dL (ref >=50)
LDL Cholesterol (Calc): 54 mg/dL (calc)
Non-HDL Cholesterol (Calc): 76 mg/dL (calc) (ref <130)
Total CHOL/HDL Ratio: 2.5 (calc) (ref <5.0)
Triglycerides: 132 mg/dL (ref <150)

## 2022-03-27 LAB — TSH: TSH: 1.35 mIU/L (ref 0.40–4.50)

## 2022-03-28 ENCOUNTER — Other Ambulatory Visit: Payer: Self-pay | Admitting: Neurology

## 2022-03-28 DIAGNOSIS — N289 Disorder of kidney and ureter, unspecified: Secondary | ICD-10-CM

## 2022-03-28 DIAGNOSIS — G43009 Migraine without aura, not intractable, without status migrainosus: Secondary | ICD-10-CM | POA: Insufficient documentation

## 2022-03-28 DIAGNOSIS — J309 Allergic rhinitis, unspecified: Secondary | ICD-10-CM

## 2022-03-28 MED ORDER — NURTEC 75 MG PO TBDP: 1.0000 | ORAL_TABLET | ORAL | 5 refills | Status: DC

## 2022-03-28 MED ORDER — ATORVASTATIN CALCIUM 40 MG PO TABS: 40.0000 mg | ORAL_TABLET | Freq: Every day | ORAL | 3 refills | Status: DC

## 2022-03-28 NOTE — Progress Notes (Signed)
Mechelle,   Sodium is low and kidney function decreased a bit but likely due to recent illness with covid. Increase salt for next week and stay hydrated. It will likely adjust on its own. Recheck in 1 week.  Cholesterol looks great.  Thyroid looks great.  Hemoglobin a little low. Make sure eating iron rich foods and good to take iron supplement with breakfast.

## 2022-04-03 ENCOUNTER — Other Ambulatory Visit: Payer: Self-pay | Admitting: Physician Assistant

## 2022-04-03 DIAGNOSIS — J4521 Mild intermittent asthma with (acute) exacerbation: Secondary | ICD-10-CM

## 2022-04-08 DIAGNOSIS — N289 Disorder of kidney and ureter, unspecified: Secondary | ICD-10-CM | POA: Diagnosis not present

## 2022-04-09 LAB — COMPLETE METABOLIC PANEL WITH GFR
AG Ratio: 1.6 (calc) (ref 1.0–2.5)
ALT: 9 U/L (ref 6–29)
AST: 10 U/L (ref 10–35)
Albumin: 4 g/dL (ref 3.6–5.1)
Alkaline phosphatase (APISO): 77 U/L (ref 37–153)
BUN/Creatinine Ratio: 22 (calc) (ref 6–22)
BUN: 23 mg/dL (ref 7–25)
CO2: 22 mmol/L (ref 20–32)
Calcium: 10 mg/dL (ref 8.6–10.4)
Chloride: 107 mmol/L (ref 98–110)
Creat: 1.05 mg/dL — ABNORMAL HIGH (ref 0.60–1.00)
Globulin: 2.5 g/dL (calc) (ref 1.9–3.7)
Glucose, Bld: 87 mg/dL (ref 65–99)
Potassium: 5 mmol/L (ref 3.5–5.3)
Sodium: 139 mmol/L (ref 135–146)
Total Bilirubin: 0.5 mg/dL (ref 0.2–1.2)
Total Protein: 6.5 g/dL (ref 6.1–8.1)
eGFR: 57 mL/min/{1.73_m2} — ABNORMAL LOW (ref >=60)

## 2022-04-09 NOTE — Progress Notes (Signed)
Kidney function is slowly coming back up to baseline. Sodium and potassium look good.

## 2022-04-10 ENCOUNTER — Ambulatory Visit (INDEPENDENT_AMBULATORY_CARE_PROVIDER_SITE_OTHER): Payer: Medicare HMO | Admitting: Pharmacist

## 2022-04-10 DIAGNOSIS — E782 Mixed hyperlipidemia: Secondary | ICD-10-CM

## 2022-04-10 DIAGNOSIS — J432 Centrilobular emphysema: Secondary | ICD-10-CM

## 2022-04-10 DIAGNOSIS — I10 Essential (primary) hypertension: Secondary | ICD-10-CM

## 2022-04-10 NOTE — Patient Instructions (Signed)
Sandria Manly chatting with you today! Keep doing a great job with medications. Let me know if you need anything.  Best wishes, Luana Shu, PharmD Clinical Pharmacist [facility] [phone]

## 2022-04-10 NOTE — Progress Notes (Signed)
Chronic Care Management Pharmacy Note  04/10/2022 Name:  Ana King MRN:  [id number] DOB:  07/28/1950  Summary: HTN, HLD, COPD  Recommendations/Changes made from today's visit: none  Plan: f/u with pharmacist in 1 yr  Subjective: Ana King is an 71 y.o. year old female who is a primary patient of Ana Stade, PA-C.  The CCM team was consulted for assistance with disease management and care coordination needs.    Engaged with patient by telephone for initial visit in response to provider referral for pharmacy case management and/or care coordination services.   Consent to Services:  The patient was given information about Chronic Care Management services, agreed to services, and gave verbal consent prior to initiation of services.  Please see initial visit note for detailed documentation.   Patient Care Team: Lavada Mesi as PCP - General (Family Medicine) Darius Bump, [facility] as Pharmacist (Pharmacist)  Recent office visits:  02/19/21 - Ana Stade PA - Seen for SOB - labs ordered - Referral for lung cancer screening -    Recent consult visits:  02/25/21 Ana Stade PA - Echocardiogram - Notes not available - Start  ondansetron  8 MG tablet; Take 1 tablet (8 mg total) by mouth every 8 (eight) hours as needed for nausea or vomiting.- Follow up in 6 months  12/23/20 [facility] - Exposure to covid 19 - Results are Negative      Hospital visits:  None in previous 6 months  Objective:  Lab Results  Component Value Date   CREATININE 1.05 (H) 04/08/2022   CREATININE 1.25 (H) 03/26/2022   CREATININE 1.08 (H) 01/24/2022    Lab Results  Component Value Date   HGBA1C 5.6 11/08/2019   Last diabetic Eye exam: No results found for: "HMDIABEYEEXA"  Last diabetic Foot exam: No results found for: "HMDIABFOOTEX"      Component Value Date/Time   CHOL 127 03/26/2022 0000   TRIG 132 03/26/2022 0000   HDL 51 03/26/2022 0000   CHOLHDL 2.5 03/26/2022  0000   LDLCALC 54 03/26/2022 0000       Latest Ref Rng & Units 04/08/2022   12:00 AM 03/26/2022   12:00 AM 01/24/2022   12:00 AM  Hepatic Function  Total Protein 6.1 - 8.1 g/dL 6.5  6.5  6.3   AST 10 - 35 U/L 10  14  10    ALT 6 - 29 U/L 9  10  7    Total Bilirubin 0.2 - 1.2 mg/dL 0.5  0.6  0.5     Lab Results  Component Value Date/Time   TSH 1.35 03/26/2022 12:00 AM   TSH 1.77 02/19/2021 12:00 AM       Latest Ref Rng & Units 03/26/2022   12:00 AM 02/19/2021   12:00 AM 03/27/2020    3:21 PM  CBC  WBC 3.8 - 10.8 Thousand/uL 7.8  8.6  9.7   Hemoglobin 11.7 - 15.5 g/dL 11.1  11.9  10.5   Hematocrit 35.0 - 45.0 % 33.0  36.4  32.0   Platelets 140 - 400 Thousand/uL 264  301  325     No results found for: "VD25OH"  Clinical ASCVD:  The ASCVD Risk score (Arnett DK, et al., 2019) failed to calculate for the following reasons:   The valid total cholesterol range is 130 to 320 mg/dL     Social History   Tobacco Use  Smoking Status Former   Types: Cigarettes   Quit date: 05/21/2014  Years since quitting: 7.8  Smokeless Tobacco Never   BP Readings from Last 3 Encounters:  03/26/22 (!) 129/54  12/24/21 136/65  10/14/21 (!) 156/62   Pulse Readings from Last 3 Encounters:  03/26/22 75  12/24/21 78  10/14/21 63   Wt Readings from Last 3 Encounters:  03/26/22 197 lb (89.4 kg)  12/24/21 203 lb (92.1 kg)  10/14/21 203 lb (92.1 kg)    Assessment: Review of patient past medical history, allergies, medications, health status, including review of consultants reports, laboratory and other test data, was performed as part of comprehensive evaluation and provision of chronic care management services.   SDOH:  (Social Determinants of Health) assessments and interventions performed:  SDOH Interventions    Flowsheet Row Office Visit from 08/27/2020 in [place] Office Visit from 01/11/2019 in [place] Interventions Intervention Not Indicated --  Housing Interventions Intervention Not Indicated --  Transportation Interventions Intervention Not Indicated --  Depression Interventions/Treatment  -- Counseling  Financial Strain Interventions Intervention Not Indicated --  Physical Activity Interventions Intervention Not Indicated --  Stress Interventions Intervention Not Indicated --  Social Connections Interventions Intervention Not Indicated --       CCM Care Plan  Allergies  Allergen Reactions   Penicillins Hives    Medications Reviewed Today     Reviewed by Lavada Mesi (Physician Assistant) on 03/26/22 at 1412  Med List Status: <None>   Medication Order Taking? Sig Documenting Provider Last Dose Status Informant  albuterol (PROVENTIL) (2.5 MG/3ML) 0.083% nebulizer solution [id number] Yes Take 3 mLs (2.5 mg total) by nebulization every 6 (six) hours as needed for wheezing or shortness of breath. Ana Stade, PA-C Taking Active   albuterol (VENTOLIN HFA) 108 (90 Base) MCG/ACT inhaler [id number] Yes Inhale 2 puffs into the lungs every 6 (six) hours as needed for wheezing or shortness of breath. Ana Stade, PA-C Taking Active   atorvastatin (LIPITOR) 40 MG tablet [id number] Yes Take 1 tablet (40 mg total) by mouth daily. Ana Stade, PA-C Taking Active   Calcium Carbonate (CALCIUM 600 PO) [id number] Yes Take 1 tablet by mouth in the morning and at bedtime. [provider] Taking Active   cholecalciferol (VITAMIN D3) 25 MCG (1000 UNIT) tablet [id number] Yes Take 1,000 Units by mouth daily. [provider] Taking Active   fexofenadine [facility] ALLERGY) 180 MG tablet [id number] Yes Take 1 tablet (180 mg total) by mouth daily. Ana Stade, PA-C Taking Active   Fluticasone-Umeclidin-Vilant (TRELEGY ELLIPTA) 100-62.5-25 MCG/ACT AEPB [id number] Yes Inhale 1 puff into the lungs daily. Ana Stade, PA-C Taking Active    hydrochlorothiazide (HYDRODIURIL) 12.5 MG tablet [id number] Yes Take 1 tablet (12.5 mg total) by mouth daily. As needed for lower leg edema. Breeback, Jade L, PA-C Taking Active   ipratropium-albuterol (DUONEB) 0.5-2.5 (3) MG/3ML SOLN [id number] Yes Take 3 mLs by nebulization every 2 (two) hours as needed (wheeze, SOB). Ana Stade, PA-C Taking Active   lansoprazole (PREVACID) 30 MG capsule [id number] Yes TAKE 1 CAPSULE EVERY DAY AT [address], Jade L, PA-C Taking Active   lisinopril (ZESTRIL) 20 MG tablet [id number] Yes Take 1 tablet (20 mg total) by mouth daily. Ana Stade, PA-C Taking Active   meclizine (ANTIVERT) 25 MG tablet [id number] Yes Take 1 tablet (25 mg total) by mouth 3 (three) times daily as needed for dizziness. Iran Planas  L, PA-C Taking Active   meloxicam (MOBIC) 15 MG tablet [id number] Yes Take 1 tablet by mouth once daily Breeback, Jade L, PA-C Taking Active   montelukast (SINGULAIR) 10 MG tablet [id number] Yes TAKE 1 TABLET AT BEDTIME Breeback, Jade L, PA-C Taking Active   nirmatrelvir/ritonavir EUA, renal dosing, (PAXLOVID) 10 x 150 MG & 10 x 100MG  TABS [id number] Yes Take 2 tablets by mouth 2 (two) times daily for 5 days. (Take nirmatrelvir 150 mg one tablet twice daily for 5 days and ritonavir 100 mg one tablet twice daily for 5 days) Patient GFR is 55. Ana Stade, PA-C Taking Active   ondansetron (ZOFRAN) 8 MG tablet [id number] Yes Take 1 tablet (8 mg total) by mouth every 8 (eight) hours as needed for nausea or vomiting. Ana Stade, PA-C Taking Active   Rimegepant Sulfate (NURTEC) 75 MG TBDP [id number] Yes Take 1 tablet by mouth as needed. Ana Stade, PA-C Taking Active   rizatriptan (MAXALT-MLT) 10 MG disintegrating tablet [id number] Yes Take 1 tablet (10 mg total) by mouth as needed for migraine. May repeat in 2 hours if needed Ana Stade, PA-C Taking Active   vitamin C (ASCORBIC ACID) 500 MG tablet [id number] Yes Take 500 mg by mouth daily.  [provider] Taking Active             Patient Active Problem List   Diagnosis Date Noted   Migraine without aura and without status migrainosus, not intractable 03/28/2022   Seasonal allergic rhinitis due to pollen 12/24/2021   Seasonal allergies 12/24/2021   Decreased GFR 12/24/2021   Injury of right leg 10/14/2021   Bilateral leg edema 07/30/2021   Left ear impacted cerumen 07/30/2021   Vertigo 07/30/2021   Acute pain of left shoulder 06/03/2021   Coronary artery calcification seen on CT scan 04/17/2021   Aortic atherosclerosis ([place]) 04/17/2021   Centrilobular emphysema ([place]) 04/17/2021   Grade I diastolic dysfunction 32/20/2542   CKD (chronic kidney disease) stage 3, GFR 30-59 ml/min ([place]) 02/20/2021   AKI (acute kidney injury) ([place]) 02/21/2020   Hyponatremia 02/20/2020   Hypocalcemia 02/20/2020   Low hemoglobin 02/20/2020   Herpes zoster with ophthalmic complication 70/62/3762   Acute intractable headache 01/31/2020   Vision changes 01/31/2020   Non-intractable vomiting 01/31/2020   Eye pain, right 01/31/2020   Plantar fasciitis, left 01/24/2020   Diarrhea 11/08/2019   OSA (obstructive sleep apnea) 09/20/2019   Hyperlipidemia 08/18/2019   Non-restorative sleep 08/17/2019   Snoring 08/17/2019   Class 2 obesity due to excess calories without serious comorbidity with body mass index (BMI) of 36.0 to 36.9 in adult 08/17/2019   Cervical spondylosis 05/10/2019   Right shoulder pain 04/12/2019   SOB (shortness of breath) on exertion 01/11/2019   Depressed mood 01/11/2019   Osteopenia 03/17/2018   Chronic venous stasis 12/22/2017   Muscle cramp 12/22/2017   Edema of left ankle 12/22/2017   Reactive airway disease 06/09/2017   Former smoker 06/09/2017   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   Contusion 11/13/2015   Essential hypertension, benign 08/28/2015   Hiatal hernia 12/09/2013   Anxiety state 11/02/2013   GERD (gastroesophageal reflux disease) 11/02/2013     Immunization History  Administered Date(s) Administered   Fluad Quad(high Dose 65+) 03/18/2019, 05/01/2020, 04/15/2021   Influenza,inj,Quad PF,6+ Mos 05/24/2014, 05/07/2015, 05/13/2016, 03/24/2017, 05/03/2018   PFIZER(Purple Top)SARS-COV-2 Vaccination 03/07/2020, 04/28/2020   Pneumococcal Conjugate-13 03/24/2017   Pneumococcal Polysaccharide-23 05/03/2018   Tdap 03/02/2018   Zoster Recombinat (Shingrix) 10/20/2019, 05/01/2020  Conditions to be addressed/monitored: HTN, HLD, and COPD  There are no care plans that you recently modified to display for this patient.   Medication Assistance: None required.  Patient affirms current coverage meets needs.  Patient's preferred pharmacy is:  [facility] [place], [place] [address] Phone: [phone] Fax: [phone]  [place], [place] - [address] [place] [address] Phone: [phone] Fax: [phone]  [facility] [phone] - [place], [place] - [place] [address] [place] [id number] [place] [address] Phone: [phone] Fax: [phone]  [facility] (New Address) - [place], [place] AT Previously: [place] [address] Phone: [phone] Fax: [phone]  Uses pill box? Yes Pt endorses 100% compliance  Follow Up:  Patient agrees to Care Plan and Follow-up.  Plan: Telephone follow up appointment with care management team member scheduled for:  1 year  Darius Bump

## 2022-04-10 NOTE — Progress Notes (Signed)
Chronic Care Management Pharmacy Note  04/10/2022 Name:  Ana King MRN:  [id number] DOB:  1950/10/07  Summary: HTN, HLD, COPD.  Haven't had any recent migraines.  Recommendations/Changes made from today's visit: none  Plan: f/u with pharmacist in 1 yr  Subjective: Ana King is an 71 y.o. year old female who is a primary patient of Donella Stade, PA-C.  The CCM team was consulted for assistance with disease management and care coordination needs.    Engaged with patient by telephone for follow up visit in response to provider referral for pharmacy case management and/or care coordination services.   Consent to Services:  The patient was given information about Chronic Care Management services, agreed to services, and gave verbal consent prior to initiation of services.  Please see initial visit note for detailed documentation.   Patient Care Team: Lavada Mesi as PCP - General (Family Medicine) Darius Bump, [facility] as Pharmacist (Pharmacist)  Recent office visits:  02/19/21 - Donella Stade PA - Seen for SOB - labs ordered - Referral for lung cancer screening -    Recent consult visits:  02/25/21 Donella Stade PA - Echocardiogram - Notes not available - Start  ondansetron  8 MG tablet; Take 1 tablet (8 mg total) by mouth every 8 (eight) hours as needed for nausea or vomiting.- Follow up in 6 months  12/23/20 [facility] - Exposure to covid 19 - Results are Negative      Hospital visits:  None in previous 6 months  Objective:  Lab Results  Component Value Date   CREATININE 1.05 (H) 04/08/2022   CREATININE 1.25 (H) 03/26/2022   CREATININE 1.08 (H) 01/24/2022    Lab Results  Component Value Date   HGBA1C 5.6 11/08/2019   Last diabetic Eye exam: No results found for: "HMDIABEYEEXA"  Last diabetic Foot exam: No results found for: "HMDIABFOOTEX"      Component Value Date/Time   CHOL 127 03/26/2022 0000   TRIG 132 03/26/2022 0000   HDL 51  03/26/2022 0000   CHOLHDL 2.5 03/26/2022 0000   LDLCALC 54 03/26/2022 0000       Latest Ref Rng & Units 04/08/2022   12:00 AM 03/26/2022   12:00 AM 01/24/2022   12:00 AM  Hepatic Function  Total Protein 6.1 - 8.1 g/dL 6.5  6.5  6.3   AST 10 - 35 U/L 10  14  10    ALT 6 - 29 U/L 9  10  7    Total Bilirubin 0.2 - 1.2 mg/dL 0.5  0.6  0.5     Lab Results  Component Value Date/Time   TSH 1.35 03/26/2022 12:00 AM   TSH 1.77 02/19/2021 12:00 AM       Latest Ref Rng & Units 03/26/2022   12:00 AM 02/19/2021   12:00 AM 03/27/2020    3:21 PM  CBC  WBC 3.8 - 10.8 Thousand/uL 7.8  8.6  9.7   Hemoglobin 11.7 - 15.5 g/dL 11.1  11.9  10.5   Hematocrit 35.0 - 45.0 % 33.0  36.4  32.0   Platelets 140 - 400 Thousand/uL 264  301  325     No results found for: "VD25OH"  Clinical ASCVD:  The ASCVD Risk score (Arnett DK, et al., 2019) failed to calculate for the following reasons:   The valid total cholesterol range is 130 to 320 mg/dL     Social History   Tobacco Use  Smoking Status Former  Types: Cigarettes   Quit date: 05/21/2014   Years since quitting: 7.8  Smokeless Tobacco Never   BP Readings from Last 3 Encounters:  03/26/22 (!) 129/54  12/24/21 136/65  10/14/21 (!) 156/62   Pulse Readings from Last 3 Encounters:  03/26/22 75  12/24/21 78  10/14/21 63   Wt Readings from Last 3 Encounters:  03/26/22 197 lb (89.4 kg)  12/24/21 203 lb (92.1 kg)  10/14/21 203 lb (92.1 kg)    Assessment: Review of patient past medical history, allergies, medications, health status, including review of consultants reports, laboratory and other test data, was performed as part of comprehensive evaluation and provision of chronic care management services.   SDOH:  (Social Determinants of Health) assessments and interventions performed:  SDOH Interventions    Flowsheet Row Office Visit from 08/27/2020 in [place] Office Visit from 01/11/2019 in [place] Interventions Intervention Not Indicated --  Housing Interventions Intervention Not Indicated --  Transportation Interventions Intervention Not Indicated --  Depression Interventions/Treatment  -- Counseling  Financial Strain Interventions Intervention Not Indicated --  Physical Activity Interventions Intervention Not Indicated --  Stress Interventions Intervention Not Indicated --  Social Connections Interventions Intervention Not Indicated --       CCM Care Plan  Allergies  Allergen Reactions   Penicillins Hives    Medications Reviewed Today     Reviewed by Lavada Mesi (Physician Assistant) on 03/26/22 at 1412  Med List Status: <None>   Medication Order Taking? Sig Documenting Provider Last Dose Status Informant  albuterol (PROVENTIL) (2.5 MG/3ML) 0.083% nebulizer solution [id number] Yes Take 3 mLs (2.5 mg total) by nebulization every 6 (six) hours as needed for wheezing or shortness of breath. Donella Stade, PA-C Taking Active   albuterol (VENTOLIN HFA) 108 (90 Base) MCG/ACT inhaler [id number] Yes Inhale 2 puffs into the lungs every 6 (six) hours as needed for wheezing or shortness of breath. Donella Stade, PA-C Taking Active   atorvastatin (LIPITOR) 40 MG tablet [id number] Yes Take 1 tablet (40 mg total) by mouth daily. Donella Stade, PA-C Taking Active   Calcium Carbonate (CALCIUM 600 PO) [id number] Yes Take 1 tablet by mouth in the morning and at bedtime. [provider] Taking Active   cholecalciferol (VITAMIN D3) 25 MCG (1000 UNIT) tablet [id number] Yes Take 1,000 Units by mouth daily. [provider] Taking Active   fexofenadine [facility] ALLERGY) 180 MG tablet [id number] Yes Take 1 tablet (180 mg total) by mouth daily. Donella Stade, PA-C Taking Active   Fluticasone-Umeclidin-Vilant (TRELEGY ELLIPTA) 100-62.5-25 MCG/ACT AEPB [id number] Yes Inhale 1 puff into the lungs daily.  Donella Stade, PA-C Taking Active   hydrochlorothiazide (HYDRODIURIL) 12.5 MG tablet [id number] Yes Take 1 tablet (12.5 mg total) by mouth daily. As needed for lower leg edema. Breeback, Jade L, PA-C Taking Active   ipratropium-albuterol (DUONEB) 0.5-2.5 (3) MG/3ML SOLN [id number] Yes Take 3 mLs by nebulization every 2 (two) hours as needed (wheeze, SOB). Donella Stade, PA-C Taking Active   lansoprazole (PREVACID) 30 MG capsule [id number] Yes TAKE 1 CAPSULE EVERY DAY AT [address], Jade L, PA-C Taking Active   lisinopril (ZESTRIL) 20 MG tablet [id number] Yes Take 1 tablet (20 mg total) by mouth daily. Donella Stade, PA-C Taking Active   meclizine (ANTIVERT) 25 MG tablet [id number] Yes Take 1 tablet (25 mg total) by mouth 3 (  three) times daily as needed for dizziness. Donella Stade, PA-C Taking Active   meloxicam (MOBIC) 15 MG tablet [id number] Yes Take 1 tablet by mouth once daily Breeback, Jade L, PA-C Taking Active   montelukast (SINGULAIR) 10 MG tablet [id number] Yes TAKE 1 TABLET AT BEDTIME Breeback, Jade L, PA-C Taking Active   nirmatrelvir/ritonavir EUA, renal dosing, (PAXLOVID) 10 x 150 MG & 10 x 100MG  TABS [id number] Yes Take 2 tablets by mouth 2 (two) times daily for 5 days. (Take nirmatrelvir 150 mg one tablet twice daily for 5 days and ritonavir 100 mg one tablet twice daily for 5 days) Patient GFR is 55. Donella Stade, PA-C Taking Active   ondansetron (ZOFRAN) 8 MG tablet [id number] Yes Take 1 tablet (8 mg total) by mouth every 8 (eight) hours as needed for nausea or vomiting. Donella Stade, PA-C Taking Active   Rimegepant Sulfate (NURTEC) 75 MG TBDP [id number] Yes Take 1 tablet by mouth as needed. Donella Stade, PA-C Taking Active   rizatriptan (MAXALT-MLT) 10 MG disintegrating tablet [id number] Yes Take 1 tablet (10 mg total) by mouth as needed for migraine. May repeat in 2 hours if needed Donella Stade, PA-C Taking Active   vitamin C (ASCORBIC ACID) 500 MG tablet  [id number] Yes Take 500 mg by mouth daily. [provider] Taking Active             Patient Active Problem List   Diagnosis Date Noted   Migraine without aura and without status migrainosus, not intractable 03/28/2022   Seasonal allergic rhinitis due to pollen 12/24/2021   Seasonal allergies 12/24/2021   Decreased GFR 12/24/2021   Injury of right leg 10/14/2021   Bilateral leg edema 07/30/2021   Left ear impacted cerumen 07/30/2021   Vertigo 07/30/2021   Acute pain of left shoulder 06/03/2021   Coronary artery calcification seen on CT scan 04/17/2021   Aortic atherosclerosis ([place]) 04/17/2021   Centrilobular emphysema ([place]) 04/17/2021   Grade I diastolic dysfunction [id number]   CKD (chronic kidney disease) stage 3, GFR 30-59 ml/min ([place]) 02/20/2021   AKI (acute kidney injury) ([place]) 02/21/2020   Hyponatremia 02/20/2020   Hypocalcemia 02/20/2020   Low hemoglobin 02/20/2020   Herpes zoster with ophthalmic complication [id number]   Acute intractable headache 01/31/2020   Vision changes 01/31/2020   Non-intractable vomiting 01/31/2020   Eye pain, right 01/31/2020   Plantar fasciitis, left 01/24/2020   Diarrhea 11/08/2019   OSA (obstructive sleep apnea) 09/20/2019   Hyperlipidemia 08/18/2019   Non-restorative sleep 08/17/2019   Snoring 08/17/2019   Class 2 obesity due to excess calories without serious comorbidity with body mass index (BMI) of 36.0 to 36.9 in adult 08/17/2019   Cervical spondylosis 05/10/2019   Right shoulder pain 04/12/2019   SOB (shortness of breath) on exertion 01/11/2019   Depressed mood 01/11/2019   Osteopenia 03/17/2018   Chronic venous stasis 12/22/2017   Muscle cramp 12/22/2017   Edema of left ankle 12/22/2017   Reactive airway disease 06/09/2017   Former smoker 06/09/2017   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   Contusion 11/13/2015   Essential hypertension, benign 08/28/2015   Hiatal hernia 12/09/2013   Anxiety state 11/02/2013   GERD  (gastroesophageal reflux disease) 11/02/2013    Immunization History  Administered Date(s) Administered   Fluad Quad(high Dose 65+) 03/18/2019, 05/01/2020, 04/15/2021   Influenza,inj,Quad PF,6+ Mos 05/24/2014, 05/07/2015, 05/13/2016, 03/24/2017, 05/03/2018   PFIZER(Purple Top)SARS-COV-2 Vaccination 03/07/2020, 04/28/2020   Pneumococcal Conjugate-13 03/24/2017   Pneumococcal Polysaccharide-23 05/03/2018  Tdap 03/02/2018   Zoster Recombinat (Shingrix) 10/20/2019, 05/01/2020    Conditions to be addressed/monitored: HTN, HLD, and COPD  There are no care plans that you recently modified to display for this patient.   Medication Assistance: None required.  Patient affirms current coverage meets needs.  Patient's preferred pharmacy is:  [facility] [place], [place] [address] Phone: [phone] Fax: [phone]  [place], [place] - [address] [place] [address] Phone: [phone] Fax: [phone]  [facility] [phone] - [place], [place] - [place] [id number] [place] [address] Phone: [phone] Fax: [phone]  [facility] (New Address) - [place], [place] AT Previously: [address], [address] [place] [address] Phone: [phone] Fax: [phone]  Uses pill box? Yes Pt endorses 100% compliance  Follow Up:  Patient agrees to Care Plan and Follow-up.  Plan: Telephone follow up appointment with care management team member scheduled for:  1 year  Larinda Buttery, PharmD Clinical Pharmacist [facility] [phone]

## 2022-04-15 ENCOUNTER — Ambulatory Visit: Payer: Medicare HMO

## 2022-04-29 ENCOUNTER — Ambulatory Visit: Payer: Medicare HMO

## 2022-04-30 ENCOUNTER — Other Ambulatory Visit: Payer: Self-pay | Admitting: Neurology

## 2022-04-30 DIAGNOSIS — Z87891 Personal history of nicotine dependence: Secondary | ICD-10-CM

## 2022-04-30 DIAGNOSIS — J432 Centrilobular emphysema: Secondary | ICD-10-CM

## 2022-05-12 ENCOUNTER — Encounter: Payer: Self-pay | Admitting: Physician Assistant

## 2022-05-12 ENCOUNTER — Ambulatory Visit (INDEPENDENT_AMBULATORY_CARE_PROVIDER_SITE_OTHER): Payer: Medicare HMO | Admitting: Physician Assistant

## 2022-05-12 VITALS — BP 117/49 | HR 89 | Temp 97.6°F | Ht 63.0 in | Wt 198.0 lb

## 2022-05-12 DIAGNOSIS — R600 Localized edema: Secondary | ICD-10-CM

## 2022-05-12 DIAGNOSIS — J441 Chronic obstructive pulmonary disease with (acute) exacerbation: Secondary | ICD-10-CM | POA: Diagnosis not present

## 2022-05-12 MED ORDER — METHYLPREDNISOLONE SODIUM SUCC 125 MG IJ SOLR
125.0000 mg | Freq: Once | INTRAMUSCULAR | Status: AC
  Administered 2022-05-12: 125 mg via INTRAMUSCULAR

## 2022-05-12 MED ORDER — PREDNISONE 50 MG PO TABS: ORAL_TABLET | ORAL | 0 refills | Status: DC

## 2022-05-12 MED ORDER — HYDROCHLOROTHIAZIDE 12.5 MG PO TABS: 12.5000 mg | ORAL_TABLET | Freq: Every day | ORAL | 1 refills | Status: DC

## 2022-05-12 NOTE — Progress Notes (Signed)
Acute Office Visit  Subjective:     Patient ID: Ana King, female    DOB: March 20, 1951, 71 y.o.   MRN: [id number]  Chief Complaint  Patient presents with   Shortness of Breath    HPI Patient is in today for shortness of breath and dry cough for the last 4 days. She has COPD and allergies. She is on singulair, allegra and trelegy. She did great in [employer] and she walked a lot. No leg swelling. No chest pain. At home her pulse ox has dropped to low 90s. SOB worse with exertion. No fever, chills, sinus pressure, ear pain or sore throat. She is having a lot of sneezing. She is using her albuterol nebulizer and inhaler and does help her with symptoms.   .. 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   Reactive airway disease 06/09/2017   Former smoker 06/09/2017   Chronic venous stasis 12/22/2017   Muscle cramp 12/22/2017   Edema of left ankle 12/22/2017   Osteopenia 03/17/2018   SOB (shortness of breath) on exertion 01/11/2019   Depressed mood 01/11/2019   Right shoulder pain 04/12/2019   Cervical spondylosis 05/10/2019   Non-restorative sleep 08/17/2019   Snoring 08/17/2019   Class 2 obesity due to excess calories without serious comorbidity with body mass index (BMI) of 36.0 to 36.9 in adult 08/17/2019   Hyperlipidemia 08/18/2019   OSA (obstructive sleep apnea) 09/20/2019   Diarrhea 11/08/2019   Plantar fasciitis, left 01/24/2020   Acute intractable headache 01/31/2020   Vision changes 01/31/2020   Non-intractable vomiting 01/31/2020   Eye pain, right 01/31/2020   Herpes zoster with ophthalmic complication [id number]   Hyponatremia 02/20/2020   Hypocalcemia 02/20/2020   Low hemoglobin 02/20/2020   AKI (acute kidney injury) ([place]) 02/21/2020   CKD (chronic  kidney disease) stage 3, GFR 30-59 ml/min (HCC) 02/20/2021   Grade I diastolic dysfunction [id number]   Coronary artery calcification seen on CT scan 04/17/2021   Aortic atherosclerosis ([place]) 04/17/2021   Centrilobular emphysema ([place]) 04/17/2021   Acute pain of left shoulder 06/03/2021   Bilateral leg edema 07/30/2021   Left ear impacted cerumen 07/30/2021   Vertigo 07/30/2021   Injury of right leg 10/14/2021   Seasonal allergic rhinitis due to pollen 12/24/2021   Seasonal allergies 12/24/2021   Decreased GFR 12/24/2021   Migraine without aura and without status migrainosus, not intractable 03/28/2022   Resolved Ambulatory Problems    Diagnosis Date Noted   Tobacco abuse 11/02/2013   COPD (chronic obstructive pulmonary disease) ([place]) 11/02/2013   Past Medical History:  Diagnosis Date   Asthma      ROS  See HPI.     Objective:    BP (!) 117/49   Pulse 89   Temp 97.6 F (36.4 C) (Oral)   Ht 5\' 3"  (1.6 m)   Wt 198 lb (89.8 kg)   SpO2 99%   BMI 35.07 kg/m  BP Readings from Last 3 Encounters:  05/12/22 (!) 117/49  03/26/22 (!) 129/54  12/24/21 136/65   Wt Readings from Last 3 Encounters:  05/12/22 198 lb (89.8 kg)  03/26/22 197 lb (89.4 kg)  12/24/21 203 lb (92.1 kg)      Physical Exam Constitutional:      Appearance: She is well-developed. She is obese.  HENT:     Head: Normocephalic.     Mouth/Throat:     Mouth: Mucous membranes are moist.  Eyes:     Pupils: Pupils are equal, round, and reactive to light.  Cardiovascular:     Rate and Rhythm: Normal rate and regular rhythm.     Heart sounds: No murmur heard. Pulmonary:     Effort: Pulmonary effort is normal.     Breath sounds: No decreased breath sounds, wheezing, rhonchi or rales.  Musculoskeletal:     Cervical back: Normal range of motion.     Right lower leg: No tenderness. No edema.     Left lower leg: No tenderness. No edema.  Neurological:     General: No focal deficit present.     Mental  Status: She is alert and oriented to person, place, and time.  Psychiatric:        Mood and Affect: Mood normal.           Assessment & Plan:  Marland KitchenMarland KitchenMarlina was seen today for shortness of breath.  Diagnoses and all orders for this visit:  COPD exacerbation ([place]) -     predniSONE (DELTASONE) 50 MG tablet; One tab PO daily for 5 days. -     methylPREDNISolone sodium succinate (SOLU-MEDROL) 125 mg/2 mL injection 125 mg  Bilateral leg edema -     hydrochlorothiazide (HYDRODIURIL) 12.5 MG tablet; Take 1 tablet (12.5 mg total) by mouth daily. As needed for lower leg edema.   BP normal. No lower ext edema. Refilled HCTZ.  Pulse ox looks good in office today No lower ext edema Solumedrol IM 125mg  given in office today Prednisone for 5 days to start tomorrow If symptoms worsen or not improve call back to consider abx therapy  Iran Planas, PA-C

## 2022-05-13 ENCOUNTER — Ambulatory Visit (INDEPENDENT_AMBULATORY_CARE_PROVIDER_SITE_OTHER): Payer: Medicare HMO

## 2022-05-13 DIAGNOSIS — Z87891 Personal history of nicotine dependence: Secondary | ICD-10-CM | POA: Diagnosis not present

## 2022-05-13 DIAGNOSIS — J432 Centrilobular emphysema: Secondary | ICD-10-CM

## 2022-05-13 DIAGNOSIS — Z122 Encounter for screening for malignant neoplasm of respiratory organs: Secondary | ICD-10-CM | POA: Diagnosis not present

## 2022-05-16 ENCOUNTER — Other Ambulatory Visit: Payer: Self-pay | Admitting: *Deleted

## 2022-05-16 DIAGNOSIS — Z122 Encounter for screening for malignant neoplasm of respiratory organs: Secondary | ICD-10-CM

## 2022-05-16 DIAGNOSIS — Z87891 Personal history of nicotine dependence: Secondary | ICD-10-CM

## 2022-05-16 NOTE — Progress Notes (Signed)
Known emphysema and plaque build up in arteries. Stay on trelegy and lipitor. No other concerning findings. Repeat in 1year.

## 2022-05-30 ENCOUNTER — Ambulatory Visit (INDEPENDENT_AMBULATORY_CARE_PROVIDER_SITE_OTHER): Payer: Medicare HMO | Admitting: Physician Assistant

## 2022-05-30 VITALS — Temp 97.8°F | Ht 63.0 in | Wt 198.0 lb

## 2022-05-30 DIAGNOSIS — Z23 Encounter for immunization: Secondary | ICD-10-CM

## 2022-05-30 NOTE — Progress Notes (Signed)
Pt presents to day for immunization. No allergy to eggs or latex.   Location: LD  Pt tolerated well.   

## 2022-05-30 NOTE — Progress Notes (Signed)
Agree with above plan. 

## 2022-06-27 ENCOUNTER — Ambulatory Visit: Payer: Medicare HMO | Admitting: Physician Assistant

## 2022-06-30 ENCOUNTER — Other Ambulatory Visit: Payer: Self-pay | Admitting: Physician Assistant

## 2022-06-30 DIAGNOSIS — I10 Essential (primary) hypertension: Secondary | ICD-10-CM

## 2022-07-07 ENCOUNTER — Ambulatory Visit (INDEPENDENT_AMBULATORY_CARE_PROVIDER_SITE_OTHER): Payer: Medicare HMO | Admitting: Physician Assistant

## 2022-07-07 ENCOUNTER — Encounter: Payer: Self-pay | Admitting: Physician Assistant

## 2022-07-07 VITALS — BP 115/71 | HR 77 | Ht 63.0 in | Wt 199.0 lb

## 2022-07-07 DIAGNOSIS — J309 Allergic rhinitis, unspecified: Secondary | ICD-10-CM

## 2022-07-07 DIAGNOSIS — J302 Other seasonal allergic rhinitis: Secondary | ICD-10-CM | POA: Diagnosis not present

## 2022-07-07 DIAGNOSIS — J441 Chronic obstructive pulmonary disease with (acute) exacerbation: Secondary | ICD-10-CM | POA: Diagnosis not present

## 2022-07-07 DIAGNOSIS — J432 Centrilobular emphysema: Secondary | ICD-10-CM | POA: Diagnosis not present

## 2022-07-07 DIAGNOSIS — R0600 Dyspnea, unspecified: Secondary | ICD-10-CM | POA: Diagnosis not present

## 2022-07-07 MED ORDER — PREDNISONE 20 MG PO TABS: ORAL_TABLET | ORAL | 0 refills | Status: DC

## 2022-07-07 NOTE — Patient Instructions (Signed)
Will make referral to allergy

## 2022-07-07 NOTE — Progress Notes (Unsigned)
Established Patient Office Visit  Subjective   Patient ID: Ana King, female    DOB: Apr 25, 1951  Age: 71 y.o. MRN: [id number]  Chief Complaint  Patient presents with   Follow-up    breathing   Shortness of Breath    HPI Pt is a 71 yo with COPD who has had a few days of worsening SOB and cough. No fever, chills, body aches, or other URI symptoms. Denies any swelling or problems laying flat. Any exertion SOB increases. Albuterol inhaler and nebulizers do seem to help. She is taking singulair and allegra. Last echo 2022 and within normal limits. Last exacerbation was 2 months ago.   .. 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   Reactive airway disease 06/09/2017   Former smoker 06/09/2017   Chronic venous stasis 12/22/2017   Muscle cramp 12/22/2017   Edema of left ankle 12/22/2017   Osteopenia 03/17/2018   SOB (shortness of breath) on exertion 01/11/2019   Depressed mood 01/11/2019   Right shoulder pain 04/12/2019   Cervical spondylosis 05/10/2019   Non-restorative sleep 08/17/2019   Snoring 08/17/2019   Class 2 obesity due to excess calories without serious comorbidity with body mass index (BMI) of 36.0 to 36.9 in adult 08/17/2019   Hyperlipidemia 08/18/2019   OSA (obstructive sleep apnea) 09/20/2019   Diarrhea 11/08/2019   Plantar fasciitis, left 01/24/2020   Acute intractable headache 01/31/2020   Vision changes 01/31/2020   Non-intractable vomiting 01/31/2020   Eye pain, right 01/31/2020   Herpes zoster with ophthalmic complication 02/03/2020   Hyponatremia 02/20/2020   Hypocalcemia 02/20/2020   Low hemoglobin 02/20/2020   AKI (acute kidney injury) (HCC) 02/21/2020   CKD (chronic kidney disease) stage 3, GFR 30-59 ml/min ([facility]CC) 02/20/2021   Grade I  diastolic dysfunction 02/26/2021   Coronary artery calcification seen on CT scan 04/17/2021   Aortic atherosclerosis ([facility]) 04/17/2021   Centrilobular emphysema ([facility]) 04/17/2021   Acute pain of left shoulder 06/03/2021   Bilateral leg edema 07/30/2021   Left ear impacted cerumen 07/30/2021   Vertigo 07/30/2021   Injury of right leg 10/14/2021   Seasonal allergic rhinitis due to pollen 12/24/2021   Seasonal allergies 12/24/2021   Decreased GFR 12/24/2021   Migraine without aura and without status migrainosus, not intractable 03/28/2022   Chronic allergic rhinitis 07/08/2022   Resolved Ambulatory Problems    Diagnosis Date Noted   Tobacco abuse 11/02/2013   COPD (chronic obstructive pulmonary disease) ([facility]) 11/02/2013   Past Medical History:  Diagnosis Date   Asthma      ROS See HPI.    Objective:     BP 115/71 (BP Location: Left Arm, Patient Position: Sitting, Cuff Size: Normal)   Pulse 77   Ht 5\' 3"  (1.6 m)   Wt 199 lb 0.6 oz (90.3 kg)   SpO2 95%   BMI 35.26 kg/m  BP Readings from Last 3 Encounters:  07/07/22 115/71  05/12/22 (!) 117/49  03/26/22 (!) 129/54   Wt Readings from Last 3 Encounters:  07/07/22 199 lb 0.6 oz (90.3 kg)  05/30/22 198 lb (89.8 kg)  05/12/22 198 lb (89.8 kg)   Walk 05/14/22 test pulse ox stable at 95 percent.    Physical Exam Constitutional:      Appearance: She is well-developed.  HENT:  Head: Normocephalic.     Mouth/Throat:     Mouth: Mucous membranes are moist.     Pharynx: No pharyngeal swelling or oropharyngeal exudate.  Eyes:     Pupils: Pupils are equal, round, and reactive to light.  Cardiovascular:     Rate and Rhythm: Normal rate and regular rhythm.  Pulmonary:     Effort: Pulmonary effort is normal.     Breath sounds: Normal breath sounds. No decreased breath sounds, wheezing, rhonchi or rales.  Chest:     Chest wall: No mass, tenderness or edema.  Musculoskeletal:     Right lower leg: No tenderness.     Left  lower leg: No tenderness.  Neurological:     General: No focal deficit present.     Mental Status: She is alert.  Psychiatric:        Mood and Affect: Mood normal.        Assessment & Plan:  Marland KitchenMarland KitchenDalisha was seen today for follow-up and shortness of breath.  Diagnoses and all orders for this visit:  Dyspnea, unspecified type -     predniSONE (DELTASONE) 20 MG tablet; Take 3 tablets for 3 days, take 2 tablets for 3 days, take 1 tablet for 3 days, take 1/2 tablet for 4 days. -     Ambulatory referral to Allergy  COPD exacerbation ([facility]CC) -     predniSONE (DELTASONE) 20 MG tablet; Take 3 tablets for 3 days, take 2 tablets for 3 days, take 1 tablet for 3 days, take 1/2 tablet for 4 days. -     Ambulatory referral to Allergy  Centrilobular emphysema ([facility]CC) -     Ambulatory referral to Allergy  Seasonal allergies -     Ambulatory referral to Allergy  Chronic allergic rhinitis -     Ambulatory referral to Allergy   Vitals stable and look good No change in O2 stats with 6 minute walk test Lungs sound good Last echo 2022 WNL No signs of fluid overload Albuterol does help ? If allergies causing exacerbations On singulair and allegra Astelin nasal spray Referral made to allergy Prednisone burst given today No signs of bacterial infection Follow up as needed or if symptoms persist or worsen   Tandy Gaw, PA-C

## 2022-07-08 ENCOUNTER — Encounter: Payer: Self-pay | Admitting: Physician Assistant

## 2022-07-08 DIAGNOSIS — J309 Allergic rhinitis, unspecified: Secondary | ICD-10-CM | POA: Insufficient documentation

## 2022-07-17 ENCOUNTER — Other Ambulatory Visit: Payer: Self-pay | Admitting: Physician Assistant

## 2022-07-17 DIAGNOSIS — I10 Essential (primary) hypertension: Secondary | ICD-10-CM

## 2022-08-04 ENCOUNTER — Encounter: Payer: Self-pay | Admitting: Physician Assistant

## 2022-08-04 ENCOUNTER — Ambulatory Visit (INDEPENDENT_AMBULATORY_CARE_PROVIDER_SITE_OTHER): Payer: Medicare HMO | Admitting: Physician Assistant

## 2022-08-04 VITALS — BP 123/62 | HR 84 | Ht 63.0 in | Wt 200.0 lb

## 2022-08-04 DIAGNOSIS — L299 Pruritus, unspecified: Secondary | ICD-10-CM

## 2022-08-04 MED ORDER — HYDROXYZINE PAMOATE 25 MG PO CAPS: 25.0000 mg | ORAL_CAPSULE | Freq: Three times a day (TID) | ORAL | 0 refills | Status: AC | PRN

## 2022-08-04 NOTE — Progress Notes (Unsigned)
   Acute Office Visit  Subjective:     Patient ID: Ana King, female    DOB: 14-Sep-1950, 72 y.o.   MRN: [id number]  No chief complaint on file.   HPI Patient is in today for ***  2 days of itching.   ROS      Objective:    There were no vitals taken for this visit. {Vitals History (Optional):23777}  Physical Exam  No results found for any visits on 08/04/22.      Assessment & Plan:   Problem List Items Addressed This Visit   None   No orders of the defined types were placed in this encounter.   No follow-ups on file.  Iran Planas, PA-C

## 2022-08-05 ENCOUNTER — Encounter: Payer: Self-pay | Admitting: Physician Assistant

## 2022-08-05 DIAGNOSIS — L299 Pruritus, unspecified: Secondary | ICD-10-CM | POA: Insufficient documentation

## 2022-08-19 DIAGNOSIS — J302 Other seasonal allergic rhinitis: Secondary | ICD-10-CM | POA: Diagnosis not present

## 2022-08-19 DIAGNOSIS — J449 Chronic obstructive pulmonary disease, unspecified: Secondary | ICD-10-CM | POA: Diagnosis not present

## 2022-08-20 DIAGNOSIS — J449 Chronic obstructive pulmonary disease, unspecified: Secondary | ICD-10-CM | POA: Diagnosis not present

## 2022-08-20 DIAGNOSIS — J4550 Severe persistent asthma, uncomplicated: Secondary | ICD-10-CM | POA: Diagnosis not present

## 2022-08-20 DIAGNOSIS — J302 Other seasonal allergic rhinitis: Secondary | ICD-10-CM | POA: Diagnosis not present

## 2022-09-03 ENCOUNTER — Encounter: Payer: Self-pay | Admitting: Physician Assistant

## 2022-09-03 ENCOUNTER — Ambulatory Visit (INDEPENDENT_AMBULATORY_CARE_PROVIDER_SITE_OTHER): Payer: Medicare HMO | Admitting: Physician Assistant

## 2022-09-03 ENCOUNTER — Ambulatory Visit (INDEPENDENT_AMBULATORY_CARE_PROVIDER_SITE_OTHER): Payer: Medicare HMO

## 2022-09-03 ENCOUNTER — Other Ambulatory Visit: Payer: Self-pay | Admitting: Physician Assistant

## 2022-09-03 VITALS — BP 132/62 | HR 80 | Wt 200.0 lb

## 2022-09-03 DIAGNOSIS — M10272 Drug-induced gout, left ankle and foot: Secondary | ICD-10-CM | POA: Diagnosis not present

## 2022-09-03 DIAGNOSIS — M79674 Pain in right toe(s): Secondary | ICD-10-CM

## 2022-09-03 DIAGNOSIS — M109 Gout, unspecified: Secondary | ICD-10-CM | POA: Diagnosis not present

## 2022-09-03 DIAGNOSIS — M79675 Pain in left toe(s): Secondary | ICD-10-CM | POA: Diagnosis not present

## 2022-09-03 MED ORDER — PREDNISONE 10 MG PO TABS: ORAL_TABLET | ORAL | 0 refills | Status: DC

## 2022-09-03 NOTE — Patient Instructions (Signed)
Gout  Gout is a condition that causes painful swelling of the joints. Gout is a type of inflammation of the joints (arthritis). This condition is caused by having too much uric acid in the body. Uric acid is a chemical that forms when the body breaks down substances called purines. Purines are important for building body proteins. When the body has too much uric acid, sharp crystals can form and build up inside the joints. This causes pain and swelling. Gout attacks can happen quickly and may be very painful (acute gout). Over time, the attacks can affect more joints and become more frequent (chronic gout). Gout can also cause uric acid to build up under the skin and inside the kidneys. What are the causes? This condition is caused by too much uric acid in your blood. This can happen because: Your kidneys do not remove enough uric acid from your blood. This is the most common cause. Your body makes too much uric acid. This can happen with some cancers and cancer treatments. It can also occur if your body is breaking down too many red blood cells (hemolytic anemia). You eat too many foods that are high in purines. These foods include organ meats and some seafood. Alcohol, especially beer, is also high in purines. A gout attack may be triggered by trauma or stress. What increases the risk? The following factors may make you more likely to develop this condition: Having a family history of gout. Being female and middle-aged. Being female and having gone through menopause. Taking certain medicines, including aspirin, cyclosporine, diuretics, levodopa, and niacin. Having an organ transplant. Having certain conditions, such as: Being obese. Lead poisoning. Kidney disease. A skin condition called psoriasis. Other factors include: Losing weight too quickly. Being dehydrated. Frequently drinking alcohol, especially beer. Frequently drinking beverages that are sweetened with a type of sugar called  fructose. What are the signs or symptoms? An attack of acute gout happens quickly. It usually occurs in just one joint. The most common place is the big toe. Attacks often start at night. Other joints that may be affected include joints of the feet, ankle, knee, fingers, wrist, or elbow. Symptoms of this condition may include: Severe pain. Warmth. Swelling. Stiffness. Tenderness. The affected joint may be very painful to touch. Shiny, red, or purple skin. Chills and fever. Chronic gout may cause symptoms more frequently. More joints may be involved. You may also have white or yellow lumps (tophi) on your hands or feet or in other areas near your joints. How is this diagnosed? This condition is diagnosed based on your symptoms, your medical history, and a physical exam. You may have tests, such as: Blood tests to measure uric acid levels. Removal of joint fluid with a thin needle (aspiration) to look for uric acid crystals. X-rays to look for joint damage. How is this treated? Treatment for this condition has two phases: treating an acute attack and preventing future attacks. Acute gout treatment may include medicines to reduce pain and swelling, including: NSAIDs, such as ibuprofen. Steroids. These are strong anti-inflammatory medicines that can be taken by mouth (orally) or injected into a joint. Colchicine. This medicine relieves pain and swelling when it is taken soon after an attack. It can be given by mouth or through an IV. Preventive treatment may include: Daily use of smaller doses of NSAIDs or colchicine. Use of a medicine that reduces uric acid levels in your blood, such as allopurinol. Changes to your diet. You may need to see   a dietitian about what to eat and drink to prevent gout. Follow these instructions at home: During a gout attack  If directed, put ice on the affected area. To do this: Put ice in a plastic bag. Place a towel between your skin and the bag. Leave the  ice on for 20 minutes, 2-3 times a day. Remove the ice if your skin turns bright red. This is very important. If you cannot feel pain, heat, or cold, you have a greater risk of damage to the area. Raise (elevate) the affected joint above the level of your heart as often as possible. Rest the joint as much as possible. If the affected joint is in your leg, you may be given crutches to use. Follow instructions from your health care provider about eating or drinking restrictions. Avoiding future gout attacks Follow a low-purine diet as told by your dietitian or health care provider. Avoid foods and drinks that are high in purines, including liver, kidney, anchovies, asparagus, herring, mushrooms, mussels, and beer. Maintain a healthy weight or lose weight if you are overweight. If you want to lose weight, talk with your health care provider. Do not lose weight too quickly. Start or maintain an exercise program as told by your health care provider. Eating and drinking Avoid drinking beverages that contain fructose. Drink enough fluids to keep your urine pale yellow. If you drink alcohol: Limit how much you have to: 0-1 drink a day for women who are not pregnant. 0-2 drinks a day for men. Know how much alcohol is in a drink. In the [place] one drink equals one 12 oz bottle of beer (355 mL), one 5 oz glass of wine (148 mL), or one 1 oz glass of hard liquor (44 mL). General instructions Take over-the-counter and prescription medicines only as told by your health care provider. Ask your health care provider if the medicine prescribed to you requires you to avoid driving or using machinery. Return to your normal activities as told by your health care provider. Ask your health care provider what activities are safe for you. Keep all follow-up visits. This is important. Where to find more information National [employer] of Health: www.niams.nih.gov Contact a health care provider if you have: Another  gout attack. Continuing symptoms of a gout attack after 10 days of treatment. Side effects from your medicines. Chills or a fever. Burning pain when you urinate. Pain in your lower back or abdomen. Get help right away if you: Have severe or uncontrolled pain. Cannot urinate. Summary Gout is painful swelling of the joints caused by having too much uric acid in the body. The most common site for gout to occur is in the big toe, but it can affect other joints in the body. Medicines and dietary changes can help to prevent and treat gout attacks. This information is not intended to replace advice given to you by your health care provider. Make sure you discuss any questions you have with your health care provider. Document Revised: 04/10/2021 Document Reviewed: 04/10/2021 Elsevier Patient Education  2023 [employer]  

## 2022-09-03 NOTE — Progress Notes (Signed)
Acute Office Visit  Subjective:     Patient ID: Ana King, female    DOB: August 01, 1950, 72 y.o.   MRN: [id number]  Chief Complaint  Patient presents with   Toe Pain    Toe Pain    72 year old female presenting with CC of big toe pain. Pt states 2 days ago she began having pain, swelling, and erythema of her left great toe that woke her up in the middle of the night. 2 weeks ago earlier she says she was walking and felt some pain and didn't think much of it, she mentions she feels as if it is unrelated. She states the pain has progressively gotten worse to where she is now having a hard time bearing weight. She has tried tylenol, resting, ice, and elevation. She does not feel as if this is helping. She states she has not had any recent dietary changes, she eats red meat but does not eat a lot. She does not drink alcohol. She is on HCTZ. She endorses great toe pain, erythema, swelling. Denies worsening erythema, fever, loss of sensation.  Review of Systems  Constitutional:  Negative for fever and malaise/fatigue.  HENT: Negative.    Eyes: Negative.   Respiratory: Negative.    Cardiovascular:  Negative for claudication and leg swelling.  Gastrointestinal: Negative.   Musculoskeletal:  Positive for joint pain. Negative for falls and myalgias.  Skin:        Erythema over Right great toe  Neurological: Negative.   Psychiatric/Behavioral: Negative.          Objective:    BP 132/62 (BP Location: Right Arm, Patient Position: Sitting, Cuff Size: Large)   Pulse 80   Wt 200 lb (90.7 kg)   SpO2 100%   BMI 35.43 kg/m  BP Readings from Last 3 Encounters:  09/03/22 132/62  08/04/22 123/62  07/07/22 115/71   Wt Readings from Last 3 Encounters:  09/03/22 200 lb (90.7 kg)  08/04/22 200 lb (90.7 kg)  07/07/22 199 lb 0.6 oz (90.3 kg)      Physical Exam Constitutional:      Appearance: Normal appearance. She is obese.  HENT:     Head: Normocephalic and atraumatic.     Nose:  Nose normal.  Eyes:     Extraocular Movements: Extraocular movements intact.     Conjunctiva/sclera: Conjunctivae normal.  Cardiovascular:     Pulses: Normal pulses.  Pulmonary:     Effort: Pulmonary effort is normal.  Musculoskeletal:        General: Swelling and tenderness present. No signs of injury.     Cervical back: Normal range of motion.     Comments: Bilateral bunions of great toe. Left Great Toe TTP, erythema, swelling, pain with ROM. Exam findings consistent for gout   Skin:    General: Skin is warm.     Capillary Refill: Capillary refill takes less than 2 seconds.     Findings: Erythema present.  Neurological:     General: No focal deficit present.     Mental Status: She is alert and oriented to person, place, and time.  Psychiatric:        Mood and Affect: Mood normal.        Behavior: Behavior normal.         Assessment & Plan:   Ana King was seen today for toe pain.  Diagnoses and all orders for this visit:  Great toe pain, left -     CBC w/Diff/Platelet -  Uric acid -     BASIC METABOLIC PANEL WITH GFR -     Cancel: DG Toe Great Right; Future -     predniSONE (DELTASONE) 10 MG tablet; Take 4 tablets for 3 days, then 3 tablets for 3 days, then 1 tablet for 3 days then 1/2 tablet for 4 days.  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 History of CKD, avoid NSAIDs at this time until lab results return If gout is underlying cause, discussed need for dietary changes, potential need to change HCTZ. Colchicine to be provided incase of repeat flare depending on uric acid results Ordered BMP, CBC to check kidney function   Personally reviewed xray of great toe appears like erosions to conclude gout.     Iran Planas, PA-C

## 2022-09-04 LAB — BASIC METABOLIC PANEL WITH GFR
BUN/Creatinine Ratio: 20 (calc) (ref 6–22)
BUN: 29 mg/dL — ABNORMAL HIGH (ref 7–25)
CO2: 24 mmol/L (ref 20–32)
Calcium: 10.5 mg/dL — ABNORMAL HIGH (ref 8.6–10.4)
Chloride: 106 mmol/L (ref 98–110)
Creat: 1.43 mg/dL — ABNORMAL HIGH (ref 0.60–1.00)
Glucose, Bld: 83 mg/dL (ref 65–99)
Potassium: 5.3 mmol/L (ref 3.5–5.3)
Sodium: 142 mmol/L (ref 135–146)
eGFR: 39 mL/min/{1.73_m2} — ABNORMAL LOW (ref >=60)

## 2022-09-04 LAB — CBC WITH DIFFERENTIAL/PLATELET
Absolute Monocytes: 594 cells/uL (ref 200–950)
Basophils Absolute: 10 cells/uL (ref 0–200)
Basophils Relative: 0.1 %
Eosinophils Absolute: 574 cells/uL — ABNORMAL HIGH (ref 15–500)
Eosinophils Relative: 5.8 %
HCT: 34.1 % — ABNORMAL LOW (ref 35.0–45.0)
Hemoglobin: 11.5 g/dL — ABNORMAL LOW (ref 11.7–15.5)
Lymphs Abs: 2287 cells/uL (ref 850–3900)
MCH: 30.5 pg (ref 27.0–33.0)
MCHC: 33.7 g/dL (ref 32.0–36.0)
MCV: 90.5 fL (ref 80.0–100.0)
MPV: 10.7 fL (ref 7.5–12.5)
Monocytes Relative: 6 %
Neutro Abs: 6435 cells/uL (ref 1500–7800)
Neutrophils Relative %: 65 %
Platelets: 288 10*3/uL (ref 140–400)
RBC: 3.77 10*6/uL — ABNORMAL LOW (ref 3.80–5.10)
RDW: 12.4 % (ref 11.0–15.0)
Total Lymphocyte: 23.1 %
WBC: 9.9 10*3/uL (ref 3.8–10.8)

## 2022-09-04 LAB — URIC ACID: Uric Acid, Serum: 9.3 mg/dL — ABNORMAL HIGH (ref 2.5–7.0)

## 2022-09-05 ENCOUNTER — Other Ambulatory Visit: Payer: Self-pay | Admitting: Neurology

## 2022-09-05 ENCOUNTER — Encounter: Payer: Self-pay | Admitting: Physician Assistant

## 2022-09-05 DIAGNOSIS — M109 Gout, unspecified: Secondary | ICD-10-CM | POA: Insufficient documentation

## 2022-09-05 DIAGNOSIS — N289 Disorder of kidney and ureter, unspecified: Secondary | ICD-10-CM

## 2022-09-05 NOTE — Progress Notes (Signed)
Uric acid level is elevated, indicating gout flare. Prednisone should help. Need to stop HCTZ and recheck BP in 1 week.  Kidney function has decreased as well. Make sure staying hydrated and recheck in 1 week.

## 2022-09-08 NOTE — Progress Notes (Signed)
Xray shows erosion and supports gout diagnosis of left great toe.

## 2022-09-12 ENCOUNTER — Ambulatory Visit (INDEPENDENT_AMBULATORY_CARE_PROVIDER_SITE_OTHER): Payer: Medicare HMO | Admitting: Physician Assistant

## 2022-09-12 VITALS — BP 108/62 | HR 70 | Ht 63.0 in | Wt 203.0 lb

## 2022-09-12 DIAGNOSIS — N289 Disorder of kidney and ureter, unspecified: Secondary | ICD-10-CM | POA: Diagnosis not present

## 2022-09-12 DIAGNOSIS — Z79899 Other long term (current) drug therapy: Secondary | ICD-10-CM

## 2022-09-12 DIAGNOSIS — I10 Essential (primary) hypertension: Secondary | ICD-10-CM

## 2022-09-12 NOTE — Patient Instructions (Signed)
Take 1/2 tablet of Lisinopril instead of whole tablet. Recheck blood pressure in 2 weeks.

## 2022-09-12 NOTE — Progress Notes (Signed)
Patient is here for blood pressure check. Pt stopped taking HCTZ. Gout has improved.   Previous BP was 132/62  1st BP today: 98/64 (cuff); 108/62 (manual)  Pt experienced dizziness this morning. She has been fasting today and only had enough water to take medication.   Per Alden Hipp, patient is to take '10mg'$  of Lisinopril. Cut current '20mg'$  tablet in 1/2. Pt has been advised. Recheck BP in 2 weeks.

## 2022-09-12 NOTE — Progress Notes (Signed)
BP very low. See plan below.

## 2022-09-13 LAB — RENAL FUNCTION PANEL
Albumin: 4 g/dL (ref 3.6–5.1)
BUN/Creatinine Ratio: 23 (calc) — ABNORMAL HIGH (ref 6–22)
BUN: 29 mg/dL — ABNORMAL HIGH (ref 7–25)
CO2: 27 mmol/L (ref 20–32)
Calcium: 9.8 mg/dL (ref 8.6–10.4)
Chloride: 106 mmol/L (ref 98–110)
Creat: 1.25 mg/dL — ABNORMAL HIGH (ref 0.60–1.00)
Glucose, Bld: 96 mg/dL (ref 65–99)
Phosphorus: 3.3 mg/dL (ref 2.1–4.3)
Potassium: 5.3 mmol/L (ref 3.5–5.3)
Sodium: 142 mmol/L (ref 135–146)

## 2022-09-15 ENCOUNTER — Other Ambulatory Visit: Payer: Self-pay

## 2022-09-15 MED ORDER — MECLIZINE HCL 25 MG PO TABS: 25.0000 mg | ORAL_TABLET | Freq: Three times a day (TID) | ORAL | 0 refills | Status: DC | PRN

## 2022-09-15 NOTE — Progress Notes (Signed)
Improving but not quite to baseline. Still looks like kidney are too dry. Drink more fluids.

## 2022-09-16 ENCOUNTER — Encounter: Payer: Self-pay | Admitting: Physician Assistant

## 2022-09-16 ENCOUNTER — Ambulatory Visit (INDEPENDENT_AMBULATORY_CARE_PROVIDER_SITE_OTHER): Payer: Medicare HMO | Admitting: Physician Assistant

## 2022-09-16 VITALS — BP 128/61 | HR 71 | Ht 63.0 in | Wt 199.5 lb

## 2022-09-16 DIAGNOSIS — H8111 Benign paroxysmal vertigo, right ear: Secondary | ICD-10-CM | POA: Diagnosis not present

## 2022-09-16 DIAGNOSIS — I959 Hypotension, unspecified: Secondary | ICD-10-CM

## 2022-09-16 NOTE — Progress Notes (Signed)
   Acute Office Visit  Subjective:     Patient ID: Ana King, female    DOB: 09-12-1950, 72 y.o.   MRN: [id number]  Chief Complaint  Patient presents with   Dizziness    HPI Patient is in today for dizziness. This started Sunday while she was getting ready for church. She described her symptoms as the room spinning and caused her to lose her balance and fall. She scraped her elbow but no other injuries. She has a history of vertigo but says this was the worst episode she has had. Normally she gets a little dizzy and it resolves with Meclizine. She did not take Meclizine on Sunday because she thought she was sick. She feels much better but still a little dizzy. Denies hearing loss, tinnitus, vision changes, shortness of breath, or chest pains.   She is also concerned about her blood pressure dropping too low at night. She says her daughter has been taking it in the evening and it runs 100s/50s. She was here last week for a blood pressure check with a hypotensive reading and was decreased to 18m of lisinopril. She also stopped her Hctz recently due to gout. BP in the office today was normal 128/61.   ROS See HPI      Objective:    BP 128/61 (BP Location: Left Arm, Patient Position: Sitting, Cuff Size: Large)   Pulse 71   Ht 5' 3"$  (1.6 m)   Wt 90.5 kg   SpO2 99%   BMI 35.34 kg/m  BP Readings from Last 3 Encounters:  09/16/22 128/61  09/12/22 108/62  09/03/22 132/62      Physical Exam Vitals reviewed.  Constitutional:      Appearance: Normal appearance.  HENT:     Head: Normocephalic and atraumatic.     Right Ear: Tympanic membrane normal.     Left Ear: Tympanic membrane normal.  Eyes:     Extraocular Movements: Extraocular movements intact.  Cardiovascular:     Rate and Rhythm: Normal rate and regular rhythm.  Pulmonary:     Effort: Pulmonary effort is normal.     Breath sounds: Normal breath sounds.  Musculoskeletal:        General: Normal range of motion.   Skin:    Comments: 3 cm erythematous lesion on right forearm. Appears clean and healing.   Neurological:     Mental Status: She is alert and oriented to person, place, and time.     Comments: Dix hallpike maneuver reproduced dizziness on the right side. No nystagmus.   Psychiatric:        Mood and Affect: Mood normal.         Assessment & Plan:  DViniewas seen today for dizziness.  Diagnoses and all orders for this visit:  BPPV (benign paroxysmal positional vertigo), right -     Ambulatory referral to Physical Therapy  Hypotension, unspecified hypotension type   Take meclizine as needed for vertigo episodes  Referral to vestibular rehab  Continue to keep lesion on arm clean Stop lisinopril due to hypotensive BP readings at home  Continue to check BP at home  Recheck BP in office in 1 week

## 2022-09-16 NOTE — Patient Instructions (Addendum)
Stop lisinopril recheck BP in 1 week Take antivert and referral to vestibular rehab Benign Positional Vertigo Vertigo is the feeling that you or your surroundings are moving when they are not. Benign positional vertigo is the most common form of vertigo. This is usually a harmless condition (benign). This condition is positional. This means that symptoms are triggered by certain movements and positions. This condition can be dangerous if it occurs while you are doing something that could cause harm to yourself or others. This includes activities such as driving or operating machinery. What are the causes? The inner ear has fluid-filled canals that help your brain sense movement and balance. When the fluid moves, the brain receives messages about your body's position. With benign positional vertigo, calcium crystals in the inner ear break free and disturb the inner ear area. This causes your brain to receive confusing messages about your body's position. What increases the risk? You are more likely to develop this condition if: You are a woman. You are 71 years of age or older. You have recently had a head injury. You have an inner ear disease. What are the signs or symptoms? Symptoms of this condition usually happen when you move your head or your eyes in different directions. Symptoms may start suddenly and usually last for less than a minute. They include: Loss of balance and falling. Feeling like you are spinning or moving. Feeling like your surroundings are spinning or moving. Nausea and vomiting. Blurred vision. Dizziness. Involuntary eye movement (nystagmus). Symptoms can be mild and cause only minor problems, or they can be severe and interfere with daily life. Episodes of benign positional vertigo may return (recur) over time. Symptoms may also improve over time. How is this diagnosed? This condition may be diagnosed based on: Your medical history. A physical exam of the head,  neck, and ears. Positional tests to check for or stimulate vertigo. You may be asked to turn your head and change positions, such as going from sitting to lying down. A health care provider will watch for symptoms of vertigo. You may be referred to a health care provider who specializes in ear, nose, and throat problems (ENT or otolaryngologist) or a provider who specializes in disorders of the nervous system (neurologist). How is this treated?  This condition may be treated in a session in which your health care provider moves your head in specific positions to help the displaced crystals in your inner ear move. Treatment for this condition may take several sessions. Surgery may be needed in severe cases, but this is rare. In some cases, benign positional vertigo may resolve on its own in 2-4 weeks. Follow these instructions at home: Safety Move slowly. Avoid sudden body or head movements or certain positions, as told by your health care provider. Avoid driving or operating machinery until your health care provider says it is safe. Avoid doing any tasks that would be dangerous to you or others if vertigo occurs. If you have trouble walking or keeping your balance, try using a cane for stability. If you feel dizzy or unstable, sit down right away. Return to your normal activities as told by your health care provider. Ask your health care provider what activities are safe for you. General instructions Take over-the-counter and prescription medicines only as told by your health care provider. Drink enough fluid to keep your urine pale yellow. Keep all follow-up visits. This is important. Contact a health care provider if: You have a fever. Your condition gets  worse or you develop new symptoms. Your family or friends notice any behavioral changes. You have nausea or vomiting that gets worse. You have numbness or a prickling and tingling sensation. Get help right away if you: Have difficulty  speaking or moving. Are always dizzy or faint. Develop severe headaches. Have weakness in your legs or arms. Have changes in your hearing or vision. Develop a stiff neck. Develop sensitivity to light. These symptoms may represent a serious problem that is an emergency. Do not wait to see if the symptoms will go away. Get medical help right away. Call your local emergency services (911 in the [place] Do not drive yourself to the hospital. Summary Vertigo is the feeling that you or your surroundings are moving when they are not. Benign positional vertigo is the most common form of vertigo. This condition is caused by calcium crystals in the inner ear that become displaced. This causes a disturbance in an area of the inner ear that helps your brain sense movement and balance. Symptoms include loss of balance and falling, feeling that you or your surroundings are moving, nausea and vomiting, and blurred vision. This condition can be diagnosed based on symptoms, a physical exam, and positional tests. Follow safety instructions as told by your health care provider and keep all follow-up visits. This is important. This information is not intended to replace advice given to you by your health care provider. Make sure you discuss any questions you have with your health care provider. Document Revised: 06/06/2020 Document Reviewed: 06/06/2020 Elsevier Patient Education  [place].

## 2022-09-22 ENCOUNTER — Encounter: Payer: Self-pay | Admitting: Physician Assistant

## 2022-09-22 DIAGNOSIS — J302 Other seasonal allergic rhinitis: Secondary | ICD-10-CM | POA: Diagnosis not present

## 2022-09-22 DIAGNOSIS — F17211 Nicotine dependence, cigarettes, in remission: Secondary | ICD-10-CM | POA: Diagnosis not present

## 2022-09-22 DIAGNOSIS — J449 Chronic obstructive pulmonary disease, unspecified: Secondary | ICD-10-CM | POA: Diagnosis not present

## 2022-09-22 DIAGNOSIS — R0602 Shortness of breath: Secondary | ICD-10-CM | POA: Diagnosis not present

## 2022-09-22 DIAGNOSIS — Z9989 Dependence on other enabling machines and devices: Secondary | ICD-10-CM | POA: Diagnosis not present

## 2022-09-22 DIAGNOSIS — Z122 Encounter for screening for malignant neoplasm of respiratory organs: Secondary | ICD-10-CM | POA: Diagnosis not present

## 2022-09-22 DIAGNOSIS — G4733 Obstructive sleep apnea (adult) (pediatric): Secondary | ICD-10-CM | POA: Diagnosis not present

## 2022-09-24 ENCOUNTER — Ambulatory Visit (INDEPENDENT_AMBULATORY_CARE_PROVIDER_SITE_OTHER): Payer: Medicare HMO | Admitting: Physician Assistant

## 2022-09-24 VITALS — BP 134/64 | HR 74 | Resp 20

## 2022-09-24 DIAGNOSIS — I959 Hypotension, unspecified: Secondary | ICD-10-CM

## 2022-09-24 NOTE — Progress Notes (Unsigned)
   Subjective:    Patient ID: Ana King, female    DOB: 02-Mar-1951, 72 y.o.   MRN: [id number]  HPI  Patient is here for a Blood Pressure check. Denies trouble sleeping, palpitations or medication problems.  Review of Systems     Objective:   Physical Exam        Assessment & Plan:   A refill for Lisinopril 10 MG will be sent to the pharmacy. Patient also requested a refill for Trelegy to be sent to her pharmacy. Patient advised to follow up with PCP in 3 Months.

## 2022-09-25 MED ORDER — TRELEGY ELLIPTA 100-62.5-25 MCG/ACT IN AEPB: 1.0000 | INHALATION_SPRAY | Freq: Every day | RESPIRATORY_TRACT | 4 refills | Status: DC

## 2022-09-25 MED ORDER — LISINOPRIL 10 MG PO TABS: 10.0000 mg | ORAL_TABLET | Freq: Every day | ORAL | 0 refills | Status: DC

## 2022-09-25 NOTE — Progress Notes (Signed)
Agree with below plan.

## 2022-10-03 ENCOUNTER — Ambulatory Visit: Payer: Medicare HMO | Attending: Physician Assistant | Admitting: Rehabilitative and Restorative Service Providers"

## 2022-10-03 ENCOUNTER — Other Ambulatory Visit: Payer: Self-pay

## 2022-10-03 ENCOUNTER — Encounter: Payer: Self-pay | Admitting: Rehabilitative and Restorative Service Providers"

## 2022-10-03 DIAGNOSIS — H8111 Benign paroxysmal vertigo, right ear: Secondary | ICD-10-CM | POA: Insufficient documentation

## 2022-10-03 DIAGNOSIS — R42 Dizziness and giddiness: Secondary | ICD-10-CM

## 2022-10-03 DIAGNOSIS — R2689 Other abnormalities of gait and mobility: Secondary | ICD-10-CM
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
## 2022-10-06 ENCOUNTER — Ambulatory Visit: Payer: Medicare HMO

## 2022-10-06 DIAGNOSIS — H8111 Benign paroxysmal vertigo, right ear: Secondary | ICD-10-CM | POA: Diagnosis not present

## 2022-10-06 DIAGNOSIS — R42 Dizziness and giddiness: Secondary | ICD-10-CM

## 2022-10-06 DIAGNOSIS — R2689 Other abnormalities of gait and mobility: Secondary | ICD-10-CM
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
## 2022-10-08 ENCOUNTER — Encounter: Payer: Medicare HMO | Admitting: Rehabilitative and Restorative Service Providers"

## 2022-10-08 DIAGNOSIS — J449 Chronic obstructive pulmonary disease, unspecified: Secondary | ICD-10-CM | POA: Diagnosis not present

## 2022-10-08 DIAGNOSIS — J302 Other seasonal allergic rhinitis: Secondary | ICD-10-CM | POA: Diagnosis not present

## 2022-10-22 ENCOUNTER — Other Ambulatory Visit: Payer: Self-pay

## 2022-10-22 DIAGNOSIS — J432 Centrilobular emphysema: Secondary | ICD-10-CM

## 2022-10-22 MED ORDER — ALBUTEROL SULFATE HFA 108 (90 BASE) MCG/ACT IN AERS: 2.0000 | INHALATION_SPRAY | Freq: Four times a day (QID) | RESPIRATORY_TRACT | 2 refills | Status: DC | PRN

## 2022-10-28 ENCOUNTER — Encounter: Payer: Self-pay | Admitting: Physician Assistant

## 2022-10-28 ENCOUNTER — Ambulatory Visit (INDEPENDENT_AMBULATORY_CARE_PROVIDER_SITE_OTHER): Payer: Medicare HMO | Admitting: Physician Assistant

## 2022-10-28 DIAGNOSIS — M25511 Pain in right shoulder: Secondary | ICD-10-CM

## 2022-10-28 MED ORDER — MELOXICAM 15 MG PO TABS: 15.0000 mg | ORAL_TABLET | Freq: Every day | ORAL | 0 refills | Status: AC

## 2022-10-28 NOTE — Patient Instructions (Signed)
Proximal Biceps Tendinitis and Tenosynovitis Rehab Ask your health care provider which exercises are safe for you. Do exercises exactly as told by your provider and adjust them as directed. It is normal to feel mild stretching, pulling, tightness, or discomfort as you do these exercises. Stop right away if you feel sudden pain or your pain gets worse. Do not begin these exercises until told by your provider. Stretching and range-of-motion exercises These exercises warm up your muscles and joints. They can help improve the movement and flexibility of your arm and shoulder. They may also help to relieve pain and stiffness. Forearm rotation, supination  Stand up or sit with your left / right elbow bent in a 90-degree angle (right angle). Turn (rotate) your left / right palm up (supination) until you cannot rotate it anymore. Then, use your other hand to help turn your left / right forearm more. Hold this position for __________ seconds. Slowly return to the starting position. Repeat __________ times. Complete this exercise __________ times a day. Forearm rotation, pronation  Stand up or sit with your left / right elbow bent in a 90-degree angle (right angle). Turn (rotate) your left / right palm down (pronation) until you cannot rotate it anymore. Then, use your other hand to help turn your left / right forearm more. Hold this position for __________ seconds. Slowly return to the starting position. Elbow range of motion  Stand or sit with your left / right elbow bent in a 90-degree angle (right angle). Position your forearm so that the thumb is facing the ceiling (neutral position). Slowly bend your elbow as far as you can until you feel a stretch or cannot go any farther. Hold this position for __________ seconds. Slowly straighten your elbow as far as you can until you feel a stretch or cannot go any farther. Hold this position for __________ seconds. Repeat __________ times. Complete this  exercise __________ times a day. Biceps stretch  Stand facing a wall, or stand by a door frame. Raise your left / right arm out to your side, to your shoulder height. Place the thumb side of your hand against the wall. Your palm should be facing the floor (palm down). Keeping your arm straight, rotate your body in the opposite direction of the raised arm until you feel a gentle stretch in your biceps. Hold this position for __________ seconds. Slowly return to the starting position. Repeat __________ times. Complete this exercise __________ times a day. Shoulder pendulum  Stand near a table or counter that you can hold onto for balance. Bend forward at the waist and let your left / right arm hang straight down. Use your other arm to support you and help you stay balanced. Relax your left / right arm and shoulder muscles. Move your hips and your trunk so your left / right arm swings freely. Your arm should swing because of the motion of your body, not because you are using your arm or shoulder muscles. Keep moving your hips and trunk so your arm swings in the following directions, as told by your provider: Side to side. Forward and backward. In clockwise and counterclockwise circles. Repeat __________ times. Complete this exercise __________ times a day. Shoulder flexion, assisted  Stand facing a wall. Put your left / right palm on the wall. Slowly move your left / right hand up the wall (flexion). Stop when you feel a stretch in your shoulder, or when you reach the angle that is recommended by your provider.  Use your other hand to help raise your arm, if needed (assisted). As your hand gets higher, you may need to step closer to the wall. Avoid shrugging or lifting your shoulder up as you raise your arm. To do this, keep your shoulder blade tucked down toward your spine. Hold this position for __________ seconds. Slowly return to the starting position. Use your other arm to help, if  needed. Repeat __________ times. Complete this exercise __________ times a day. Shoulder flexion  Stand with your left / right arm hanging down at your side. Keep your arm straight as you lift your arm forward and toward the ceiling (flexion). Hold this position for __________ seconds. Slowly return to the starting position. Repeat __________ times. Complete this exercise __________ times a day. Sleeper stretch, assisted  Lie on your left / right side (injured side) with your hips and knees bent and your left / right arm straight in front of you. Bend your elbow to a 90-degree angle (right angle), so your fingers are pointing to the ceiling. Use your other hand to gently push your arm toward the floor (assisted), stopping when you feel a gentle stretch. Keep your shoulder blades lightly squeezed together during the exercise. Hold this position for __________ seconds. Slowly return to the starting position. Repeat __________ times. Complete this exercise __________ times a day. Strengthening exercises These exercises build strength and endurance in your arm and shoulder. Endurance is the ability to use your muscles for a long time, even after they get tired. Biceps curls You can use a weight or an exercise band for this exercise. Sit on a stable chair without armrests, or stand up. Hold a __________ lb / kg weight in your left / right hand, or hold an exercise band with both hands. Your palms should face up toward the ceiling at the starting position. Bend your left / right elbow and move your hand up toward your shoulder. Keep your other arm straight down, in the starting position. Hold this position for __________ seconds. Slowly return to the starting position. Repeat __________ times. Complete this exercise __________ times a day. Internal shoulder rotation You will use an exercise band secured to a stable object at waist height for this exercise. A door and doorframe work  well. Stand sideways next to a door with your left / right arm closest to the door, holding the exercise band in your hand. With your elbow bent in a 90-degree angle (right angle) and keeping your elbow at your side, bring your hand toward your belly (internal rotation). Make sure your wrist is staying straight as you do this exercise. Hold this position for __________ seconds. Slowly return to the starting position. Repeat __________ times. Complete this exercise __________ times a day. External shoulder rotation You will use an exercise band secured to a stable object at waist height for this exercise. A door and doorframe work well. Stand sideways next to a door with your left / right arm away from the door, holding the exercise band in your hand. With your elbow bent in a 90-degree angle (right angle) and keeping your elbow at your side, swing your arm away from your body (external rotation). Make sure your wrist is staying straight as you do this exercise. Hold this position for __________ seconds. Slowly return to the starting position. Repeat __________ times. Complete this exercise __________ times a day. External shoulder rotation, side-lying You will use a weight to do this exercise. Lie on your uninjured  side with your left / right arm at your side. Bend your elbow to a 90-degree angle (right angle). Hold a __________ lb / kg weight in your left / right hand. Keeping your elbow at your side, raise your arm toward the ceiling (external rotation). Make sure your wrist is staying straight as you do this exercise. Hold this position for __________ seconds. Slowly return to the starting position. Repeat __________ times. Complete this exercise __________ times a day. Scapular retraction Scapular retraction is the process of pulling the shoulder blades (scapulae) toward each other, and toward the spine. You will need an exercise band to do this exercise. Sit in a stable chair without  armrests, or stand up. Secure an exercise band to a stable object in front of you so the band is at shoulder height. Hold one end of the exercise band in each hand. Squeeze your shoulder blades together and move your elbows slightly behind you (retraction). Do not shrug your shoulders upward while you do this. Hold this position for __________ seconds. Slowly return to the starting position. Repeat __________ times. Complete this exercise __________ times a day. Scapular protraction, supine Scapular protraction is the process of moving your shoulder blades away from each other, and away from the spine, while you lie on your back (supine position). Lie on your back on a firm surface. Hold a __________ lb / kg weight in your left / right hand. Raise your left / right arm straight into the air so your hand is directly above your shoulder joint. Push the weight into the air so your shoulder (scapula) lifts off the surface that you are lying on. Think of trying to punch the ceiling by only moving your scapula forward (protraction). Do not move your head, neck, or back. Hold this position for __________ seconds. Slowly return to the starting position. Repeat __________ times. Complete this exercise __________ times a day. This information is not intended to replace advice given to you by your health care provider. Make sure you discuss any questions you have with your health care provider. Document Revised: 02/07/2022 Document Reviewed: 02/07/2022 Elsevier Patient Education  2023 [employer].

## 2022-10-28 NOTE — Progress Notes (Unsigned)
   Acute Office Visit  Subjective:     Patient ID: Ana King, female    DOB: [birth year], 72 y.o.   MRN: [id number]  Chief Complaint  Patient presents with   Arm Pain    HPI Patient is in today for right anterior shoulder pain for the last month. No injury. She was on vacation and using her arm more. She is keeping her young great grandchildren and having to lift more. She is using tylenol but not really helping. Not had anything like this before.   ROS See HPI.      Objective:    BP 136/76   Pulse 60   Ht 5\' 3"  (1.6 m)   Wt 203 lb (92.1 kg)   SpO2 99%   BMI 35.96 kg/m  BP Readings from Last 3 Encounters:  10/28/22 136/76  09/24/22 134/64  09/16/22 128/61   Wt Readings from Last 3 Encounters:  10/28/22 203 lb (92.1 kg)  09/16/22 199 lb 8 oz (90.5 kg)  09/12/22 203 lb (92.1 kg)      Physical Exam  Right shoulder:  No redness, swelling or warmth of right shoulder or bicep.  Pain with ROM and not able to abduct past 110 degrees Tenderness over anterior shoulder and into bicep Pain with resisted bicep maneuver and yeragson Strength 5/5 of right arm Some pain with external range of motion and none with internal range of motion.       Assessment & Plan:  Marland KitchenMarland KitchenAracelli was seen today for arm pain.  Diagnoses and all orders for this visit:  Acute pain of right shoulder -     meloxicam (MOBIC) 15 MG tablet; Take 1 tablet (15 mg total) by mouth daily.   PE appears like bicep tendonitis  Rest, ice, and exercises given Start mobic for the next 2 weeks If not improving follow up with Dr. Karie Schwalbe and will consider formal PT.   Tandy Gaw, PA-C

## 2022-10-29 ENCOUNTER — Encounter: Payer: Self-pay | Admitting: Physician Assistant

## 2022-11-04 ENCOUNTER — Other Ambulatory Visit: Payer: Self-pay | Admitting: Physician Assistant

## 2022-11-04 DIAGNOSIS — Z1231 Encounter for screening mammogram for malignant neoplasm of breast: Secondary | ICD-10-CM

## 2022-11-04 DIAGNOSIS — Z0000 Encounter for general adult medical examination without abnormal findings: Secondary | ICD-10-CM

## 2022-11-11 ENCOUNTER — Ambulatory Visit (INDEPENDENT_AMBULATORY_CARE_PROVIDER_SITE_OTHER): Payer: Medicare HMO | Admitting: Physician Assistant

## 2022-11-11 DIAGNOSIS — Z1231 Encounter for screening mammogram for malignant neoplasm of breast: Secondary | ICD-10-CM

## 2022-11-11 DIAGNOSIS — Z0000 Encounter for general adult medical examination without abnormal findings: Secondary | ICD-10-CM | POA: Diagnosis not present

## 2022-11-11 NOTE — Progress Notes (Signed)
MEDICARE ANNUAL WELLNESS VISIT  11/11/2022  Telephone Visit Disclaimer This Medicare AWV was conducted by telephone due to national recommendations for restrictions regarding the COVID-19 Pandemic (e.g. social distancing).  I verified, using two identifiers, that I am speaking with Ana King or their authorized healthcare agent. I discussed the limitations, risks, security, and privacy concerns of performing an evaluation and management service by telephone and the potential availability of an in-person appointment in the future. The patient expressed understanding and agreed to proceed.  Location of Patient: home Location of Provider (nurse):  In the office  Subjective:    Ana King is a 72 y.o. female patient of Ana Longs, PA-C who had a [employer] Visit today via telephone. Ana King is Retired and lives with their spouse. she has 2 children. she reports that she is socially active and does interact with friends/family regularly. she is minimally physically active and enjoys playing bingo and spending time with grand kids.  Patient Care Team: Nolene Ebbs as PCP - General (Family Medicine) Gabriel Carina, [facility] as Pharmacist (Pharmacist)     11/11/2022    1:14 PM 10/03/2022    9:35 AM 11/08/2021    1:35 PM 08/27/2020    1:56 PM 05/03/2018    3:43 PM 11/02/2013    3:24 PM  Advanced Directives  Does Patient Have a Medical Advance Directive? No No No No No Patient does not have advance directive;Patient would like information  Would patient like information on creating a medical advance directive? No - Patient declined No - Patient declined No - Patient declined No - Patient declined Yes (MAU/Ambulatory/Procedural Areas - Information given)     Hospital Utilization Over the Past 12 Months: # of hospitalizations or ER visits: 0 # of surgeries: 0  Review of Systems    Patient reports that her overall health is unchanged compared to last  year.  History obtained from chart review and the patient  Patient Reported Readings (BP, Pulse, CBG, Weight, etc) none  Pain Assessment Pain : No/denies pain     Current Medications & Allergies (verified) Allergies as of 11/11/2022       Reactions   Penicillins Hives        Medication List        Accurate as of November 11, 2022  1:22 PM. If you have any questions, ask your nurse or doctor.          albuterol (2.5 MG/3ML) 0.083% nebulizer solution Commonly known as: PROVENTIL Take 3 mLs (2.5 mg total) by nebulization every 6 (six) hours as needed for wheezing or shortness of breath.   albuterol 108 (90 Base) MCG/ACT inhaler Commonly known as: VENTOLIN HFA Inhale 2 puffs into the lungs every 6 (six) hours as needed for wheezing or shortness of breath.   ascorbic acid 500 MG tablet Commonly known as: VITAMIN C Take 500 mg by mouth daily.   atorvastatin 40 MG tablet Commonly known as: LIPITOR Take 1 tablet (40 mg total) by mouth daily.   azelastine 0.1 % nasal spray Commonly known as: ASTELIN Place 2 sprays into both nostrils 2 (two) times daily. Use in each nostril as directed   CALCIUM 600 PO Take 1 tablet by mouth in the morning and at bedtime.   cholecalciferol 25 MCG (1000 UNIT) tablet Commonly known as: VITAMIN D3 Take 1,000 Units by mouth daily.   fexofenadine 180 MG tablet Commonly known as: Allegra Allergy Take 1 tablet (180 mg total)  by mouth daily.   hydrOXYzine 25 MG capsule Commonly known as: VISTARIL Take 1 capsule (25 mg total) by mouth every 8 (eight) hours as needed.   ipratropium-albuterol 0.5-2.5 (3) MG/3ML Soln Commonly known as: DUONEB Take 3 mLs by nebulization every 2 (two) hours as needed (wheeze, SOB).   lansoprazole 30 MG capsule Commonly known as: PREVACID TAKE 1 CAPSULE EVERY DAY AT 12 NOON   lisinopril 20 MG tablet Commonly known as: ZESTRIL Take 10 mg by mouth daily.   lisinopril 10 MG tablet Commonly known as:  ZESTRIL Take 1 tablet (10 mg total) by mouth daily.   meclizine 25 MG tablet Commonly known as: ANTIVERT Take 1 tablet (25 mg total) by mouth 3 (three) times daily as needed for dizziness.   meloxicam 15 MG tablet Commonly known as: MOBIC Take 1 tablet (15 mg total) by mouth daily.   montelukast 10 MG tablet Commonly known as: SINGULAIR TAKE 1 TABLET AT BEDTIME   ondansetron 8 MG tablet Commonly known as: Zofran Take 1 tablet (8 mg total) by mouth every 8 (eight) hours as needed for nausea or vomiting.   rizatriptan 10 MG disintegrating tablet Commonly known as: Maxalt-MLT Take 1 tablet (10 mg total) by mouth as needed for migraine. May repeat in 2 hours if needed   Trelegy Ellipta 100-62.5-25 MCG/ACT Aepb Generic drug: Fluticasone-Umeclidin-Vilant Inhale 1 puff into the lungs daily.        History (reviewed): Past Medical History:  Diagnosis Date   Asthma    Essential hypertension, benign 08/28/2015   GERD (gastroesophageal reflux disease)    Past Surgical History:  Procedure Laterality Date   ABDOMINAL HYSTERECTOMY     Family History  Problem Relation Age of Onset   Cancer Mother    Diabetes Maternal Aunt    Hypertension Maternal Aunt    Cancer Maternal Grandmother    Diabetes Maternal Grandmother    Social History   Socioeconomic History   Marital status: Married    Spouse name: Greggory Stallion   Number of children: 2   Years of education: 10th   Highest education level: 10th grade  Occupational History   Occupation: retired    Comment: housewife  Tobacco Use   Smoking status: Former    Types: Cigarettes    Quit date: 05/21/2014    Years since quitting: 8.4   Smokeless tobacco: Never  Vaping Use   Vaping Use: Never used  Substance and Sexual Activity   Alcohol use: Never    Alcohol/week: 0.0 standard drinks of alcohol   Drug use: Never   Sexual activity: Not Currently  Other Topics Concern   Not on file  Social History Narrative   Lives with her  husband. She has two children. She enjoys playing bingo and spending time with her grand kids.   Social Determinants of Health   Financial Resource Strain: Low Risk  (11/11/2022)   Overall Financial Resource Strain (CARDIA)    Difficulty of Paying Living Expenses: Not hard at all  Food Insecurity: No Food Insecurity (11/11/2022)   Hunger Vital Sign    Worried About Running Out of Food in the Last Year: Never true    Ran Out of Food in the Last Year: Never true  Transportation Needs: No Transportation Needs (11/11/2022)   PRAPARE - Administrator, Civil Service (Medical): No    Lack of Transportation (Non-Medical): No  Physical Activity: Inactive (11/11/2022)   Exercise Vital Sign    Days of Exercise per  Week: 0 days    Minutes of Exercise per Session: 0 min  Stress: No Stress Concern Present (11/11/2022)   [employer] of Occupational Health - Occupational Stress Questionnaire    Feeling of Stress : Not at all  Social Connections: Moderately Integrated (11/11/2022)   Social Connection and Isolation Panel [NHANES]    Frequency of Communication with Friends and Family: More than three times a week    Frequency of Social Gatherings with Friends and Family: Once a week    Attends Religious Services: More than 4 times per year    Active Member of [employer] or Organizations: No    Attends Banker Meetings: Never    Marital Status: Married    Activities of Daily Living    11/11/2022    1:17 PM  In your present state of health, do you have any difficulty performing the following activities:  Hearing? 0  Vision? 0  Difficulty concentrating or making decisions? 0  Walking or climbing stairs? 0  Dressing or bathing? 0  Doing errands, shopping? 0  Preparing Food and eating ? N  Using the Toilet? N  In the past six months, have you accidently leaked urine? Y  Do you have problems with loss of bowel control? N  Managing your Medications? N  Managing your Finances?  N  Housekeeping or managing your Housekeeping? N    Patient Education/ Literacy How often do you need to have someone help you when you read instructions, pamphlets, or other written materials from your doctor or pharmacy?: 1 - Never What is the last grade level you completed in school?: 11th grade  Exercise Current Exercise Habits: The patient does not participate in regular exercise at present, Exercise limited by: None identified  Diet Patient reports consuming 2 meals a day and 1 snack(s) a day Patient reports that her primary diet is: Regular Patient reports that she does have regular access to food.   Depression Screen    11/11/2022    1:14 PM 10/28/2022   10:10 AM 09/03/2022    2:48 PM 08/04/2022    4:31 PM 07/07/2022    2:11 PM 05/12/2022    2:42 PM 03/26/2022    2:01 PM  PHQ 2/9 Scores  PHQ - 2 Score 0 0 0 0 0 0 0     Fall Risk    11/11/2022    1:14 PM 10/28/2022   10:10 AM 09/16/2022    2:47 PM 09/03/2022    2:48 PM 08/04/2022    4:31 PM  Fall Risk   Falls in the past year? 1 0 1 0 0  Number falls in past yr: 0 0 0 0 0  Injury with Fall? 1 0 1 0 0  Risk for fall due to : History of fall(s)  History of fall(s) No Fall Risks No Fall Risks  Follow up Falls evaluation completed;Education provided  Falls evaluation completed Falls evaluation completed Falls evaluation completed     Objective:  Nakenya Theall seemed alert and oriented and she participated appropriately during our telephone visit.  Blood Pressure Weight BMI  BP Readings from Last 3 Encounters:  10/28/22 136/76  09/24/22 134/64  09/16/22 128/61   Wt Readings from Last 3 Encounters:  10/28/22 203 lb (92.1 kg)  09/16/22 199 lb 8 oz (90.5 kg)  09/12/22 203 lb (92.1 kg)   BMI Readings from Last 1 Encounters:  10/28/22 35.96 kg/m    *Unable to obtain current vital signs, weight, and BMI  due to telephone visit type  Hearing/Vision  Shondell did not seem to have difficulty with hearing/understanding  during the telephone conversation Reports that she has had a formal eye exam by an eye care professional within the past year Reports that she has not had a formal hearing evaluation within the past year *Unable to fully assess hearing and vision during telephone visit type  Cognitive Function:    11/11/2022    1:18 PM 11/08/2021    1:41 PM 08/27/2020    2:06 PM 05/03/2018    3:50 PM  6CIT Screen  What Year? 0 points 0 points 0 points 0 points  What month? 0 points 0 points 0 points 0 points  What time? 0 points 0 points 0 points 0 points  Count back from 20 0 points  0 points 0 points  Months in reverse 0 points  0 points 0 points  Repeat phrase 0 points  0 points 0 points  Total Score 0 points  0 points 0 points   (Normal:0-7, Significant for Dysfunction: >8)  Normal Cognitive Function Screening: Yes   Immunization & Health Maintenance Record Immunization History  Administered Date(s) Administered   Fluad Quad(high Dose 65+) 03/18/2019, 05/01/2020, 04/15/2021, 05/30/2022   Influenza,inj,Quad PF,6+ Mos 05/24/2014, 05/07/2015, 05/13/2016, 03/24/2017, 05/03/2018   PFIZER(Purple Top)SARS-COV-2 Vaccination 03/07/2020, 04/28/2020   Pneumococcal Conjugate-13 03/24/2017   Pneumococcal Polysaccharide-23 05/03/2018   Tdap 03/02/2018   Zoster Recombinat (Shingrix) 10/20/2019, 05/01/2020    Health Maintenance  Topic Date Due   COVID-19 Vaccine (3 - Pfizer risk series) 11/13/2022 (Originally 05/26/2020)   COLONOSCOPY (Pts 45-10yrs Insurance coverage will need to be confirmed)  03/27/2023 (Originally 01/05/1997)   MAMMOGRAM  11/11/2023 (Originally 03/17/2020)   INFLUENZA VACCINE  02/19/2023   Lung Cancer Screening  05/14/2023   Medicare Annual Wellness (AWV)  11/11/2023   DTaP/Tdap/Td (2 - Td or Tdap) 03/02/2028   Pneumonia Vaccine 53+ Years old  Completed   DEXA SCAN  Completed   Hepatitis C Screening  Completed   Zoster Vaccines- Shingrix  Completed   HPV VACCINES  Aged Out        Assessment  This is a routine wellness examination for [employer].  Health Maintenance: Due or Overdue There are no preventive care reminders to display for this patient.   Ana King does not need a referral for Community Assistance: Care Management:   no Social Work:    no Prescription Assistance:  no Nutrition/Diabetes Education:  no   Plan:  Personalized Goals  Goals Addressed               This Visit's Progress     Patient Stated (pt-stated)        Patient stated she would like to work on losing weight.       Personalized Health Maintenance & Screening Recommendations  Screening mammography  Lung Cancer Screening Recommended: yes; scheduled (Low Dose CT Chest recommended if Age 26-80 years, 30 pack-year currently smoking OR have quit w/in past 15 years) Hepatitis C Screening recommended: no HIV Screening recommended: no  Advanced Directives: Written information was not prepared per patient's request.  Referrals & Orders Orders Placed This Encounter  Procedures   Mammogram 3D SCREEN BREAST BILATERAL    Follow-up Plan Follow-up with Ana Longs, PA-C as planned Medicare wellness visit in one year.  Patient will access AVS on my chart.   I have personally reviewed and noted the following in the patient's chart:   Medical and social history  Use of alcohol, tobacco or illicit drugs  Current medications and supplements Functional ability and status Nutritional status Physical activity Advanced directives List of other physicians Hospitalizations, surgeries, and ER visits in previous 12 months Vitals Screenings to include cognitive, depression, and falls Referrals and appointments  In addition, I have reviewed and discussed with Ana King certain preventive protocols, quality metrics, and best practice recommendations. A written personalized care plan for preventive services as well as general preventive health recommendations is  available and can be mailed to the patient at her request.      Modesto Charon, RN BSN  11/11/2022

## 2022-11-11 NOTE — Patient Instructions (Addendum)
MEDICARE ANNUAL WELLNESS VISIT Health Maintenance Summary and Written Plan of Care  Ana King ,  Thank you for allowing me to perform your Medicare Annual Wellness Visit and for your ongoing commitment to your health.   Health Maintenance & Immunization History Health Maintenance  Topic Date Due   COVID-19 Vaccine (3 - Pfizer risk series) 11/13/2022 (Originally 05/26/2020)   COLONOSCOPY (Pts 45-37yrs Insurance coverage will need to be confirmed)  03/27/2023 (Originally 01/05/1997)   MAMMOGRAM  11/11/2023 (Originally 03/17/2020)   INFLUENZA VACCINE  02/19/2023   Lung Cancer Screening  05/14/2023   Medicare Annual Wellness (AWV)  11/11/2023   DTaP/Tdap/Td (2 - Td or Tdap) 03/02/2028   Pneumonia Vaccine 13+ Years old  Completed   DEXA SCAN  Completed   Hepatitis C Screening  Completed   Zoster Vaccines- Shingrix  Completed   HPV VACCINES  Aged Out   Immunization History  Administered Date(s) Administered   Fluad Quad(high Dose 65+) 03/18/2019, 05/01/2020, 04/15/2021, 05/30/2022   Influenza,inj,Quad PF,6+ Mos 05/24/2014, 05/07/2015, 05/13/2016, 03/24/2017, 05/03/2018   PFIZER(Purple Top)SARS-COV-2 Vaccination 03/07/2020, 04/28/2020   Pneumococcal Conjugate-13 03/24/2017   Pneumococcal Polysaccharide-23 05/03/2018   Tdap 03/02/2018   Zoster Recombinat (Shingrix) 10/20/2019, 05/01/2020    These are the patient goals that we discussed:  Goals Addressed               This Visit's Progress     Patient Stated (pt-stated)        Patient stated she would like to work on losing weight.         This is a list of Health Maintenance Items that are overdue or due now: Screening mammography    Orders/Referrals Placed Today: Orders Placed This Encounter  Procedures   Mammogram 3D SCREEN BREAST BILATERAL    Standing Status:   Future    Standing Expiration Date:   11/11/2023    Scheduling Instructions:     Please call patient to schedule.    Order Specific Question:   Reason  for Exam (SYMPTOM  OR DIAGNOSIS REQUIRED)    Answer:   breast cancer screening    Order Specific Question:   Preferred imaging location?    Answer:   [name]   (Contact our referral department at [phone] if you have not spoken with someone about your referral appointment within the next 5 days)    Follow-up Plan Follow-up with [name], PA-C as planned Medicare wellness visit in one year.  Patient will access AVS on my chart.      Health Maintenance, Female Adopting a healthy lifestyle and getting preventive care are important in promoting health and wellness. Ask your health care provider about: The right schedule for you to have regular tests and exams. Things you can do on your own to prevent diseases and keep yourself healthy. What should I know about diet, weight, and exercise? Eat a healthy diet  Eat a diet that includes plenty of vegetables, fruits, low-fat dairy products, and lean protein. Do not eat a lot of foods that are high in solid fats, added sugars, or sodium. Maintain a healthy weight Body mass index (BMI) is used to identify weight problems. It estimates body fat based on height and weight. Your health care provider can help determine your BMI and help you achieve or maintain a healthy weight. Get regular exercise Get regular exercise. This is one of the most important things you can do for your health. Most adults should: Exercise for at least  150 minutes each week. The exercise should increase your heart rate and make you sweat (moderate-intensity exercise). Do strengthening exercises at least twice a week. This is in addition to the moderate-intensity exercise. Spend less time sitting. Even light physical activity can be beneficial. Watch cholesterol and blood lipids Have your blood tested for lipids and cholesterol at 71 years of age, then have this test every 5 years. Have your cholesterol levels checked more often if: Your lipid  or cholesterol levels are high. You are older than 71 years of age. You are at high risk for heart disease. What should I know about cancer screening? Depending on your health history and family history, you may need to have cancer screening at various ages. This may include screening for: Breast cancer. Cervical cancer. Colorectal cancer. Skin cancer. Lung cancer. What should I know about heart disease, diabetes, and high blood pressure? Blood pressure and heart disease High blood pressure causes heart disease and increases the risk of stroke. This is more likely to develop in people who have high blood pressure readings or are overweight. Have your blood pressure checked: Every 3-5 years if you are 61-46 years of age. Every year if you are 34 years old or older. Diabetes Have regular diabetes screenings. This checks your fasting blood sugar level. Have the screening done: Once every three years after age 43 if you are at a normal weight and have a low risk for diabetes. More often and at a younger age if you are overweight or have a high risk for diabetes. What should I know about preventing infection? Hepatitis B If you have a higher risk for hepatitis B, you should be screened for this virus. Talk with your health care provider to find out if you are at risk for hepatitis B infection. Hepatitis C Testing is recommended for: Everyone born from [birth year] through 1964. Anyone with known risk factors for hepatitis C. Sexually transmitted infections (STIs) Get screened for STIs, including gonorrhea and chlamydia, if: You are sexually active and are younger than 71 years of age. You are older than 71 years of age and your health care provider tells you that you are at risk for this type of infection. Your sexual activity has changed since you were last screened, and you are at increased risk for chlamydia or gonorrhea. Ask your health care provider if you are at risk. Ask your health care  provider about whether you are at high risk for HIV. Your health care provider may recommend a prescription medicine to help prevent HIV infection. If you choose to take medicine to prevent HIV, you should first get tested for HIV. You should then be tested every 3 months for as long as you are taking the medicine. Pregnancy If you are about to stop having your period (premenopausal) and you may become pregnant, seek counseling before you get pregnant. Take 400 to 800 micrograms (mcg) of folic acid every day if you become pregnant. Ask for birth control (contraception) if you want to prevent pregnancy. Osteoporosis and menopause Osteoporosis is a disease in which the bones lose minerals and strength with aging. This can result in bone fractures. If you are 84 years old or older, or if you are at risk for osteoporosis and fractures, ask your health care provider if you should: Be screened for bone loss. Take a calcium or vitamin D supplement to lower your risk of fractures. Be given hormone replacement therapy (HRT) to treat symptoms of menopause. Follow these  instructions at home: Alcohol use Do not drink alcohol if: Your health care provider tells you not to drink. You are pregnant, may be pregnant, or are planning to become pregnant. If you drink alcohol: Limit how much you have to: 0-1 drink a day. Know how much alcohol is in your drink. In the [place] one drink equals one 12 oz bottle of beer (355 mL), one 5 oz glass of wine (148 mL), or one 1 oz glass of hard liquor (44 mL). Lifestyle Do not use any products that contain nicotine or tobacco. These products include cigarettes, chewing tobacco, and vaping devices, such as e-cigarettes. If you need help quitting, ask your health care provider. Do not use street drugs. Do not share needles. Ask your health care provider for help if you need support or information about quitting drugs. General instructions Schedule regular health, dental, and  eye exams. Stay current with your vaccines. Tell your health care provider if: You often feel depressed. You have ever been abused or do not feel safe at home. Summary Adopting a healthy lifestyle and getting preventive care are important in promoting health and wellness. Follow your health care provider's instructions about healthy diet, exercising, and getting tested or screened for diseases. Follow your health care provider's instructions on monitoring your cholesterol and blood pressure. This information is not intended to replace advice given to you by your health care provider. Make sure you discuss any questions you have with your health care provider. Document Revised: 11/26/2020 Document Reviewed: 11/26/2020 Elsevier Patient Education  2023 [employer].

## 2022-11-12 ENCOUNTER — Other Ambulatory Visit: Payer: Self-pay | Admitting: Physician Assistant

## 2022-11-12 DIAGNOSIS — R600 Localized edema: Secondary | ICD-10-CM

## 2022-12-12 ENCOUNTER — Ambulatory Visit (INDEPENDENT_AMBULATORY_CARE_PROVIDER_SITE_OTHER): Payer: Medicare HMO | Admitting: Family Medicine

## 2022-12-12 ENCOUNTER — Ambulatory Visit (INDEPENDENT_AMBULATORY_CARE_PROVIDER_SITE_OTHER): Payer: Medicare HMO

## 2022-12-12 ENCOUNTER — Encounter: Payer: Self-pay | Admitting: Family Medicine

## 2022-12-12 VITALS — BP 113/73 | HR 75 | Ht 63.0 in | Wt 201.0 lb

## 2022-12-12 DIAGNOSIS — M79675 Pain in left toe(s): Secondary | ICD-10-CM | POA: Diagnosis not present

## 2022-12-12 MED ORDER — HYDROCODONE-ACETAMINOPHEN 5-325 MG PO TABS: 1.0000 | ORAL_TABLET | Freq: Four times a day (QID) | ORAL | 0 refills | Status: DC | PRN

## 2022-12-12 NOTE — Assessment & Plan Note (Addendum)
-   swelling and tenderness to palpation of left great toe - will get xray, concerning for fracture - have given patient rigid sole shoe - given medication for pain, pmp verified and no red flags - recommended elevation and ice

## 2022-12-12 NOTE — Progress Notes (Signed)
   Acute Office Visit  Subjective:     Patient ID: Ana King, female    DOB: 13-Feb-1951, 72 y.o.   MRN: [id number]  Chief Complaint  Patient presents with   Fall         HPI Patient is in today for acute visit. She had a fall five days ago and is having left foot pain and swelling. She says her great toe got caught under a mat. She says anytime she puts weight on the great toe it hurts. She has limited weight bearing. She has been using epsom salt baths.   Review of Systems  Constitutional:  Negative for chills and fever.  Respiratory:  Negative for cough and shortness of breath.   Cardiovascular:  Negative for chest pain.  Musculoskeletal:        Left foot pain  Neurological:  Negative for headaches.        Objective:    BP 113/73   Pulse 75   Ht 5\' 3"  (1.6 m)   Wt 201 lb (91.2 kg)   SpO2 95%   BMI 35.61 kg/m    Physical Exam Vitals and nursing note reviewed.  Constitutional:      General: She is not in acute distress.    Appearance: Normal appearance.  HENT:     Head: Normocephalic and atraumatic.     Right Ear: External ear normal.     Left Ear: External ear normal.     Nose: Nose normal.  Eyes:     Conjunctiva/sclera: Conjunctivae normal.  Cardiovascular:     Rate and Rhythm: Normal rate and regular rhythm.  Pulmonary:     Effort: Pulmonary effort is normal.     Breath sounds: Normal breath sounds.  Neurological:     General: No focal deficit present.     Mental Status: She is alert and oriented to person, place, and time.  Psychiatric:        Mood and Affect: Mood normal.        Behavior: Behavior normal.        Thought Content: Thought content normal.        Judgment: Judgment normal.     No results found for any visits on 12/12/22.      Assessment & Plan:   Problem List Items Addressed This Visit       Other   Pain of great toe, left - Primary    - swelling and tenderness to palpation of left great toe - will get xray,  concerning for fracture - have given patient rigid sole shoe - given medication for pain, pmp verified and no red flags - recommended elevation and ice      Relevant Orders   DG Foot Complete Left    Meds ordered this encounter  Medications   HYDROcodone-acetaminophen (NORCO) 5-325 MG tablet    Sig: Take 1 tablet by mouth every 6 (six) hours as needed for moderate pain.    Dispense:  30 tablet    Refill:  0    Return in about 5 days (around 12/17/2022).  Charlton Amor, DO

## 2022-12-12 NOTE — Patient Instructions (Signed)
Elevated foot Use ice  Take norco 5mg  every 6 hours

## 2022-12-17 ENCOUNTER — Ambulatory Visit: Payer: Medicare HMO | Admitting: Family Medicine

## 2022-12-18 ENCOUNTER — Other Ambulatory Visit: Payer: Self-pay | Admitting: Physician Assistant

## 2022-12-18 DIAGNOSIS — J4521 Mild intermittent asthma with (acute) exacerbation: Secondary | ICD-10-CM

## 2022-12-26 ENCOUNTER — Ambulatory Visit (INDEPENDENT_AMBULATORY_CARE_PROVIDER_SITE_OTHER): Payer: Medicare HMO | Admitting: Physician Assistant

## 2022-12-26 ENCOUNTER — Encounter: Payer: Self-pay | Admitting: Physician Assistant

## 2022-12-26 VITALS — BP 116/68 | HR 64 | Ht 63.0 in | Wt 197.0 lb

## 2022-12-26 DIAGNOSIS — I10 Essential (primary) hypertension: Secondary | ICD-10-CM

## 2022-12-26 MED ORDER — LISINOPRIL 10 MG PO TABS: 10.0000 mg | ORAL_TABLET | Freq: Every day | ORAL | 1 refills | Status: DC

## 2022-12-26 NOTE — Progress Notes (Signed)
Established Patient Office Visit  Subjective   Patient ID: Ana King, female    DOB: Aug 23, 1950  Age: 72 y.o. MRN: [id number]  Chief Complaint  Patient presents with   Hypertension    HPI Pt is a 72 yo female with HTN who presents to the clinic for follow up. Not checking BP at home. Denies any CP, palpitations, headaches or vision changes. No lower ext swelling. Taking lisinopril 10mg  daily.   .. 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   Reactive airway disease 06/09/2017   Former smoker 06/09/2017   Chronic venous stasis 12/22/2017   Muscle cramp 12/22/2017   Edema of left ankle 12/22/2017   Osteopenia 03/17/2018   SOB (shortness of breath) on exertion 01/11/2019   Depressed mood 01/11/2019   Right shoulder pain 04/12/2019   Cervical spondylosis 05/10/2019   Non-restorative sleep 08/17/2019   Snoring 08/17/2019   Class 2 obesity due to excess calories without serious comorbidity with body mass index (BMI) of 36.0 to 36.9 in adult 08/17/2019   Hyperlipidemia 08/18/2019   OSA (obstructive sleep apnea) 09/20/2019   Diarrhea 11/08/2019   Plantar fasciitis, left 01/24/2020   Acute intractable headache 01/31/2020   Vision changes 01/31/2020   Non-intractable vomiting 01/31/2020   Eye pain, right 01/31/2020   Herpes zoster with ophthalmic complication 02/03/2020   Hyponatremia 02/20/2020   Hypocalcemia 02/20/2020   Low hemoglobin 02/20/2020   AKI (acute kidney injury) (HCC) 02/21/2020   CKD (chronic kidney disease) stage 3, GFR 30-59 ml/min (HCC) 02/20/2021   Grade I diastolic dysfunction 02/26/2021   Coronary artery calcification seen on CT scan 04/17/2021   Aortic atherosclerosis (HCC) 04/17/2021   Centrilobular emphysema (HCC) 04/17/2021   Acute  pain of left shoulder 06/03/2021   Bilateral leg edema 07/30/2021   Left ear impacted cerumen 07/30/2021   Vertigo 07/30/2021   Injury of right leg 10/14/2021   Seasonal allergic rhinitis due to pollen 12/24/2021   Seasonal allergies 12/24/2021   Decreased GFR 12/24/2021   Migraine without aura and without status migrainosus, not intractable 03/28/2022   Chronic allergic rhinitis 07/08/2022   Pruritus 08/05/2022   Great toe pain, right 09/03/2022   Gout 09/05/2022   Pain of great toe, left 12/12/2022   Resolved Ambulatory Problems    Diagnosis Date Noted   Tobacco abuse 11/02/2013   COPD (chronic obstructive pulmonary disease) (HCC) 11/02/2013   Past Medical History:  Diagnosis Date   Asthma      Review of Systems  All other systems reviewed and are negative.     Objective:     BP 116/68 (BP Location: Left Arm, Patient Position: Sitting, Cuff Size: Normal)   Pulse 64   Ht 5\' 3"  (1.6 m)   Wt 197 lb (89.4 kg)   SpO2 97%   BMI 34.90 kg/m  BP Readings from Last 3 Encounters:  12/26/22 116/68  12/12/22 113/73  10/28/22 136/76   Wt Readings from Last 3 Encounters:  12/26/22 197 lb (89.4 kg)  12/12/22 201 lb (91.2 kg)  10/28/22 203 lb (92.1 kg)      Physical Exam Constitutional:      Appearance: Normal appearance. She is obese.  Cardiovascular:     Rate and Rhythm: Normal rate and regular rhythm.     Pulses: Normal pulses.  Heart sounds: Normal heart sounds.  Pulmonary:     Effort: Pulmonary effort is normal.     Breath sounds: Normal breath sounds.  Musculoskeletal:     Right lower leg: No edema.     Left lower leg: No edema.  Neurological:     General: No focal deficit present.     Mental Status: She is alert and oriented to person, place, and time.  Psychiatric:        Mood and Affect: Mood normal.        Assessment & Plan:  Marland KitchenMarland KitchenPhilisha was seen today for hypertension.  Diagnoses and all orders for this visit:  Essential hypertension,  benign -     lisinopril (ZESTRIL) 10 MG tablet; Take 1 tablet (10 mg total) by mouth daily.   Vitals look great.  Labs UTD.  Refilled lisinopril 10mg  daily  Return in about 6 months (around 06/27/2023).    Tandy Gaw, PA-C

## 2022-12-30 ENCOUNTER — Other Ambulatory Visit: Payer: Self-pay | Admitting: Physician Assistant

## 2022-12-30 DIAGNOSIS — I10 Essential (primary) hypertension: Secondary | ICD-10-CM

## 2022-12-31 ENCOUNTER — Ambulatory Visit: Payer: Medicare HMO

## 2023-02-04 ENCOUNTER — Encounter: Payer: Self-pay | Admitting: Physician Assistant

## 2023-02-04 ENCOUNTER — Ambulatory Visit (INDEPENDENT_AMBULATORY_CARE_PROVIDER_SITE_OTHER): Payer: Medicare HMO | Admitting: Physician Assistant

## 2023-02-04 VITALS — BP 123/60 | HR 79 | Wt 198.1 lb

## 2023-02-04 DIAGNOSIS — R0602 Shortness of breath: Secondary | ICD-10-CM | POA: Diagnosis not present

## 2023-02-04 DIAGNOSIS — J449 Chronic obstructive pulmonary disease, unspecified: Secondary | ICD-10-CM

## 2023-02-04 DIAGNOSIS — J441 Chronic obstructive pulmonary disease with (acute) exacerbation: Secondary | ICD-10-CM

## 2023-02-04 DIAGNOSIS — J302 Other seasonal allergic rhinitis: Secondary | ICD-10-CM | POA: Diagnosis not present

## 2023-02-04 DIAGNOSIS — G4733 Obstructive sleep apnea (adult) (pediatric): Secondary | ICD-10-CM

## 2023-02-04 DIAGNOSIS — J4521 Mild intermittent asthma with (acute) exacerbation: Secondary | ICD-10-CM | POA: Insufficient documentation

## 2023-02-04 MED ORDER — PREDNISONE 20 MG PO TABS
ORAL_TABLET | ORAL | 0 refills | Status: DC
Start: 2023-02-04 — End: 2023-02-10

## 2023-02-04 NOTE — Progress Notes (Signed)
Acute Office Visit  Subjective:     Patient ID: Ana King, female    DOB: 1951-01-16, 72 y.o.   MRN: [id number]  Chief Complaint  Patient presents with   Follow-up    HPI Patient is in today for SOB and chest tightness for 2 days. She has COPD more inflammatory asthma based. She was doing great in [place] and then on her way back home about 1 hour away started having chest tightness and SOB. No cough. No fever, chills, body aches, viral symptoms. Her albuterol and duoneb help a lot but she is using them every 2-4 hours. Using trelegy daily.  .. 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   Reactive airway disease 06/09/2017   Former smoker 06/09/2017   Chronic venous stasis 12/22/2017   Muscle cramp 12/22/2017   Edema of left ankle 12/22/2017   Osteopenia 03/17/2018   SOB (shortness of breath) on exertion 01/11/2019   Depressed mood 01/11/2019   Right shoulder pain 04/12/2019   Cervical spondylosis 05/10/2019   Non-restorative sleep 08/17/2019   Snoring 08/17/2019   Class 2 obesity due to excess calories without serious comorbidity with body mass index (BMI) of 36.0 to 36.9 in adult 08/17/2019   Hyperlipidemia 08/18/2019   OSA (obstructive sleep apnea) 09/20/2019   Diarrhea 11/08/2019   Plantar fasciitis, left 01/24/2020   Acute intractable headache 01/31/2020   Vision changes 01/31/2020   Non-intractable vomiting 01/31/2020   Eye pain, right 01/31/2020   Herpes zoster with ophthalmic complication 02/03/2020   Hyponatremia 02/20/2020   Hypocalcemia 02/20/2020   Low hemoglobin 02/20/2020   AKI (acute kidney injury) (HCC) 02/21/2020   CKD (chronic kidney disease) stage 3, GFR 30-59 ml/min (HCC) 02/20/2021   Grade I diastolic dysfunction 02/26/2021   Coronary  artery calcification seen on CT scan 04/17/2021   Aortic atherosclerosis (HCC) 04/17/2021   Centrilobular emphysema (HCC) 04/17/2021   Acute pain of left shoulder 06/03/2021   Bilateral leg edema 07/30/2021   Left ear impacted cerumen 07/30/2021   Vertigo 07/30/2021   Injury of right leg 10/14/2021   Seasonal allergic rhinitis due to pollen 12/24/2021   Seasonal allergies 12/24/2021   Decreased GFR 12/24/2021   Migraine without aura and without status migrainosus, not intractable 03/28/2022   Chronic allergic rhinitis 07/08/2022   Pruritus 08/05/2022   Great toe pain, right 09/03/2022   Gout 09/05/2022   Pain of great toe, left 12/12/2022   Mild intermittent asthma with exacerbation 02/04/2023   Resolved Ambulatory Problems    Diagnosis Date Noted   Tobacco abuse 11/02/2013   COPD (chronic obstructive pulmonary disease) (HCC) 11/02/2013   Past Medical History:  Diagnosis Date   Asthma      ROS  See HPI.     Objective:    BP 123/60 (BP Location: Left Arm, Patient Position: Sitting)   Pulse 79   Wt 198 lb 1.9 oz (89.9 kg)   SpO2 96%   BMI 35.10 kg/m  BP Readings from Last 3 Encounters:  02/04/23 123/60  12/26/22 116/68  12/12/22 113/73   Wt Readings from Last 3 Encounters:  02/04/23 198 lb 1.9 oz (89.9 kg)  12/26/22 197 lb (89.4 kg)  12/12/22 201 lb (91.2 kg)      Physical Exam Constitutional:      Appearance:  Normal appearance.  HENT:     Head: Normocephalic.  Cardiovascular:     Rate and Rhythm: Normal rate and regular rhythm.     Pulses: Normal pulses.  Pulmonary:     Comments: Some labored appearance with scant upper lung wheezing. No rhonchi or rales.  Abdominal:     Palpations: Abdomen is soft.  Musculoskeletal:     Cervical back: Normal range of motion and neck supple. No tenderness.  Lymphadenopathy:     Cervical: No cervical adenopathy.  Neurological:     General: No focal deficit present.     Mental Status: She is alert and oriented to  person, place, and time.  Psychiatric:        Mood and Affect: Mood normal.        Assessment & Plan:  Marland KitchenMarland KitchenKorynn was seen today for follow-up.  Diagnoses and all orders for this visit:  COPD exacerbation (HCC) -     predniSONE (DELTASONE) 20 MG tablet; Take 3 tablets for 3 days, take 2 tablets for 3 days, take 1 tablet for 3 days, take 1/2 tablet for 4 days.  OSA (obstructive sleep apnea) -     Split night study; Future -     Ambulatory referral to Pulmonology  Seasonal allergies -     Ambulatory referral to Pulmonology  Chronic obstructive pulmonary disease, unspecified COPD type (HCC) -     Ambulatory referral to Pulmonology  SOB (shortness of breath) on exertion -     Ambulatory referral to Pulmonology   Appears more like asthma exacerbation likely due to [place] allergies since she was doing so well in [place]. Continue to work with allergist.  Prednisone taper given today, continue rescue inhaler as needed.  She would like a [facility] and to make another referral.   Has OSA but not able to use CPAP due to issues with machine.  Sleep night ordered with CPAP titration.    Tandy Gaw, PA-C

## 2023-02-08 ENCOUNTER — Encounter: Payer: Self-pay | Admitting: Physician Assistant

## 2023-02-08 DIAGNOSIS — I10 Essential (primary) hypertension: Secondary | ICD-10-CM

## 2023-02-08 DIAGNOSIS — R0789 Other chest pain: Secondary | ICD-10-CM

## 2023-02-08 DIAGNOSIS — R0602 Shortness of breath: Secondary | ICD-10-CM

## 2023-02-08 DIAGNOSIS — G4733 Obstructive sleep apnea (adult) (pediatric): Secondary | ICD-10-CM

## 2023-02-09 ENCOUNTER — Encounter: Payer: Self-pay | Admitting: Physician Assistant

## 2023-02-10 ENCOUNTER — Ambulatory Visit (INDEPENDENT_AMBULATORY_CARE_PROVIDER_SITE_OTHER): Payer: Medicare HMO | Admitting: Physician Assistant

## 2023-02-10 ENCOUNTER — Encounter: Payer: Self-pay | Admitting: Physician Assistant

## 2023-02-10 VITALS — BP 145/72 | HR 67 | Ht 63.0 in | Wt 199.2 lb

## 2023-02-10 DIAGNOSIS — J449 Chronic obstructive pulmonary disease, unspecified: Secondary | ICD-10-CM | POA: Diagnosis not present

## 2023-02-10 DIAGNOSIS — J441 Chronic obstructive pulmonary disease with (acute) exacerbation: Secondary | ICD-10-CM | POA: Insufficient documentation

## 2023-02-10 DIAGNOSIS — F410 Panic disorder [episodic paroxysmal anxiety] without agoraphobia: Secondary | ICD-10-CM

## 2023-02-10 DIAGNOSIS — R0789 Other chest pain: Secondary | ICD-10-CM

## 2023-02-10 MED ORDER — CLONAZEPAM 0.5 MG PO TABS
0.5000 mg | ORAL_TABLET | Freq: Two times a day (BID) | ORAL | 0 refills | Status: AC | PRN
Start: 2023-02-10 — End: ?

## 2023-02-10 MED ORDER — PREDNISONE 10 MG PO TABS: ORAL_TABLET | ORAL | 0 refills | Status: DC

## 2023-02-10 NOTE — Patient Instructions (Signed)
Finish new prednisone taper.  Klonapin as needed for anxiety.  Will get stress test.

## 2023-02-10 NOTE — Progress Notes (Signed)
Acute Office Visit  Subjective:     Patient ID: Ana King, female    DOB: 06/07/1951, 72 y.o.   MRN: [id number]  Chief Complaint  Patient presents with   Anxiety    Pt states she has been having some anxiety attacks.    HPI Patient is in today for increase in feelings of anxiousness. She admits that over the past week since she has been home from travel she has had episodes of shakiness. It has gone away over time but seems to be worse since her husband took a part time job and is out of the house some. She is on prednisone for COPD/Asthma exacerbation. Her breathing is better but that also makes her anxious to be home alone and having any breathing issues. Friday night, 4 days ago, she was at home and had some CP as well. Denies any new swelling. She just feels a little on edge at some times symptoms are worse than others. She doe feel like her breathing is better but took longer for prednisone to start working. On last dose today.   .. 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   Reactive airway disease 06/09/2017   Former smoker 06/09/2017   Chronic venous stasis 12/22/2017   Muscle cramp 12/22/2017   Edema of left ankle 12/22/2017   Osteopenia 03/17/2018   SOB (shortness of breath) on exertion 01/11/2019   Depressed mood 01/11/2019   Right shoulder pain 04/12/2019   Cervical spondylosis 05/10/2019   Non-restorative sleep 08/17/2019   Snoring 08/17/2019   Class 2 obesity due to excess calories without serious comorbidity with body mass index (BMI) of 36.0 to 36.9 in adult 08/17/2019   Hyperlipidemia 08/18/2019   OSA (obstructive sleep apnea) 09/20/2019   Diarrhea 11/08/2019   Plantar fasciitis, left 01/24/2020   Acute intractable headache 01/31/2020    Vision changes 01/31/2020   Non-intractable vomiting 01/31/2020   Eye pain, right 01/31/2020   Herpes zoster with ophthalmic complication 02/03/2020   Hyponatremia 02/20/2020   Hypocalcemia 02/20/2020   Low hemoglobin 02/20/2020   AKI (acute kidney injury) (HCC) 02/21/2020   CKD (chronic kidney disease) stage 3, GFR 30-59 ml/min (HCC) 02/20/2021   Grade I diastolic dysfunction 02/26/2021   Coronary artery calcification seen on CT scan 04/17/2021   Aortic atherosclerosis (HCC) 04/17/2021   Centrilobular emphysema (HCC) 04/17/2021   Acute pain of left shoulder 06/03/2021   Bilateral leg edema 07/30/2021   Left ear impacted cerumen 07/30/2021   Vertigo 07/30/2021   Injury of right leg 10/14/2021   Seasonal allergic rhinitis due to pollen 12/24/2021   Seasonal allergies 12/24/2021   Decreased GFR 12/24/2021   Migraine without aura and without status migrainosus, not intractable 03/28/2022   Chronic allergic rhinitis 07/08/2022   Pruritus 08/05/2022   Great toe pain, right 09/03/2022   Gout 09/05/2022   Pain of great toe, left 12/12/2022   Mild intermittent asthma with exacerbation 02/04/2023   Atypical chest pain 02/10/2023   Chronic obstructive pulmonary disease (HCC) 02/10/2023   Resolved Ambulatory Problems    Diagnosis Date Noted   Tobacco abuse 11/02/2013   COPD (chronic obstructive pulmonary disease) (HCC) 11/02/2013   Past Medical History:  Diagnosis Date   Asthma      ROS See HPI.  Objective:    BP (!) 145/72   Pulse 67   Ht 5\' 3"  (1.6 m)   Wt 199 lb 4 oz (90.4 kg)   SpO2 96%   BMI 35.30 kg/m  BP Readings from Last 3 Encounters:  02/10/23 (!) 145/72  02/04/23 123/60  12/26/22 116/68   Wt Readings from Last 3 Encounters:  02/10/23 199 lb 4 oz (90.4 kg)  02/04/23 198 lb 1.9 oz (89.9 kg)  12/26/22 197 lb (89.4 kg)    ..    02/10/2023   10:26 AM 02/04/2023    2:20 PM 11/11/2022    1:14 PM 10/28/2022   10:10 AM 09/03/2022    2:48 PM  Depression  screen PHQ 2/9  Decreased Interest 0 0 0 0 0  Down, Depressed, Hopeless 0 0 0 0 0  PHQ - 2 Score 0 0 0 0 0   .[name]    02/10/2023    4:25 PM 01/11/2019    2:14 PM 11/10/2017    1:08 PM  GAD 7 : Generalized Anxiety Score  Nervous, Anxious, on Edge 2 1 0  Control/stop worrying 1 0 0  Worry too much - different things 2 0 0  Trouble relaxing 2 0 0  Restless 2 0 0  Easily annoyed or irritable 1 0 0  Afraid - awful might happen 1 0 0  Total GAD 7 Score 11 1 0  Anxiety Difficulty Somewhat difficult Not difficult at all    EKG- NSR at 69, no ST elevation or depression. No acute changes from last EKG.    Physical Exam Constitutional:      Appearance: Normal appearance. She is obese.  HENT:     Head: Normocephalic.  Neck:     Vascular: No carotid bruit.  Cardiovascular:     Rate and Rhythm: Normal rate and regular rhythm.     Pulses: Normal pulses.     Heart sounds: Normal heart sounds.  Musculoskeletal:     Right lower leg: No edema.     Left lower leg: No edema.  Neurological:     General: No focal deficit present.     Mental Status: She is alert and oriented to person, place, and time.  Psychiatric:        Mood and Affect: Mood normal.         Assessment & Plan:  Marland KitchenMarland KitchenZineb was seen today for anxiety.  Diagnoses and all orders for this visit:  COPD exacerbation (HCC) -     predniSONE (DELTASONE) 10 MG tablet; Take one tablet daily for 5 days then 1/2 tablet daily for 4 days.  Atypical chest pain -     clonazePAM (KLONOPIN) 0.5 MG tablet; Take 1 tablet (0.5 mg total) by mouth 2 (two) times daily as needed for anxiety. -     EXERCISE TOLERANCE TEST (ETT); Future -     Cardiac Stress Test: Informed Consent Details: Physician/Practitioner Attestation; Transcribe to consent form and obtain patient signature  Anxiety attack -     clonazePAM (KLONOPIN) 0.5 MG tablet; Take 1 tablet (0.5 mg total) by mouth 2 (two) times daily as needed for anxiety.  Chronic obstructive  pulmonary disease, unspecified COPD type (HCC)   BP not to goal even on 2nd recheck Pt is on prednisone, will recheck in 4 weeks.  GAD numbers way up, discussed prednisone can make anxiousness worse as well Having some CP, no EKG findings that are worrisome.  Will get stress test Extended prednisone for COPD exacerbation  out for a few more days.  Klonapin for as needed anxiety attack symptoms, use sparingly Follow up in 4 weeks  Return in about 4 weeks (around 03/10/2023) for BP recheck.  Tandy Gaw, PA-C

## 2023-02-11 ENCOUNTER — Encounter: Payer: Self-pay | Admitting: Physician Assistant

## 2023-02-11 NOTE — Addendum Note (Signed)
Addended by: Chalmers Cater on: 02/11/2023 03:35 PM   Modules accepted: Orders

## 2023-02-26 ENCOUNTER — Telehealth (HOSPITAL_COMMUNITY): Payer: Self-pay | Admitting: *Deleted

## 2023-02-26 NOTE — Telephone Encounter (Signed)
Instructions given for ETT on 03/02/23 at 3:30.

## 2023-02-27 ENCOUNTER — Encounter (HOSPITAL_COMMUNITY): Payer: Medicare HMO

## 2023-03-02 ENCOUNTER — Ambulatory Visit (HOSPITAL_COMMUNITY): Payer: Medicare HMO

## 2023-03-10 ENCOUNTER — Encounter (HOSPITAL_COMMUNITY): Payer: Self-pay | Admitting: Physician Assistant

## 2023-03-10 ENCOUNTER — Encounter: Payer: Self-pay | Admitting: Physician Assistant

## 2023-03-10 ENCOUNTER — Ambulatory Visit (INDEPENDENT_AMBULATORY_CARE_PROVIDER_SITE_OTHER): Payer: Medicare HMO | Admitting: Physician Assistant

## 2023-03-10 ENCOUNTER — Encounter (HOSPITAL_COMMUNITY): Payer: Self-pay

## 2023-03-10 VITALS — BP 134/63 | HR 88 | Ht 63.0 in | Wt 200.0 lb

## 2023-03-10 DIAGNOSIS — Z23 Encounter for immunization: Secondary | ICD-10-CM | POA: Diagnosis not present

## 2023-03-10 DIAGNOSIS — F410 Panic disorder [episodic paroxysmal anxiety] without agoraphobia: Secondary | ICD-10-CM | POA: Diagnosis not present

## 2023-03-10 DIAGNOSIS — F411 Generalized anxiety disorder: Secondary | ICD-10-CM | POA: Insufficient documentation

## 2023-03-10 DIAGNOSIS — R0789 Other chest pain: Secondary | ICD-10-CM

## 2023-03-10 MED ORDER — FAMOTIDINE 20 MG PO TABS: 20.0000 mg | ORAL_TABLET | Freq: Every day | ORAL | 0 refills | Status: DC

## 2023-03-10 MED ORDER — SERTRALINE HCL 25 MG PO TABS: 25.0000 mg | ORAL_TABLET | Freq: Every day | ORAL | 1 refills | Status: DC

## 2023-03-10 NOTE — Progress Notes (Signed)
Established Patient Office Visit  Subjective   Patient ID: Ana King, female    DOB: 09/29/1950  Age: 72 y.o. MRN: [id number]  No chief complaint on file.   HPI Pt is a 72 yo female who presents to the clinic to follow up on anxiety, SOB and atypical chest pain.   Her anxiety is better but she stills gets anxious when she is alone or in unknown situation. Her SOB is much better. Finished prednisone 2 weeks ago. She has some SOB with walking longer distances. She has not had chest pain until last night when she was laying down in lower left chest and into upper abdomen. Last for a few seconds and then went away. She has sleep study scheduled. She has not been called for stress test. She has used klonapin about 6 times and does help the anxiety a lot.    Patient Active Problem List   Diagnosis Date Noted   GAD (generalized anxiety disorder) 03/10/2023   Anxiety attack 03/10/2023   Atypical chest pain 02/10/2023   Chronic obstructive pulmonary disease (HCC) 02/10/2023   Mild intermittent asthma with exacerbation 02/04/2023   Pain of great toe, left 12/12/2022   Gout 09/05/2022   Great toe pain, right 09/03/2022   Pruritus 08/05/2022   Chronic allergic rhinitis 07/08/2022   Migraine without aura and without status migrainosus, not intractable 03/28/2022   Seasonal allergic rhinitis due to pollen 12/24/2021   Seasonal allergies 12/24/2021   Decreased GFR 12/24/2021   Injury of right leg 10/14/2021   Bilateral leg edema 07/30/2021   Left ear impacted cerumen 07/30/2021   Vertigo 07/30/2021   Acute pain of left shoulder 06/03/2021   Coronary artery calcification seen on CT scan 04/17/2021   Aortic atherosclerosis (HCC) 04/17/2021   Centrilobular emphysema (HCC) 04/17/2021   Grade I diastolic dysfunction 02/26/2021   CKD (chronic kidney disease) stage 3, GFR 30-59 ml/min (HCC) 02/20/2021   AKI (acute kidney injury) (HCC) 02/21/2020   Hyponatremia 02/20/2020   Hypocalcemia  02/20/2020   Low hemoglobin 02/20/2020   Herpes zoster with ophthalmic complication 02/03/2020   Acute intractable headache 01/31/2020   Vision changes 01/31/2020   Non-intractable vomiting 01/31/2020   Eye pain, right 01/31/2020   Plantar fasciitis, left 01/24/2020   Diarrhea 11/08/2019   OSA (obstructive sleep apnea) 09/20/2019   Hyperlipidemia 08/18/2019   Non-restorative sleep 08/17/2019   Snoring 08/17/2019   Class 2 obesity due to excess calories without serious comorbidity with body mass index (BMI) of 36.0 to 36.9 in adult 08/17/2019   Cervical spondylosis 05/10/2019   Right shoulder pain 04/12/2019   SOB (shortness of breath) on exertion 01/11/2019   Depressed mood 01/11/2019   Osteopenia 03/17/2018   Chronic venous stasis 12/22/2017   Muscle cramp 12/22/2017   Edema of left ankle 12/22/2017   Reactive airway disease 06/09/2017   Former smoker 06/09/2017   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   Contusion 11/13/2015   Essential hypertension, benign 08/28/2015   Hiatal hernia 12/09/2013   Anxiety state 11/02/2013   GERD (gastroesophageal reflux disease) 11/02/2013   Past Medical History:  Diagnosis Date   Asthma    Essential hypertension, benign 08/28/2015   GERD (gastroesophageal reflux disease)    Family History  Problem Relation Age of Onset   Cancer Mother    Diabetes Maternal Aunt    Hypertension Maternal Aunt    Cancer Maternal Grandmother    Diabetes Maternal Grandmother    Allergies  Allergen Reactions   Penicillins Hives  ROS   See HPI.  Objective:     BP 134/63   Pulse 88   Ht 5\' 3"  (1.6 m)   Wt 200 lb (90.7 kg)   SpO2 99%   BMI 35.43 kg/m  BP Readings from Last 3 Encounters:  03/10/23 134/63  02/10/23 (!) 145/72  02/04/23 123/60   Wt Readings from Last 3 Encounters:  03/10/23 200 lb (90.7 kg)  02/10/23 199 lb 4 oz (90.4 kg)  02/04/23 198 lb 1.9 oz (89.9 kg)      ..    03/10/2023   11:26 AM 02/10/2023    4:25 PM 01/11/2019     2:14 PM 11/10/2017    1:08 PM  GAD 7 : Generalized Anxiety Score  Nervous, Anxious, on Edge 1 2 1  0  Control/stop worrying 0 1 0 0  Worry too much - different things 0 2 0 0  Trouble relaxing 0 2 0 0  Restless 0 2 0 0  Easily annoyed or irritable 1 1 0 0  Afraid - awful might happen 1 1 0 0  Total GAD 7 Score 3 11 1  0  Anxiety Difficulty Somewhat difficult Somewhat difficult Not difficult at all     .[name]    03/10/2023   11:26 AM 02/10/2023   10:26 AM 02/04/2023    2:20 PM 11/11/2022    1:14 PM 10/28/2022   10:10 AM  Depression screen PHQ 2/9  Decreased Interest 1 0 0 0 0  Down, Depressed, Hopeless 1 0 0 0 0  PHQ - 2 Score 2 0 0 0 0  Altered sleeping 2      Tired, decreased energy 2      Change in appetite 0      Feeling bad or failure about yourself  0      Trouble concentrating 1      Moving slowly or fidgety/restless 0      Suicidal thoughts 0      PHQ-9 Score 7      Difficult doing work/chores Somewhat difficult         Physical Exam Constitutional:      Appearance: Normal appearance.  HENT:     Head: Normocephalic.  Cardiovascular:     Rate and Rhythm: Normal rate and regular rhythm.  Pulmonary:     Effort: Pulmonary effort is normal.  Musculoskeletal:     Right lower leg: No edema.     Left lower leg: No edema.  Neurological:     General: No focal deficit present.     Mental Status: She is alert and oriented to person, place, and time.  Psychiatric:        Mood and Affect: Mood normal.         Assessment & Plan:  Marland KitchenMarland KitchenDiagnoses and all orders for this visit:  GAD (generalized anxiety disorder) -     sertraline (ZOLOFT) 25 MG tablet; Take 1 tablet (25 mg total) by mouth daily.  Anxiety attack -     sertraline (ZOLOFT) 25 MG tablet; Take 1 tablet (25 mg total) by mouth daily.  Atypical chest pain -     famotidine (PEPCID) 20 MG tablet; Take 1 tablet (20 mg total) by mouth at bedtime.  Need for influenza vaccination -     Flu vaccine HIGH DOSE PF  (Fluzone High dose)   PHQ and GAD numbers have improved but patient is still feeling anxious Starting zoloft 25mg  daily Discussed SE.  Follow up in 4 weeks [facility]  to use klonapin as needed  Suspect GERD could be causing CP Start pepcid at bedtime with prevacid Follow up in 4 weeks Will make sure she gets scheduled for stress test   Return in about 4 weeks (around 04/07/2023).    Tandy Gaw, PA-C

## 2023-03-11 DIAGNOSIS — J449 Chronic obstructive pulmonary disease, unspecified: Secondary | ICD-10-CM | POA: Diagnosis not present

## 2023-03-11 DIAGNOSIS — J4550 Severe persistent asthma, uncomplicated: Secondary | ICD-10-CM | POA: Diagnosis not present

## 2023-03-11 DIAGNOSIS — J302 Other seasonal allergic rhinitis: Secondary | ICD-10-CM | POA: Diagnosis not present

## 2023-03-12 ENCOUNTER — Ambulatory Visit (HOSPITAL_COMMUNITY): Payer: Medicare HMO | Attending: Physician Assistant

## 2023-03-12 DIAGNOSIS — R0789 Other chest pain: Secondary | ICD-10-CM

## 2023-03-12 DIAGNOSIS — R0602 Shortness of breath: Secondary | ICD-10-CM | POA: Diagnosis not present

## 2023-03-12 DIAGNOSIS — G4733 Obstructive sleep apnea (adult) (pediatric): Secondary | ICD-10-CM | POA: Diagnosis not present

## 2023-03-12 DIAGNOSIS — I10 Essential (primary) hypertension: Secondary | ICD-10-CM | POA: Diagnosis not present

## 2023-03-12 LAB — MYOCARDIAL PERFUSION IMAGING
LV dias vol: 43 mL (ref 46–106)
LV sys vol: 14 mL
Nuc Stress EF: 68 %
Peak HR: 95 {beats}/min
Rest HR: 79 {beats}/min
Rest Nuclear Isotope Dose: 10.5 mCi
SDS: 0
SRS: 0
SSS: 0
ST Depression (mm): 0 mm
Stress Nuclear Isotope Dose: 33 mCi
TID: 0.94

## 2023-03-12 MED ORDER — TECHNETIUM TC 99M TETROFOSMIN IV KIT
10.5000 | PACK | Freq: Once | INTRAVENOUS | Status: AC | PRN
  Administered 2023-03-12: 10.5 via INTRAVENOUS

## 2023-03-12 MED ORDER — TECHNETIUM TC 99M TETROFOSMIN IV KIT
33.0000 | PACK | Freq: Once | INTRAVENOUS | Status: AC | PRN
  Administered 2023-03-12: 33 via INTRAVENOUS

## 2023-03-12 MED ORDER — REGADENOSON 0.4 MG/5ML IV SOLN
0.4000 mg | Freq: Once | INTRAVENOUS | Status: AC
Start: 2023-03-12 — End: 2023-03-12
  Administered 2023-03-12: 0.4 mg via INTRAVENOUS

## 2023-03-13 ENCOUNTER — Other Ambulatory Visit: Payer: Self-pay

## 2023-03-13 NOTE — Progress Notes (Signed)
Normal stress test. GREAT news.

## 2023-03-17 ENCOUNTER — Ambulatory Visit (HOSPITAL_BASED_OUTPATIENT_CLINIC_OR_DEPARTMENT_OTHER): Payer: Medicare HMO | Attending: Physician Assistant | Admitting: Internal Medicine

## 2023-03-17 VITALS — Ht 64.0 in | Wt 197.0 lb

## 2023-03-17 DIAGNOSIS — G4736 Sleep related hypoventilation in conditions classified elsewhere: Secondary | ICD-10-CM | POA: Insufficient documentation

## 2023-03-17 DIAGNOSIS — R5383 Other fatigue: Secondary | ICD-10-CM | POA: Insufficient documentation

## 2023-03-17 DIAGNOSIS — G4733 Obstructive sleep apnea (adult) (pediatric): Secondary | ICD-10-CM | POA: Diagnosis not present

## 2023-03-17 DIAGNOSIS — G4719 Other hypersomnia: Secondary | ICD-10-CM

## 2023-03-22 DIAGNOSIS — G4719 Other hypersomnia: Secondary | ICD-10-CM | POA: Diagnosis not present

## 2023-03-22 NOTE — Procedures (Signed)
       Patient Name: Ana King, Ana King Date: 03/17/2023 Gender: Female D.O.B: 1951/06/30 Age (years): 52 Referring Provider: Tandy Gaw Height (inches): 64 Interpreting Physician: Jetty Duhamel MD, ABSM Weight (lbs): 197 RPSGT: Shelah Lewandowsky BMI: 34 MRN: [id number] Neck Size: 15.50  CLINICAL INFORMATION Sleep Study Type: NPSG Indication for sleep study: COPD, Excessive Daytime Sleepiness, Fatigue, Hypertension, Obesity, OSA, Snoring Epworth Sleepiness Score: 3  SLEEP STUDY TECHNIQUE As per the AASM Manual for the Scoring of Sleep and Associated Events v2.3 (April 2016) with a hypopnea requiring 4% desaturations.  The channels recorded and monitored were frontal, central and occipital EEG, electrooculogram (EOG), submentalis EMG (chin), nasal and oral airflow, thoracic and abdominal wall motion, anterior tibialis EMG, snore microphone, electrocardiogram, and pulse oximetry.  MEDICATIONS Medications self-administered by patient taken the night of the study : ATORVASTATIN, CALCIUM, MONTELUKAST, SERTRALINE  SLEEP ARCHITECTURE The study was initiated at 10:11:48 PM and ended at 4:34:38 AM.  Sleep onset time was 64.9 minutes and the sleep efficiency was 31.0%. The total sleep time was 118.5 minutes.  Stage REM latency was 221.0 minutes.  The patient spent 52.7% of the night in stage N1 sleep, 38.4% in stage N2 sleep, 0.0% in stage N3 and 8.9% in REM.  Alpha intrusion was absent.  Supine sleep was 8.02%.  RESPIRATORY PARAMETERS The overall apnea/hypopnea index (AHI) was 13.2 per hour. There were 0 total apneas, including 0 obstructive, 0 central and 0 mixed apneas. There were 26 hypopneas and 24 RERAs.  The AHI during Stage REM sleep was 5.7 per hour.  AHI while supine was 107.4 per hour.  The mean oxygen saturation was 87.8%. The minimum SpO2 during sleep was 81.0%.  soft snoring was noted during this study.  CARDIAC DATA The 2 lead EKG demonstrated sinus  rhythm. The mean heart rate was 75.4 beats per minute. Other EKG findings include: PVCs.  LEG MOVEMENT DATA The total PLMS were 0 with a resulting PLMS index of 0.0. Associated arousal with leg movement index was 0.0 .  IMPRESSIONS - Mild obstructive sleep apnea occurred during this study (AHI = 13.2/h). - Significant delay in sleep onset, with insufficient early sleep to meet protocol requirement for split CPAP titration. - Oxygen desaturation was noted during this study (Min O2 = 81.0%, Mean 87.8%). Time with O2 saturation 88% or less was 64.3 minutes. - The patient snored with soft snoring volume. - EKG findings include PVCs. - Clinically significant periodic limb movements did not occur during sleep. No significant associated arousals.  DIAGNOSIS - Obstructive Sleep Apnea (G47.33) - Nocturnal Hypoxemia (G47.36)  RECOMMENDATIONS - Suggest CPAP titration sleep study or autopap. In-center titration will provide insurance documentation if supplemental O2 is also indicated.  - Additional management of insomnia might also be helpful. - Sleep hygiene should be reviewed to assess factors that may improve sleep quality. - Weight management and regular exercise should be initiated or continued if appropriate.  [Electronically signed] 03/22/2023 11:19 AM  Jetty Duhamel MD, ABSM Diplomate, American Board of Sleep Medicine NPI: [id number]                      Jetty Duhamel Diplomate, American Board of Sleep Medicine  ELECTRONICALLY SIGNED ON:  03/22/2023, 11:04 AM [facility] PH: [phone]   FX: [phone] ACCREDITED BY THE AMERICAN ACADEMY OF SLEEP MEDICINE

## 2023-04-07 ENCOUNTER — Ambulatory Visit (INDEPENDENT_AMBULATORY_CARE_PROVIDER_SITE_OTHER): Payer: Medicare HMO | Admitting: Physician Assistant

## 2023-04-07 ENCOUNTER — Ambulatory Visit: Payer: Medicare HMO

## 2023-04-07 ENCOUNTER — Encounter: Payer: Self-pay | Admitting: Physician Assistant

## 2023-04-07 VITALS — BP 134/58 | HR 71 | Ht 63.0 in | Wt 200.2 lb

## 2023-04-07 DIAGNOSIS — Z1211 Encounter for screening for malignant neoplasm of colon: Secondary | ICD-10-CM | POA: Diagnosis not present

## 2023-04-07 DIAGNOSIS — F411 Generalized anxiety disorder: Secondary | ICD-10-CM

## 2023-04-07 DIAGNOSIS — F410 Panic disorder [episodic paroxysmal anxiety] without agoraphobia: Secondary | ICD-10-CM | POA: Diagnosis not present

## 2023-04-07 DIAGNOSIS — M810 Age-related osteoporosis without current pathological fracture: Secondary | ICD-10-CM | POA: Diagnosis not present

## 2023-04-07 DIAGNOSIS — M25511 Pain in right shoulder: Secondary | ICD-10-CM

## 2023-04-07 DIAGNOSIS — M79601 Pain in right arm: Secondary | ICD-10-CM | POA: Diagnosis not present

## 2023-04-07 MED ORDER — SERTRALINE HCL 25 MG PO TABS
25.0000 mg | ORAL_TABLET | Freq: Every day | ORAL | 1 refills | Status: DC
Start: 2023-04-07 — End: 2023-09-18

## 2023-04-07 NOTE — Progress Notes (Signed)
Established Patient Office Visit  Subjective   Patient ID: Ana King, female    DOB: 02-07-1951  Age: 72 y.o. MRN: [id number]  Chief Complaint  Patient presents with   Hypertension   Depression    Follow up after starting Sertraline.     HPI Pt is 72 yo obese female who presents to the clinic to follow up on mood.   Her anxiety and panic is doing much better on zoloft. She denies any concerns with medication. She has only had one mild panic attack. No SI/HC. Has plenty of klonapin left.   She is having right shoulder pain that persist for the last 2-3 months. She denies any injury or trauma. Home exercises for rotator cuff for 6 weeks have not been helping. ROM hurts the worse and starting to lose strength.   .. 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   Reactive airway disease 06/09/2017   Former smoker 06/09/2017   Chronic venous stasis 12/22/2017   Muscle cramp 12/22/2017   Edema of left ankle 12/22/2017   Osteopenia 03/17/2018   SOB (shortness of breath) on exertion 01/11/2019   Depressed mood 01/11/2019   Right shoulder pain 04/12/2019   Cervical spondylosis 05/10/2019   Non-restorative sleep 08/17/2019   Snoring 08/17/2019   Class 2 obesity due to excess calories without serious comorbidity with body mass index (BMI) of 36.0 to 36.9 in adult 08/17/2019   Hyperlipidemia 08/18/2019   OSA (obstructive sleep apnea) 09/20/2019   Diarrhea 11/08/2019   Plantar fasciitis, left 01/24/2020   Acute intractable headache 01/31/2020   Vision changes 01/31/2020   Non-intractable vomiting 01/31/2020   Eye pain, right 01/31/2020   Herpes zoster with ophthalmic complication 02/03/2020   Hyponatremia 02/20/2020   Hypocalcemia 02/20/2020   Low hemoglobin  02/20/2020   AKI (acute kidney injury) (HCC) 02/21/2020   CKD (chronic kidney disease) stage 3, GFR 30-59 ml/min (HCC) 02/20/2021   Grade I diastolic dysfunction 02/26/2021   Coronary artery calcification seen on CT scan 04/17/2021   Aortic atherosclerosis (HCC) 04/17/2021   Centrilobular emphysema (HCC) 04/17/2021   Acute pain of left shoulder 06/03/2021   Bilateral leg edema 07/30/2021   Left ear impacted cerumen 07/30/2021   Vertigo 07/30/2021   Injury of right leg 10/14/2021   Seasonal allergic rhinitis due to pollen 12/24/2021   Seasonal allergies 12/24/2021   Decreased GFR 12/24/2021   Migraine without aura and without status migrainosus, not intractable 03/28/2022   Chronic allergic rhinitis 07/08/2022   Pruritus 08/05/2022   Great toe pain, right 09/03/2022   Gout 09/05/2022   Pain of great toe, left 12/12/2022   Mild intermittent asthma with exacerbation 02/04/2023   Atypical chest pain 02/10/2023   Chronic obstructive pulmonary disease (HCC) 02/10/2023   GAD (generalized anxiety disorder) 03/10/2023   Anxiety attack 03/10/2023   Excessive daytime sleepiness 03/17/2023   Fatigue 03/17/2023   Resolved Ambulatory Problems    Diagnosis Date Noted   Tobacco abuse 11/02/2013   COPD (chronic obstructive pulmonary disease) (HCC) 11/02/2013   Past Medical History:  Diagnosis Date   Asthma      ROS See HPI.    Objective:     BP (!) 134/58   Pulse 71   Ht 5\' 3"  (1.6 m)   Wt 200 lb 4  oz (90.8 kg)   SpO2 97%   BMI 35.47 kg/m  BP Readings from Last 3 Encounters:  04/07/23 (!) 134/58  03/10/23 134/63  02/10/23 (!) 145/72   Wt Readings from Last 3 Encounters:  04/07/23 200 lb 4 oz (90.8 kg)  03/17/23 197 lb (89.4 kg)  03/10/23 200 lb (90.7 kg)    .[name]    04/07/2023    2:06 PM 03/10/2023   11:26 AM 02/10/2023   10:26 AM 02/04/2023    2:20 PM 11/11/2022    1:14 PM  Depression screen PHQ 2/9  Decreased Interest 0 1 0 0 0  Down, Depressed, Hopeless 0 1 0 0 0   PHQ - 2 Score 0 2 0 0 0  Altered sleeping 0 2     Tired, decreased energy 1 2     Change in appetite 0 0     Feeling bad or failure about yourself  0 0     Trouble concentrating 0 1     Moving slowly or fidgety/restless 0 0     Suicidal thoughts 0 0     PHQ-9 Score 1 7     Difficult doing work/chores Not difficult at all Somewhat difficult      .[name]    04/07/2023    2:06 PM 03/10/2023   11:26 AM 02/10/2023    4:25 PM 01/11/2019    2:14 PM  GAD 7 : Generalized Anxiety Score  Nervous, Anxious, on Edge 1 1 2 1   Control/stop worrying 0 0 1 0  Worry too much - different things 0 0 2 0  Trouble relaxing 0 0 2 0  Restless 0 0 2 0  Easily annoyed or irritable 0 1 1 0  Afraid - awful might happen 0 1 1 0  Total GAD 7 Score 1 3 11 1   Anxiety Difficulty Not difficult at all Somewhat difficult Somewhat difficult Not difficult at all      Physical Exam Constitutional:      Appearance: Normal appearance. She is obese.  HENT:     Head: Normocephalic.  Cardiovascular:     Rate and Rhythm: Normal rate and regular rhythm.  Pulmonary:     Effort: Pulmonary effort is normal.     Breath sounds: Normal breath sounds.  Musculoskeletal:     Cervical back: Normal range of motion and neck supple. No tenderness.     Right lower leg: No edema.     Left lower leg: No edema.     Comments: Decreased ROM of right shoulder due to pain Comfortably abduct right arm to 100 degrees Popping with ROM Pain and popping with external rotation No tenderness over anterior shoulder Pain with palpation over posterior and lateral shoulder to palpation Strength 4/5 right upper arm  Lymphadenopathy:     Cervical: No cervical adenopathy.  Neurological:     General: No focal deficit present.     Mental Status: She is alert and oriented to person, place, and time.  Psychiatric:        Mood and Affect: Mood normal.        Assessment & Plan:  Marland KitchenMarland KitchenLizet was seen today for hypertension and  depression.  Diagnoses and all orders for this visit:  GAD (generalized anxiety disorder) -     sertraline (ZOLOFT) 25 MG tablet; Take 1 tablet (25 mg total) by mouth daily.  Anxiety attack -     sertraline (ZOLOFT) 25 MG tablet; Take 1 tablet (25 mg total) by mouth  daily.  Colon cancer screening -     Cologuard  Acute pain of right shoulder -     DG Shoulder Right; Future   PHQ/GAD numbers look much better and pt feels better Zoloft refilled Follow up in 6 months  Colo guard ordered, declined colonoscopy  Xray ordered for right shoulder ? Rotator cuff tendonitis vs impingement syndrome No improvement with 6 week of home therapy Will consider MRI after get xray back    Tandy Gaw, PA-C

## 2023-04-18 ENCOUNTER — Other Ambulatory Visit: Payer: Self-pay | Admitting: Physician Assistant

## 2023-04-18 DIAGNOSIS — R0789 Other chest pain: Secondary | ICD-10-CM

## 2023-04-18 DIAGNOSIS — I10 Essential (primary) hypertension: Secondary | ICD-10-CM

## 2023-04-18 DIAGNOSIS — I700 Atherosclerosis of aorta: Secondary | ICD-10-CM

## 2023-04-18 DIAGNOSIS — E782 Mixed hyperlipidemia: Secondary | ICD-10-CM

## 2023-04-22 ENCOUNTER — Encounter: Payer: Self-pay | Admitting: Physician Assistant

## 2023-04-22 DIAGNOSIS — M19011 Primary osteoarthritis, right shoulder: Secondary | ICD-10-CM | POA: Insufficient documentation

## 2023-04-22 DIAGNOSIS — M75101 Unspecified rotator cuff tear or rupture of right shoulder, not specified as traumatic: Secondary | ICD-10-CM | POA: Insufficient documentation

## 2023-04-22 NOTE — Progress Notes (Signed)
Arthritis noted. Consider appt with Dr. Karie Schwalbe to consider injection.

## 2023-05-01 ENCOUNTER — Other Ambulatory Visit: Payer: Self-pay

## 2023-05-01 DIAGNOSIS — Z87891 Personal history of nicotine dependence: Secondary | ICD-10-CM

## 2023-05-01 DIAGNOSIS — Z122 Encounter for screening for malignant neoplasm of respiratory organs: Secondary | ICD-10-CM

## 2023-05-07 ENCOUNTER — Other Ambulatory Visit (INDEPENDENT_AMBULATORY_CARE_PROVIDER_SITE_OTHER): Payer: Medicare HMO

## 2023-05-07 ENCOUNTER — Encounter: Payer: Self-pay | Admitting: Sports Medicine

## 2023-05-07 ENCOUNTER — Ambulatory Visit: Payer: Medicare HMO | Admitting: Sports Medicine

## 2023-05-07 ENCOUNTER — Institutional Professional Consult (permissible substitution): Payer: Medicare HMO | Admitting: Pulmonary Disease

## 2023-05-07 DIAGNOSIS — M75101 Unspecified rotator cuff tear or rupture of right shoulder, not specified as traumatic: Secondary | ICD-10-CM | POA: Diagnosis not present

## 2023-05-07 DIAGNOSIS — M75121 Complete rotator cuff tear or rupture of right shoulder, not specified as traumatic: Secondary | ICD-10-CM | POA: Diagnosis not present

## 2023-05-07 MED ORDER — TRIAZOLAM 0.25 MG PO TABS
ORAL_TABLET | ORAL | 0 refills | Status: DC
Start: 2023-05-07 — End: 2024-01-01

## 2023-05-07 MED ORDER — TRIAMCINOLONE ACETONIDE 40 MG/ML IJ SUSP
40.0000 mg | Freq: Once | INTRAMUSCULAR | Status: AC
Start: 2023-05-07 — End: 2023-05-07
  Administered 2023-05-07: 40 mg via INTRAMUSCULAR

## 2023-05-07 NOTE — Assessment & Plan Note (Addendum)
Pleasant 72 year old female, she has had about 2 months of pain right shoulder localized over the deltoid worse with abduction, she has done some home physical therapy, she had x-rays. I reviewed the x-rays myself and they were for the most part negative. On exam she has positive impingement signs, she also has profound weakness to abduction with a positive painful arc. All of this points toward a large rotator cuff tear. I explained the anatomy and pathophysiology, she understands the treatment course involves aggressive physical therapy to retrain the remaining intact muscles to take over, we will get an MRI at this point due to failure of greater than 6 weeks of conservative treatment for diagnosis confirmation. Triazolam for claustrophobia. The patient did request an injection for some pain relief, I explained this pain relief was meant to facilitate physical therapy and not to cure the problem. I would like to see her back in about 6 to 8 weeks.

## 2023-05-07 NOTE — Progress Notes (Signed)
    Procedures performed today:    Procedure: Real-time Ultrasound Guided injection of the right subacromial bursa Device: Samsung HS60  Verbal informed consent obtained.  Time-out conducted.  Noted no overlying erythema, induration, or other signs of local infection.  Skin prepped in a sterile fashion.  Local anesthesia: Topical Ethyl chloride.  With sterile technique and under real time ultrasound guidance: Rotator cuff tearing noted, 1 cc Kenalog 40, 1 cc lidocaine, 1 cc bupivacaine injected easily Completed without difficulty  Advised to call if fevers/chills, erythema, induration, drainage, or persistent bleeding.  Images permanently stored and available for review in PACS.  Impression: Technically successful ultrasound guided injection.  Independent interpretation of notes and tests performed by another provider:   X-rays personally reviewed, there is only minimal acromioclavicular degenerative changes, glenohumeral joint appears intact.    Brief History, Exam, Impression, and Recommendations:    Rotator cuff tear, right Pleasant 72 year old female, she has had about 2 months of pain right shoulder localized over the deltoid worse with abduction, she has done some home physical therapy, she had x-rays. I reviewed the x-rays myself and they were for the most part negative. On exam she has positive impingement signs, she also has profound weakness to abduction with a positive painful arc. All of this points toward a large rotator cuff tear. I explained the anatomy and pathophysiology, she understands the treatment course involves aggressive physical therapy to retrain the remaining intact muscles to take over, we will get an MRI at this point due to failure of greater than 6 weeks of conservative treatment for diagnosis confirmation. The patient did request an injection for some pain relief, I explained this pain relief was meant to facilitate physical therapy and not to cure the  problem. I would like to see her back in about 6 to 8 weeks.    ____________________________________________ Ihor Austin. Benjamin Stain, M.D., ABFM., CAQSM., AME. Primary Care and Sports Medicine [facility]  Adjunct Professor of Family Medicine  [employer]  Restaurant manager, [employer] food

## 2023-05-09 ENCOUNTER — Encounter: Payer: Self-pay | Admitting: Physician Assistant

## 2023-05-10 ENCOUNTER — Encounter: Payer: Self-pay | Admitting: Physician Assistant

## 2023-05-11 NOTE — Telephone Encounter (Signed)
I called and left a message for a return call.  

## 2023-05-12 MED ORDER — AMBULATORY NON FORMULARY MEDICATION: 0 refills | Status: AC

## 2023-05-12 MED ORDER — AMBULATORY NON FORMULARY MEDICATION: 0 refills | Status: DC

## 2023-05-12 NOTE — Addendum Note (Signed)
Addended by: Chalmers Cater on: 05/12/2023 03:00 PM   Modules accepted: Orders

## 2023-05-12 NOTE — Therapy (Signed)
OUTPATIENT PHYSICAL THERAPY SHOULDER EVALUATION   Patient Name: Ana King MRN: [id number] DOB:March 03, 1951, 72 y.o., female Today's Date: 05/13/2023  END OF SESSION:  PT End of Session - 05/13/23 1029     Visit Number 1    Number of Visits 24    Date for PT Re-Evaluation 08/05/23    Authorization Type Francine Graven auth required $25 copay    Progress Note Due on Visit 10    PT Start Time 0930    PT Stop Time 1025    PT Time Calculation (min) 55 min    Activity Tolerance Patient tolerated treatment well             Past Medical History:  Diagnosis Date   Asthma    Essential hypertension, benign 08/28/2015   GERD (gastroesophageal reflux disease)    Past Surgical History:  Procedure Laterality Date   ABDOMINAL HYSTERECTOMY     Patient Active Problem List   Diagnosis Date Noted   Rotator cuff tear, right 04/22/2023   Excessive daytime sleepiness 03/17/2023   Fatigue 03/17/2023   GAD (generalized anxiety disorder) 03/10/2023   Anxiety attack 03/10/2023   Atypical chest pain 02/10/2023   Chronic obstructive pulmonary disease (HCC) 02/10/2023   Mild intermittent asthma with exacerbation 02/04/2023   Pain of great toe, left 12/12/2022   Gout 09/05/2022   Great toe pain, right 09/03/2022   Pruritus 08/05/2022   Chronic allergic rhinitis 07/08/2022   Migraine without aura and without status migrainosus, not intractable 03/28/2022   Seasonal allergic rhinitis due to pollen 12/24/2021   Seasonal allergies 12/24/2021   Decreased GFR 12/24/2021   Injury of right leg 10/14/2021   Bilateral leg edema 07/30/2021   Left ear impacted cerumen 07/30/2021   Vertigo 07/30/2021   Acute pain of left shoulder 06/03/2021   Coronary artery calcification seen on CT scan 04/17/2021   Aortic atherosclerosis (HCC) 04/17/2021   Centrilobular emphysema (HCC) 04/17/2021   Grade I diastolic dysfunction 02/26/2021   CKD (chronic kidney disease) stage 3, GFR 30-59 ml/min (HCC) 02/20/2021    AKI (acute kidney injury) (HCC) 02/21/2020   Hyponatremia 02/20/2020   Hypocalcemia 02/20/2020   Low hemoglobin 02/20/2020   Herpes zoster with ophthalmic complication 02/03/2020   Acute intractable headache 01/31/2020   Vision changes 01/31/2020   Non-intractable vomiting 01/31/2020   Eye pain, right 01/31/2020   Plantar fasciitis, left 01/24/2020   Diarrhea 11/08/2019   OSA (obstructive sleep apnea) 09/20/2019   Hyperlipidemia 08/18/2019   Non-restorative sleep 08/17/2019   Snoring 08/17/2019   Class 2 obesity due to excess calories without serious comorbidity with body mass index (BMI) of 36.0 to 36.9 in adult 08/17/2019   Cervical spondylosis 05/10/2019   Right shoulder pain 04/12/2019   SOB (shortness of breath) on exertion 01/11/2019   Depressed mood 01/11/2019   Osteopenia 03/17/2018   Chronic venous stasis 12/22/2017   Muscle cramp 12/22/2017   Edema of left ankle 12/22/2017   Reactive airway disease 06/09/2017   Former smoker 06/09/2017   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   Contusion 11/13/2015   Essential hypertension, benign 08/28/2015   Hiatal hernia 12/09/2013   Anxiety state 11/02/2013   GERD (gastroesophageal reflux disease) 11/02/2013    PCP: [name], PA-C  REFERRING PROVIDER: Dr Rodney Langton   REFERRING DIAG: Nontraumatic complete tear of R RC   THERAPY DIAG:  Acute pain of right shoulder  Other symptoms and signs involving the musculoskeletal system  Muscle weakness (generalized)  Abnormal posture  Rationale for  Evaluation and Treatment: Rehabilitation  ONSET DATE: 02/02/23  SUBJECTIVE:                                                                                                                                                                                      SUBJECTIVE STATEMENT: Patient reports that she has a torn rotator cuff with no known injury. She has had pain in the R arm for the past 2-3 months. Xray showed arthritic  changes. Dr T did an injection in R shoulder and saw a torn rotator cuff. The injection helped some with the pain but she now notices swelling in the shoulder area. She has pain and a catching when she uses her arm.    Hand dominance: Right  PERTINENT HISTORY: HTN; asthma; COPD; allergies; anxiety; headaches   PAIN:  Are you having pain? Yes: NPRS scale: 5/10 Pain location: R shoulder - upper trap and proximal arm Pain description: deep aching  Aggravating factors: use of R UE  Relieving factors: not using arm; rest; heat helps a little  PRECAUTIONS: Shoulder  RED FLAGS: None   WEIGHT BEARING RESTRICTIONS: No  FALLS:  Has patient fallen in last 6 months? 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  DIAGNOSTIC FINDINGS:  Xray 9/24 - R shoulder - Subjective osteopenia/osteoporosis. Otherwise negative radiographs of the right shoulder.  PATIENT SURVEYS:  FOTO 42; goal 10  COGNITION: Overall cognitive status: Within functional limits for tasks assessed     SENSATION: WFL  POSTURE: Patient presents with head forward posture with increased thoracic kyphosis; shoulders rounded and elevated; scapulae abducted and rotated along the thoracic spine; head of the humerus anterior in orientation.   UPPER EXTREMITY ROM:   Active ROM Right eval Left eval  Shoulder flexion 58* 132  Shoulder extension 57 47  Shoulder abduction 72* 124  Shoulder adduction    Shoulder internal rotation Thumb L3* Thumb T10  Shoulder external rotation 47* 49           Cervical flexion 62   Cervical extension 20   Lateral cervical flexion R 35   Lateral cervical flexion L 32 tight   Cervical rotation R 61   Cervical rotation L  54 tight    (Blank rows = not tested)  UPPER EXTREMITY  MMT:  painful *  MMT Right eval Left eval  Shoulder flexion 3- 4-  Shoulder extension 4 4+  Shoulder abduction 2+ 4  Shoulder adduction    Shoulder internal rotation 2+ 4+  Shoulder external rotation 3+ 4+  Middle trapezius    Lower trapezius    Elbow flexion    Elbow extension    Wrist flexion    Wrist extension    Wrist ulnar deviation    Wrist radial deviation    Wrist pronation    Wrist supination    Grip strength (lbs)    (Blank rows = not tested)   PALPATION:  Muscular tightness upper trap; pecs; anterior and posterior shoulder; anterior arm into the ant deltoid, biceps;    [facility] Adult PT Treatment:                                                DATE: 05/13/23 Therapeutic Exercise: Standing Chin tuck with noodle 5 sec x 5 Scap squeeze with noodle 5 sec x 10 Scap squeeze with ER elbows 90 deg noodle 3 sec x 10   Shoulder abduction isometric at wall 5 sec x 5  Sitting  Lateral cervical flexion 5 sec x 3 Isometric IR with massage stick to provide resistance 5 sec x 10  Modalities: TENS R shoulder girdle x 10 min Moist heat R shoulder x 10 min   PATIENT EDUCATION: Education details: POC; HEP Person educated: Patient Education method: Programmer, multimedia, Demonstration, Tactile cues, Verbal cues, and Handouts Education comprehension: verbalized understanding, returned demonstration, verbal cues required, tactile cues required, and needs further education  HOME EXERCISE PROGRAM: Access Code: [email or web address] URL: [email or web address] Date: 05/13/2023 Prepared by: Corlis Leak  Exercises - Seated Scapular Retraction  - 2 x daily - 7 x weekly - 1-2 sets - 10 reps - 10 sec  hold - Seated Cervical Retraction  - 2 x daily - 7 x weekly - 1-2 sets  - 5-10 reps - 10 sec  hold - Shoulder External Rotation and Scapular Retraction  - 2 x daily - 7 x weekly - 1 sets - 10 reps - 3-5 sec   hold - Isometric Shoulder Abduction at Wall  - 2 x daily - 7 x weekly - 1 sets - 5-10 reps - 5 sec  hold - Seated Isometric Shoulder Internal Rotation with Towel  - 2 x daily - 7 x weekly - 1 sets - 10 reps - 3 sec  hold - Seated Cervical Sidebending AROM  - 2 x daily - 7 x weekly - 1 sets - 5 reps - 5-10 sec  hold  Patient Education - TENS Unit  ASSESSMENT:  CLINICAL IMPRESSION: Patient is a 72 y.o. female who was seen today for physical therapy evaluation and treatment for nontraumatic complete tear of R RC. She presents with R shoulder pain and decreased function. She has poor posture and alignment; limited cervical and shoulder ROM, mobility, strength, function. Patient has pain with use of R UE. She will benefit from PT to address problems identified.   OBJECTIVE IMPAIRMENTS: decreased activity tolerance, decreased mobility, decreased ROM, decreased strength, increased fascial restrictions, impaired UE functional use, improper body mechanics, postural dysfunction, obesity, and pain.   ACTIVITY LIMITATIONS: carrying, lifting, sleeping, bathing, toileting, dressing, reach over head, and hygiene/grooming  PARTICIPATION LIMITATIONS: meal prep, cleaning, laundry, driving, and shopping  PERSONAL FACTORS: Age, Behavior pattern, Education, Fitness, Past/current experiences, and Time since onset of injury/illness/exacerbation are also affecting patient's functional outcome.   REHAB POTENTIAL: Good  CLINICAL DECISION MAKING: Stable/uncomplicated  EVALUATION COMPLEXITY: Low   GOALS: Goals reviewed with patient? Yes  SHORT TERM GOALS: Target date: 06/24/2023   Independent in initial HEP  Baseline: Goal status: INITIAL  2.  Increase AROM R shoulder flexion to 90 deg Baseline:  Goal status: INITIAL  LONG TERM GOALS: Target date:  08/05/2023  Decrease pain R shoulder 50-70% allowing patient to return to more normal functional use R UE  Baseline:  Goal status: INITIAL  2.  Increase AROM R shoulder elevation to 100 deg flexion; 90 deg abduction  Baseline:  Goal status: INITIAL  3.  Increase strength R UE to 4/5 throughout  Baseline:  Goal status: INITIAL  4.  Improve posture and alignment with patient demonstrating engagement of posterior shoulder girdle  Baseline:  Goal status: INITIAL  5.  Independent in HEP, including aquatic program as indicated  Baseline:  Goal status: INITIAL  6.  Improve functional limitation score to 58 Baseline: 42 Goal status: INITIAL  PLAN:  PT FREQUENCY: 2x/week  PT DURATION: 12 weeks  PLANNED INTERVENTIONS: 97110-Therapeutic exercises, 97530- Therapeutic activity, 97112- Neuromuscular re-education, 97535- Self Care, 40981- Manual therapy, [id number]- Aquatic Therapy, 97014- Electrical stimulation (unattended), 97016- Vasopneumatic device, [id number]- Ultrasound, [id number]- Ionotophoresis 4mg /ml Dexamethasone, Patient/Family education, Taping, Dry Needling, Joint mobilization, Spinal mobilization, Cryotherapy, and Moist heat  PLAN FOR NEXT SESSION: review and progress exercise; postural correction and strengthening; manual work, DN, modalities as indicated    [employer], PT 05/13/2023, 10:50 AM

## 2023-05-12 NOTE — Telephone Encounter (Signed)
I faxed the order. To [employer].

## 2023-05-13 ENCOUNTER — Other Ambulatory Visit: Payer: Self-pay

## 2023-05-13 ENCOUNTER — Ambulatory Visit: Payer: Medicare HMO | Attending: Sports Medicine | Admitting: Rehabilitative and Restorative Service Providers"

## 2023-05-13 ENCOUNTER — Encounter: Payer: Self-pay | Admitting: Rehabilitative and Restorative Service Providers"

## 2023-05-13 DIAGNOSIS — M6281 Muscle weakness (generalized): Secondary | ICD-10-CM | POA: Insufficient documentation

## 2023-05-13 DIAGNOSIS — M25511 Pain in right shoulder: Secondary | ICD-10-CM | POA: Diagnosis present

## 2023-05-13 DIAGNOSIS — R293 Abnormal posture: Secondary | ICD-10-CM | POA: Insufficient documentation

## 2023-05-13 DIAGNOSIS — R29898 Other symptoms and signs involving the musculoskeletal system: Secondary | ICD-10-CM | POA: Diagnosis not present

## 2023-05-15 ENCOUNTER — Ambulatory Visit: Payer: Medicare HMO

## 2023-05-20 ENCOUNTER — Ambulatory Visit: Payer: Medicare HMO

## 2023-05-20 DIAGNOSIS — R293 Abnormal posture: Secondary | ICD-10-CM | POA: Diagnosis not present

## 2023-05-20 DIAGNOSIS — R29898 Other symptoms and signs involving the musculoskeletal system: Secondary | ICD-10-CM | POA: Diagnosis not present

## 2023-05-20 DIAGNOSIS — M6281 Muscle weakness (generalized): Secondary | ICD-10-CM | POA: Diagnosis not present

## 2023-05-20 DIAGNOSIS — M25511 Pain in right shoulder: Secondary | ICD-10-CM

## 2023-05-20 NOTE — Therapy (Signed)
OUTPATIENT PHYSICAL THERAPY SHOULDER TREATMENT   Patient Name: Ana King MRN: [id number] DOB:December 25, 1950, 72 y.o., female Today's Date: 05/20/2023  END OF SESSION:  PT End of Session - 05/20/23 0955     Visit Number 2    Number of Visits 24    Date for PT Re-Evaluation 08/05/23    Authorization Type Francine Graven auth required $25 copay    Authorization Time Period 12 VISITS APPROVED FOR PT 05/13/2023-08/05/2023    Authorization - Visit Number 2    Authorization - Number of Visits 12    Progress Note Due on Visit 10    PT Start Time 0958    PT Stop Time 1102    PT Time Calculation (min) 64 min    Activity Tolerance Patient tolerated treatment well    Behavior During Therapy [facility] for tasks assessed/performed            Past Medical History:  Diagnosis Date   Asthma    Essential hypertension, benign 08/28/2015   GERD (gastroesophageal reflux disease)    Past Surgical History:  Procedure Laterality Date   ABDOMINAL HYSTERECTOMY     Patient Active Problem List   Diagnosis Date Noted   Rotator cuff tear, right 04/22/2023   Excessive daytime sleepiness 03/17/2023   Fatigue 03/17/2023   GAD (generalized anxiety disorder) 03/10/2023   Anxiety attack 03/10/2023   Atypical chest pain 02/10/2023   Chronic obstructive pulmonary disease (HCC) 02/10/2023   Mild intermittent asthma with exacerbation 02/04/2023   Pain of great toe, left 12/12/2022   Gout 09/05/2022   Great toe pain, right 09/03/2022   Pruritus 08/05/2022   Chronic allergic rhinitis 07/08/2022   Migraine without aura and without status migrainosus, not intractable 03/28/2022   Seasonal allergic rhinitis due to pollen 12/24/2021   Seasonal allergies 12/24/2021   Decreased GFR 12/24/2021   Injury of right leg 10/14/2021   Bilateral leg edema 07/30/2021   Left ear impacted cerumen 07/30/2021   Vertigo 07/30/2021   Acute pain of left shoulder 06/03/2021   Coronary artery calcification seen on CT scan 04/17/2021    Aortic atherosclerosis (HCC) 04/17/2021   Centrilobular emphysema (HCC) 04/17/2021   Grade I diastolic dysfunction 02/26/2021   CKD (chronic kidney disease) stage 3, GFR 30-59 ml/min (HCC) 02/20/2021   AKI (acute kidney injury) (HCC) 02/21/2020   Hyponatremia 02/20/2020   Hypocalcemia 02/20/2020   Low hemoglobin 02/20/2020   Herpes zoster with ophthalmic complication 02/03/2020   Acute intractable headache 01/31/2020   Vision changes 01/31/2020   Non-intractable vomiting 01/31/2020   Eye pain, right 01/31/2020   Plantar fasciitis, left 01/24/2020   Diarrhea 11/08/2019   OSA (obstructive sleep apnea) 09/20/2019   Hyperlipidemia 08/18/2019   Non-restorative sleep 08/17/2019   Snoring 08/17/2019   Class 2 obesity due to excess calories without serious comorbidity with body mass index (BMI) of 36.0 to 36.9 in adult 08/17/2019   Cervical spondylosis 05/10/2019   Right shoulder pain 04/12/2019   SOB (shortness of breath) on exertion 01/11/2019   Depressed mood 01/11/2019   Osteopenia 03/17/2018   Chronic venous stasis 12/22/2017   Muscle cramp 12/22/2017   Edema of left ankle 12/22/2017   Reactive airway disease 06/09/2017   Former smoker 06/09/2017   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   Contusion 11/13/2015   Essential hypertension, benign 08/28/2015   Hiatal hernia 12/09/2013   Anxiety state 11/02/2013   GERD (gastroesophageal reflux disease) 11/02/2013    PCP: [name], PA-C  REFERRING PROVIDER: Dr Rodney Langton  REFERRING DIAG: Nontraumatic complete tear of R RC   THERAPY DIAG:  Acute pain of right shoulder  Other symptoms and signs involving the musculoskeletal system  Muscle weakness (generalized)  Abnormal posture  Rationale for Evaluation and Treatment: Rehabilitation  ONSET DATE: 02/02/23  SUBJECTIVE:                                                                                                                                                                                       SUBJECTIVE STATEMENT:  Patient reports 4-5/10 pain in R shoulder; states pain is worse when lifting arm to the side and behind.  Hand dominance: Right  PERTINENT HISTORY: HTN; asthma; COPD; allergies; anxiety; headaches   PAIN:  Are you having pain? Yes: NPRS scale: 5/10 Pain location: R shoulder - upper trap and proximal arm Pain description: deep aching  Aggravating factors: use of R UE  Relieving factors: not using arm; rest; heat helps a little  PRECAUTIONS: Shoulder  RED FLAGS: None   WEIGHT BEARING RESTRICTIONS: No  FALLS:  Has patient fallen in last 6 months? 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  DIAGNOSTIC FINDINGS:  Xray 9/24 - R shoulder - Subjective osteopenia/osteoporosis. Otherwise negative radiographs of the right shoulder.  PATIENT SURVEYS:  FOTO 42; goal 39  COGNITION: Overall cognitive status: Within functional limits for tasks assessed     SENSATION: WFL  POSTURE: Patient presents with head forward posture with increased thoracic kyphosis; shoulders rounded and elevated; scapulae abducted and rotated along the thoracic spine; head of the humerus anterior in orientation.   UPPER EXTREMITY ROM:   Active ROM Right eval Left eval  Shoulder flexion 58* 132  Shoulder extension 57 47  Shoulder abduction 72* 124  Shoulder adduction    Shoulder internal rotation Thumb L3* Thumb T10  Shoulder external rotation 47* 49          Cervical flexion 62   Cervical extension 20   Lateral  cervical flexion R 35   Lateral cervical flexion L 32 tight   Cervical rotation R 61   Cervical rotation L  54 tight    (Blank rows = not tested)  UPPER EXTREMITY MMT:  painful *  MMT Right eval Left eval  Shoulder flexion 3- 4-  Shoulder extension 4 4+  Shoulder abduction 2+ 4  Shoulder adduction    Shoulder internal rotation 2+ 4+  Shoulder external rotation 3+ 4+  Middle trapezius    Lower trapezius    Elbow flexion    Elbow extension    Wrist flexion    Wrist extension    Wrist ulnar deviation    Wrist radial deviation    Wrist pronation    Wrist supination    Grip strength (lbs)    (Blank rows = not tested)   PALPATION:  Muscular tightness upper trap; pecs; anterior and posterior shoulder; anterior arm into the ant deltoid, biceps;     [facility] Adult PT Treatment:                                                DATE: 05/20/2023 Therapeutic Exercise: Standing with noodle: Scap squeezes 10x5" Chin tuck 6x5" L arms 10x3" (towel underneath R arm) Shoulder abd isometric 10x5" Shoulder IR isometric 10x5" Pendulums --> lateral, circles Seated with big noodle horiz at back --> thoracic extension stretch back (hands braced on thighs) x10 AAROM (R) with cane 10x3" Supine with noodle horiz at back: Low arm pec stretch x Chest press with dowel x5 Shoulder flexion with 1#dowel x10 Supine with noodle vertical at back: Low arm pec stretch x S/L scaption arm raises to 90 deg --> scap rotation assist Modalities: TENS R shoulder girdle + moist heat x 15 min    [facility] Adult PT Treatment:                                                DATE: 05/13/23 Therapeutic Exercise: Standing Chin tuck with noodle 5 sec x 5 Scap squeeze with noodle 5 sec x 10 Scap squeeze with ER elbows 90 deg noodle 3 sec x 10   Shoulder abduction isometric at wall 5 sec x 5  Sitting  Lateral cervical flexion 5 sec x 3 Isometric IR with massage stick to provide resistance 5 sec x 10   Modalities: TENS R shoulder girdle x 10 min Moist heat R shoulder x 10 min   PATIENT EDUCATION: Education details: POC; HEP Person educated: Patient Education method: Programmer, multimedia, Demonstration, Tactile cues, Verbal cues, and Handouts Education comprehension: verbalized understanding, returned demonstration, verbal cues required, tactile cues required, and needs further education  HOME EXERCISE PROGRAM: Access Code: [email or web address] URL: [email or web address] Date: 05/13/2023 Prepared by: Corlis Leak  Exercises - Seated Scapular Retraction  - 2 x daily - 7 x weekly - 1-2 sets - 10 reps - 10 sec  hold - Seated Cervical Retraction  - 2 x daily - 7 x weekly - 1-2 sets - 5-10 reps - 10 sec  hold - Shoulder External Rotation and Scapular Retraction  - 2 x daily - 7 x weekly - 1 sets - 10 reps - 3-5 sec   hold - Isometric Shoulder Abduction at Wall  - 2 x daily - 7 x weekly - 1 sets - 5-10 reps - 5 sec  hold - Seated Isometric Shoulder Internal Rotation with Towel  - 2 x daily - 7 x weekly - 1 sets - 10 reps - 3 sec  hold - Seated Cervical Sidebending  AROM  - 2 x daily - 7 x weekly - 1 sets - 5 reps - 5-10 sec  hold  Patient Education - TENS Unit  ASSESSMENT:  CLINICAL IMPRESSION: Postural exercises continued, progressing shoulder mobility AROM mobility as tolerated. Modifying arm raises in side lying to scaption decreased shoulder pain. Thoracic mobility added with exercise variations in sitting and supine. Tactile cues provided to improve shoulder alignment and scapula stability during shoulder abd isometrics decreased pain.   EVAL: Patient is a 72 y.o. female who was seen today for physical therapy evaluation and treatment for nontraumatic complete tear of R RC. She presents with R shoulder pain and decreased function. She has poor posture and alignment; limited cervical and shoulder ROM, mobility, strength, function. Patient has pain with use of R UE. She will benefit from PT to  address problems identified.   OBJECTIVE IMPAIRMENTS: decreased activity tolerance, decreased mobility, decreased ROM, decreased strength, increased fascial restrictions, impaired UE functional use, improper body mechanics, postural dysfunction, obesity, and pain.   ACTIVITY LIMITATIONS: carrying, lifting, sleeping, bathing, toileting, dressing, reach over head, and hygiene/grooming  PARTICIPATION LIMITATIONS: meal prep, cleaning, laundry, driving, and shopping  PERSONAL FACTORS: Age, Behavior pattern, Education, Fitness, Past/current experiences, and Time since onset of injury/illness/exacerbation are also affecting patient's functional outcome.   REHAB POTENTIAL: Good  CLINICAL DECISION MAKING: Stable/uncomplicated  EVALUATION COMPLEXITY: Low   GOALS: Goals reviewed with patient? Yes  SHORT TERM GOALS: Target date: 06/24/2023  Independent in initial HEP  Baseline: Goal status: INITIAL  2.  Increase AROM R shoulder flexion to 90 deg Baseline:  Goal status: INITIAL  LONG TERM GOALS: Target date: 08/05/2023  Decrease pain R shoulder 50-70% allowing patient to return to more normal functional use R UE  Baseline:  Goal status: INITIAL  2.  Increase AROM R shoulder elevation to 100 deg flexion; 90 deg abduction  Baseline:  Goal status: INITIAL  3.  Increase strength R UE to 4/5 throughout  Baseline:  Goal status: INITIAL  4.  Improve posture and alignment with patient demonstrating engagement of posterior shoulder girdle  Baseline:  Goal status: INITIAL  5.  Independent in HEP, including aquatic program as indicated  Baseline:  Goal status: INITIAL  6.  Improve functional limitation score to 58 Baseline: 42 Goal status: INITIAL  PLAN:  PT FREQUENCY: 2x/week  PT DURATION: 12 weeks  PLANNED INTERVENTIONS: 97110-Therapeutic exercises, 97530- Therapeutic activity, 97112- Neuromuscular re-education, 97535- Self Care, 82956- Manual therapy, [id number]- Aquatic Therapy,  97014- Electrical stimulation (unattended), 97016- Vasopneumatic device, 97035- Ultrasound, 21308- Ionotophoresis 4mg /ml Dexamethasone, Patient/Family education, Taping, Dry Needling, Joint mobilization, Spinal mobilization, Cryotherapy, and Moist heat  PLAN FOR NEXT SESSION: review and progress exercise; postural correction and strengthening; manual work, DN, modalities as indicated    Sanjuana Mae, PTA 05/20/2023, 10:56 AM

## 2023-05-21 ENCOUNTER — Ambulatory Visit: Payer: Medicare HMO

## 2023-05-21 DIAGNOSIS — R29898 Other symptoms and signs involving the musculoskeletal system: Secondary | ICD-10-CM | POA: Diagnosis not present

## 2023-05-21 DIAGNOSIS — Z122 Encounter for screening for malignant neoplasm of respiratory organs: Secondary | ICD-10-CM | POA: Diagnosis not present

## 2023-05-21 DIAGNOSIS — R293 Abnormal posture: Secondary | ICD-10-CM

## 2023-05-21 DIAGNOSIS — M6281 Muscle weakness (generalized): Secondary | ICD-10-CM

## 2023-05-21 DIAGNOSIS — M25511 Pain in right shoulder: Secondary | ICD-10-CM

## 2023-05-21 DIAGNOSIS — Z87891 Personal history of nicotine dependence: Secondary | ICD-10-CM

## 2023-05-21 NOTE — Therapy (Signed)
OUTPATIENT PHYSICAL THERAPY SHOULDER TREATMENT   Patient Name: Ana King MRN: [id number] DOB:August 24, 1950, 72 y.o., female Today's Date: 05/21/2023  END OF SESSION:  PT End of Session - 05/21/23 1015     Visit Number 3    Number of Visits 24    Date for PT Re-Evaluation 08/05/23    Authorization Type Francine Graven auth required $25 copay    Authorization Time Period 12 VISITS APPROVED FOR PT 05/13/2023-08/05/2023    Authorization - Visit Number 3    Authorization - Number of Visits 12    Progress Note Due on Visit 10    PT Start Time 1015    PT Stop Time 1108    PT Time Calculation (min) 53 min    Activity Tolerance Patient tolerated treatment well    Behavior During Therapy WFL for tasks assessed/performed            Past Medical History:  Diagnosis Date   Asthma    Essential hypertension, benign 08/28/2015   GERD (gastroesophageal reflux disease)    Past Surgical History:  Procedure Laterality Date   ABDOMINAL HYSTERECTOMY     Patient Active Problem List   Diagnosis Date Noted   Rotator cuff tear, right 04/22/2023   Excessive daytime sleepiness 03/17/2023   Fatigue 03/17/2023   GAD (generalized anxiety disorder) 03/10/2023   Anxiety attack 03/10/2023   Atypical chest pain 02/10/2023   Chronic obstructive pulmonary disease (HCC) 02/10/2023   Mild intermittent asthma with exacerbation 02/04/2023   Pain of great toe, left 12/12/2022   Gout 09/05/2022   Great toe pain, right 09/03/2022   Pruritus 08/05/2022   Chronic allergic rhinitis 07/08/2022   Migraine without aura and without status migrainosus, not intractable 03/28/2022   Seasonal allergic rhinitis due to pollen 12/24/2021   Seasonal allergies 12/24/2021   Decreased GFR 12/24/2021   Injury of right leg 10/14/2021   Bilateral leg edema 07/30/2021   Left ear impacted cerumen 07/30/2021   Vertigo 07/30/2021   Acute pain of left shoulder 06/03/2021   Coronary artery calcification seen on CT scan 04/17/2021    Aortic atherosclerosis (HCC) 04/17/2021   Centrilobular emphysema (HCC) 04/17/2021   Grade I diastolic dysfunction 02/26/2021   CKD (chronic kidney disease) stage 3, GFR 30-59 ml/min (HCC) 02/20/2021   AKI (acute kidney injury) (HCC) 02/21/2020   Hyponatremia 02/20/2020   Hypocalcemia 02/20/2020   Low hemoglobin 02/20/2020   Herpes zoster with ophthalmic complication 02/03/2020   Acute intractable headache 01/31/2020   Vision changes 01/31/2020   Non-intractable vomiting 01/31/2020   Eye pain, right 01/31/2020   Plantar fasciitis, left 01/24/2020   Diarrhea 11/08/2019   OSA (obstructive sleep apnea) 09/20/2019   Hyperlipidemia 08/18/2019   Non-restorative sleep 08/17/2019   Snoring 08/17/2019   Class 2 obesity due to excess calories without serious comorbidity with body mass index (BMI) of 36.0 to 36.9 in adult 08/17/2019   Cervical spondylosis 05/10/2019   Right shoulder pain 04/12/2019   SOB (shortness of breath) on exertion 01/11/2019   Depressed mood 01/11/2019   Osteopenia 03/17/2018   Chronic venous stasis 12/22/2017   Muscle cramp 12/22/2017   Edema of left ankle 12/22/2017   Reactive airway disease 06/09/2017   Former smoker 06/09/2017   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   Contusion 11/13/2015   Essential hypertension, benign 08/28/2015   Hiatal hernia 12/09/2013   Anxiety state 11/02/2013   GERD (gastroesophageal reflux disease) 11/02/2013    PCP: [name], PA-C  REFERRING PROVIDER: Dr Rodney Langton  REFERRING DIAG: Nontraumatic complete tear of R RC   THERAPY DIAG:  Acute pain of right shoulder  Muscle weakness (generalized)  Other symptoms and signs involving the musculoskeletal system  Abnormal posture  Rationale for Evaluation and Treatment: Rehabilitation  ONSET DATE: 02/02/23  SUBJECTIVE:                                                                                                                                                                                       SUBJECTIVE STATEMENT:  Patient reports no pain currently, states today so far is a good day with the shoulder. Patient states shoulder becomes aggravated when cleaning or lifting heavy objects.  Hand dominance: Right  PERTINENT HISTORY: HTN; asthma; COPD; allergies; anxiety; headaches   PAIN:  Are you having pain? Yes: NPRS scale: 5/10 Pain location: R shoulder - upper trap and proximal arm Pain description: deep aching  Aggravating factors: use of R UE  Relieving factors: not using arm; rest; heat helps a little  PRECAUTIONS: Shoulder  RED FLAGS: None   WEIGHT BEARING RESTRICTIONS: No  FALLS:  Has patient fallen in last 6 months? 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  DIAGNOSTIC FINDINGS:  Xray 9/24 - R shoulder - Subjective osteopenia/osteoporosis. Otherwise negative radiographs of the right shoulder.  PATIENT SURVEYS:  FOTO 42; goal 61  COGNITION: Overall cognitive status: Within functional limits for tasks assessed     SENSATION: WFL  POSTURE: Patient presents with head forward posture with increased thoracic kyphosis; shoulders rounded and elevated; scapulae abducted and rotated along the thoracic spine; head of the humerus anterior in orientation.   UPPER EXTREMITY ROM:   Active ROM Right eval Left eval  Shoulder flexion 58* 132  Shoulder extension 57 47  Shoulder abduction 72* 124  Shoulder adduction    Shoulder internal rotation Thumb L3* Thumb T10  Shoulder external rotation 47* 49           Cervical flexion 62   Cervical extension 20   Lateral cervical flexion R 35   Lateral cervical flexion L 32 tight   Cervical rotation R 61   Cervical rotation L  54 tight    (Blank rows = not tested)  UPPER EXTREMITY MMT:  painful *  MMT Right  eval Left eval  Shoulder flexion 3- 4-  Shoulder extension 4 4+  Shoulder abduction 2+ 4  Shoulder adduction    Shoulder internal rotation 2+ 4+  Shoulder external rotation 3+ 4+  Middle trapezius    Lower trapezius    Elbow flexion    Elbow extension    Wrist flexion    Wrist extension    Wrist ulnar deviation    Wrist radial deviation    Wrist pronation    Wrist supination    Grip strength (lbs)    (Blank rows = not tested)   PALPATION:  Muscular tightness upper trap; pecs; anterior and posterior shoulder; anterior arm into the ant deltoid, biceps;     [facility] Adult PT Treatment:                                                DATE: 05/21/2023 Therapeutic Exercise: Standing with back to noodle: Scap squeezes x10 L arms x10 Chin tucks + L finger cue x10 Shoulder abd isometric x10 Bkwd shoulder rolls Shoulder ER isometric x10 Standing rows RTB x10 --> GTB x10 Shoulder IR stretch with cane x10 Supine, noodle horiz at upper back --> shoulder flexion + 1# dowel Supine passive pec stretch x Modalities: TENS R shoulder girdle + moist heat x 15 min    [facility] Adult PT Treatment:                                                DATE: 05/20/2023 Therapeutic Exercise: Standing with noodle: Scap squeezes 10x5" Chin tuck 6x5" L arms 10x3" (towel underneath R arm) Shoulder abd isometric 10x5" Shoulder IR isometric 10x5" Pendulums --> lateral, circles Seated with big noodle horiz at back --> thoracic extension stretch back (hands braced on thighs) x10 AAROM (R) with cane 10x3" Supine with noodle horiz at back: Low arm pec stretch x Chest press with dowel x5 Shoulder flexion with 1#dowel x10 Supine with noodle  vertical at back: Low arm pec stretch x S/L scaption arm raises to 90 deg --> scap rotation assist Modalities: TENS R shoulder girdle + moist heat x 15 min    [facility] Adult PT Treatment:                                                DATE: 05/13/23 Therapeutic Exercise: Standing Chin tuck with noodle 5 sec x 5 Scap squeeze with noodle 5 sec x 10 Scap squeeze with ER elbows 90 deg noodle 3 sec x 10   Shoulder abduction isometric at wall 5 sec x 5  Sitting  Lateral cervical flexion 5 sec x 3 Isometric IR with massage stick to provide resistance 5 sec x 10  Modalities: TENS R shoulder girdle x 10 min Moist heat R shoulder x 10 min   PATIENT EDUCATION: Education details: POC; HEP Person educated: Patient Education method: Programmer, multimedia, Demonstration, Tactile cues, Verbal cues, and Handouts Education comprehension: verbalized understanding, returned demonstration, verbal cues required, tactile cues required, and needs further education  HOME EXERCISE PROGRAM: Access Code: [email or web address] URL: [email or web address] Date: 05/13/2023 Prepared by:  Celyn Holt  Exercises - Seated Scapular Retraction  - 2 x daily - 7 x weekly - 1-2 sets - 10 reps - 10 sec  hold - Seated Cervical Retraction  - 2 x daily - 7 x weekly - 1-2 sets - 5-10 reps - 10 sec  hold - Shoulder External Rotation and Scapular Retraction  - 2 x daily - 7 x weekly - 1 sets - 10 reps - 3-5 sec   hold - Isometric Shoulder Abduction at Wall  - 2 x daily - 7 x weekly - 1 sets - 5-10 reps - 5 sec  hold - Seated Isometric Shoulder Internal Rotation with Towel  - 2 x daily - 7 x weekly - 1 sets - 10 reps - 3 sec  hold - Seated Cervical Sidebending AROM  - 2 x daily - 7 x weekly - 1 sets - 5 reps - 5-10 sec  hold  Patient Education - TENS Unit  ASSESSMENT:  CLINICAL IMPRESSION: Patient had difficulty maintaining scapula retraction stabilization during postural exercises and required frequent cueing to reinforce  postural awareness. Increased pain with shoulder abd isometrics. Cueing patient in pain-free range improved tolerance with shoulder IR stretches.  EVAL: Patient is a 72 y.o. female who was seen today for physical therapy evaluation and treatment for nontraumatic complete tear of R RC. She presents with R shoulder pain and decreased function. She has poor posture and alignment; limited cervical and shoulder ROM, mobility, strength, function. Patient has pain with use of R UE. She will benefit from PT to address problems identified.   OBJECTIVE IMPAIRMENTS: decreased activity tolerance, decreased mobility, decreased ROM, decreased strength, increased fascial restrictions, impaired UE functional use, improper body mechanics, postural dysfunction, obesity, and pain.   ACTIVITY LIMITATIONS: carrying, lifting, sleeping, bathing, toileting, dressing, reach over head, and hygiene/grooming  PARTICIPATION LIMITATIONS: meal prep, cleaning, laundry, driving, and shopping  PERSONAL FACTORS: Age, Behavior pattern, Education, Fitness, Past/current experiences, and Time since onset of injury/illness/exacerbation are also affecting patient's functional outcome.   REHAB POTENTIAL: Good  CLINICAL DECISION MAKING: Stable/uncomplicated  EVALUATION COMPLEXITY: Low   GOALS: Goals reviewed with patient? Yes  SHORT TERM GOALS: Target date: 06/24/2023  Independent in initial HEP  Baseline: Goal status: INITIAL  2.  Increase AROM R shoulder flexion to 90 deg Baseline:  Goal status: INITIAL  LONG TERM GOALS: Target date: 08/05/2023  Decrease pain R shoulder 50-70% allowing patient to return to more normal functional use R UE  Baseline:  Goal status: INITIAL  2.  Increase AROM R shoulder elevation to 100 deg flexion; 90 deg abduction  Baseline:  Goal status: INITIAL  3.  Increase strength R UE to 4/5 throughout  Baseline:  Goal status: INITIAL  4.  Improve posture and alignment with patient  demonstrating engagement of posterior shoulder girdle  Baseline:  Goal status: INITIAL  5.  Independent in HEP, including aquatic program as indicated  Baseline:  Goal status: INITIAL  6.  Improve functional limitation score to 58 Baseline: 42 Goal status: INITIAL  PLAN:  PT FREQUENCY: 2x/week  PT DURATION: 12 weeks  PLANNED INTERVENTIONS: 97110-Therapeutic exercises, 97530- Therapeutic activity, [id number]- Neuromuscular re-education, 97535- Self Care, [id number]- Manual therapy, [id number]- Aquatic Therapy, 97014- Electrical stimulation (unattended), 97016- Vasopneumatic device, [id number]- Ultrasound, 13244- Ionotophoresis 4mg /ml Dexamethasone, Patient/Family education, Taping, Dry Needling, Joint mobilization, Spinal mobilization, Cryotherapy, and Moist heat  PLAN FOR NEXT SESSION: review and progress exercise; postural correction and strengthening; manual work, DN, modalities as indicated  Sanjuana Mae, PTA 05/21/2023, 10:58 AM

## 2023-05-27 ENCOUNTER — Ambulatory Visit: Payer: Medicare HMO | Attending: Sports Medicine | Admitting: Rehabilitative and Restorative Service Providers"

## 2023-05-27 ENCOUNTER — Encounter: Payer: Self-pay | Admitting: Rehabilitative and Restorative Service Providers"

## 2023-05-27 DIAGNOSIS — R42 Dizziness and giddiness: Secondary | ICD-10-CM | POA: Insufficient documentation

## 2023-05-27 DIAGNOSIS — M25511 Pain in right shoulder: Secondary | ICD-10-CM | POA: Diagnosis present

## 2023-05-27 DIAGNOSIS — R293 Abnormal posture: Secondary | ICD-10-CM | POA: Insufficient documentation

## 2023-05-27 DIAGNOSIS — R2689 Other abnormalities of gait and mobility: Secondary | ICD-10-CM | POA: Diagnosis present

## 2023-05-27 DIAGNOSIS — M6281 Muscle weakness (generalized): Secondary | ICD-10-CM | POA: Diagnosis present

## 2023-05-27 DIAGNOSIS — R29898 Other symptoms and signs involving the musculoskeletal system: Secondary | ICD-10-CM | POA: Insufficient documentation

## 2023-05-27 NOTE — Therapy (Signed)
OUTPATIENT PHYSICAL THERAPY SHOULDER TREATMENT   Patient Name: Ana King MRN: [id number] DOB:[birth year], 72 y.o., female Today's Date: 05/27/2023  END OF SESSION:  PT End of Session - 05/27/23 1014     Visit Number 4    Number of Visits 24    Date for PT Re-Evaluation 08/05/23    Authorization Type Francine [employer] auth required $25 copay    Authorization Time Period 12 VISITS APPROVED FOR PT 05/13/2023-08/05/2023    Authorization - Visit Number 4    Authorization - Number of Visits 12    Progress Note Due on Visit 10    PT Start Time 1014    PT Stop Time 1102    PT Time Calculation (min) 48 min    Activity Tolerance Patient tolerated treatment well            Past Medical History:  Diagnosis Date   Asthma    Essential hypertension, benign 08/28/2015   GERD (gastroesophageal reflux disease)    Past Surgical History:  Procedure Laterality Date   ABDOMINAL HYSTERECTOMY     Patient Active Problem List   Diagnosis Date Noted   Rotator cuff tear, right 04/22/2023   Excessive daytime sleepiness 03/17/2023   Fatigue 03/17/2023   GAD (generalized anxiety disorder) 03/10/2023   Anxiety attack 03/10/2023   Atypical chest pain 02/10/2023   Chronic obstructive pulmonary disease (HCC) 02/10/2023   Mild intermittent asthma with exacerbation 02/04/2023   Pain of great toe, left 12/12/2022   Gout 09/05/2022   Great toe pain, right 09/03/2022   Pruritus 08/05/2022   Chronic allergic rhinitis 07/08/2022   Migraine without aura and without status migrainosus, not intractable 03/28/2022   Seasonal allergic rhinitis due to pollen 12/24/2021   Seasonal allergies 12/24/2021   Decreased GFR 12/24/2021   Injury of right leg 10/14/2021   Bilateral leg edema 07/30/2021   Left ear impacted cerumen 07/30/2021   Vertigo 07/30/2021   Acute pain of left shoulder 06/03/2021   Coronary artery calcification seen on CT scan 04/17/2021   Aortic atherosclerosis (HCC) 04/17/2021   Centrilobular  emphysema (HCC) 04/17/2021   Grade I diastolic dysfunction 02/26/2021   CKD (chronic kidney disease) stage 3, GFR 30-59 ml/min (HCC) 02/20/2021   AKI (acute kidney injury) (HCC) 02/21/2020   Hyponatremia 02/20/2020   Hypocalcemia 02/20/2020   Low hemoglobin 02/20/2020   Herpes zoster with ophthalmic complication 02/03/2020   Acute intractable headache 01/31/2020   Vision changes 01/31/2020   Non-intractable vomiting 01/31/2020   Eye pain, right 01/31/2020   Plantar fasciitis, left 01/24/2020   Diarrhea 11/08/2019   OSA (obstructive sleep apnea) 09/20/2019   Hyperlipidemia 08/18/2019   Non-restorative sleep 08/17/2019   Snoring 08/17/2019   Class 2 obesity due to excess calories without serious comorbidity with body mass index (BMI) of 36.0 to 36.9 in adult 08/17/2019   Cervical spondylosis 05/10/2019   Right shoulder pain 04/12/2019   SOB (shortness of breath) on exertion 01/11/2019   Depressed mood 01/11/2019   Osteopenia 03/17/2018   Chronic venous stasis 12/22/2017   Muscle cramp 12/22/2017   Edema of left ankle 12/22/2017   Reactive airway disease 06/09/2017   Former smoker 06/09/2017   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   Contusion 11/13/2015   Essential hypertension, benign 08/28/2015   Hiatal hernia 12/09/2013   Anxiety state 11/02/2013   GERD (gastroesophageal reflux disease) 11/02/2013    PCP: [name], PA-C  REFERRING PROVIDER: Dr Rodney Langton   REFERRING DIAG: Nontraumatic complete tear of R RC  THERAPY DIAG:  Acute pain of right shoulder  Muscle weakness (generalized)  Other symptoms and signs involving the musculoskeletal system  Abnormal posture  Rationale for Evaluation and Treatment: Rehabilitation  ONSET DATE: 02/02/23  SUBJECTIVE:                                                                                                                                                                                      SUBJECTIVE  STATEMENT:  Patient reports that she continues to have soreness and pain in the R arm more so than the shoulder. Exercises are going ok at home. She is borrowing TENS unit from her daughter Arline Asp and is using it every other day. Patient states shoulder becomes aggravated when cleaning or lifting heavy objects.  Hand dominance: Right  PERTINENT HISTORY: HTN; asthma; COPD; allergies; anxiety; headaches   PAIN:  Are you having pain? Yes: NPRS scale: 5/10 Pain location: R shoulder - upper trap and proximal arm Pain description: deep aching  Aggravating factors: use of R UE  Relieving factors: not using arm; rest; heat helps a little  PRECAUTIONS: Shoulder  WEIGHT BEARING RESTRICTIONS: No  FALLS:  Has patient fallen in last 6 months? 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  DIAGNOSTIC FINDINGS:  Xray 9/24 - R shoulder - Subjective osteopenia/osteoporosis. Otherwise negative radiographs of the right shoulder.  PATIENT SURVEYS:  FOTO 42; goal 46  COGNITION: Overall cognitive status: Within functional limits for tasks assessed     SENSATION: WFL  POSTURE: Patient presents with head forward posture with increased thoracic kyphosis; shoulders rounded and elevated; scapulae abducted and rotated along the thoracic spine; head of the humerus anterior in orientation.   UPPER EXTREMITY ROM:   Active ROM Right eval Left eval  Shoulder flexion 58* 132  Shoulder extension 57 47  Shoulder abduction 72* 124  Shoulder adduction    Shoulder internal rotation Thumb L3* Thumb  T10  Shoulder external rotation 47* 49          Cervical flexion 62   Cervical extension 20   Lateral cervical flexion R 35   Lateral cervical flexion L 32 tight   Cervical rotation R 61   Cervical rotation L  54 tight    (Blank rows = not tested)  UPPER EXTREMITY  MMT:  painful *  MMT Right eval Left eval  Shoulder flexion 3- 4-  Shoulder extension 4 4+  Shoulder abduction 2+ 4  Shoulder adduction    Shoulder internal rotation 2+ 4+  Shoulder external rotation 3+ 4+  Middle trapezius    Lower trapezius    Elbow flexion    Elbow extension    Wrist flexion    Wrist extension    Wrist ulnar deviation    Wrist radial deviation    Wrist pronation    Wrist supination    Grip strength (lbs)    (Blank rows = not tested)   PALPATION:  Muscular tightness upper trap; pecs; anterior and posterior shoulder; anterior arm into the ant deltoid, biceps;    [facility] Adult PT Treatment:                                                DATE: 05/27/2023 Therapeutic Exercise: Standing  Scap squeezes with noodle x10 L arms with noodle x10 W modified lower range with noodle x 10  Chin tucks with noodle 5 sec x10 Shoulder abd isometric at wall 5 sec x10 Shoulder ER isometric at wall 5 sec x10 Bkwd shoulder rolls x 15  Row green TB 3 sec x 10 x 2  Supine  Scap squeeze/chest lift 3 sec x 10  AAROM R shoulder clasping wrist with L hand 5 sec x 3  Chest press (keeping shoulder back) bamboo dowel x 10  Modalities:  Moist heat x 15 min R shoulder (patient will use TENS unit at home)    [facility] Adult PT Treatment:                                                DATE: 05/21/2023 Therapeutic Exercise: Standing with back to noodle: Scap squeezes x10 L arms x10 Chin tucks + L finger cue x10 Shoulder abd isometric x10 Bkwd shoulder rolls Shoulder ER isometric x10 Standing rows RTB x10 --> GTB x10 Shoulder IR stretch with cane x10 Supine, noodle horiz at upper back --> shoulder flexion + 1#  dowel Supine passive pec stretch x Modalities: TENS R shoulder girdle + moist heat x 15 min    [facility] Adult PT Treatment:                                                DATE: 05/20/2023 Therapeutic Exercise: Standing with noodle: Scap squeezes 10x5" Chin tuck 6x5" L arms 10x3" (towel underneath R arm) Shoulder abd isometric 10x5" Shoulder IR isometric 10x5" Pendulums --> lateral, circles Seated with big noodle horiz at back --> thoracic extension stretch back (hands braced on thighs) x10 AAROM (R) with cane 10x3" Supine with noodle horiz at back: Low arm pec stretch x Chest press with dowel x5 Shoulder flexion with 1#dowel x10 Supine with noodle vertical at back: Low arm pec stretch x S/L scaption arm raises to 90 deg --> scap rotation assist Modalities: TENS R shoulder girdle + moist heat x 15 min    [facility] Adult PT Treatment:  DATE: 05/13/23 Therapeutic Exercise: Standing Chin tuck with noodle 5 sec x 5 Scap squeeze with noodle 5 sec x 10 Scap squeeze with ER elbows 90 deg noodle 3 sec x 10   Shoulder abduction isometric at wall 5 sec x 5  Sitting  Lateral cervical flexion 5 sec x 3 Isometric IR with massage stick to provide resistance 5 sec x 10  Modalities: TENS R shoulder girdle x 10 min Moist heat R shoulder x 10 min   PATIENT EDUCATION: Education details: POC; HEP Person educated: Patient Education method: Programmer, multimedia, Demonstration, Tactile cues, Verbal cues, and Handouts Education comprehension: verbalized understanding, returned demonstration, verbal cues required, tactile cues required, and needs further education  HOME EXERCISE PROGRAM: Access Code: [email or web address] URL: [email or web address] Date: 05/27/2023 Prepared by: Corlis Leak  Exercises - Seated Scapular Retraction  - 2 x daily - 7 x weekly - 1-2 sets - 10 reps - 10 sec  hold - Seated Cervical Retraction  - 2 x daily - 7 x  weekly - 1-2 sets - 5-10 reps - 10 sec  hold - Shoulder External Rotation and Scapular Retraction  - 2 x daily - 7 x weekly - 1 sets - 10 reps - 3-5 sec   hold - Isometric Shoulder Abduction at Wall  - 2 x daily - 7 x weekly - 1 sets - 5-10 reps - 5 sec  hold - Seated Isometric Shoulder Internal Rotation with Towel  - 2 x daily - 7 x weekly - 1 sets - 10 reps - 3 sec  hold - Seated Cervical Sidebending AROM  - 2 x daily - 7 x weekly - 1 sets - 5 reps - 5-10 sec  hold - Supine Shoulder Flexion AAROM with Hands Clasped  - 1 x daily - 7 x weekly - 1 sets - 3 reps - 3-5 sec  hold - Supine Shoulder Press AAROM in Abduction with Dowel  - 1 x daily - 7 x weekly - 1 sets - 10 reps - 2 sec  hold - Isometric Shoulder Abduction at Wall  - 2 x daily - 7 x weekly - 1 sets - 5-10 reps - 5 sec  hold - Isometric Shoulder External Rotation at Wall  - 2 x daily - 7 x weekly - 1 sets - 5 reps - 5 sec  hold - Standing Bilateral Low Shoulder Row with Anchored Resistance  - 1 x daily - 7 x weekly - 1-3 sets - 10 reps - 2-3 sec  hold  Patient Education - TENS Unit  ASSESSMENT:  CLINICAL IMPRESSION: Patient reports continued pain and soreness in the proximal arm. Continued work on R UE stabilization and strengthening progressing HEP and provided TB for home. Encouraged patient to use TENS unit for pain management daily as needed. Will progress with isometric and isotonic strengthening as patient tolerates. Possible trial of isometric TB for IR/ER at next visit.    EVAL: Patient is a 73 y.o. female who was seen today for physical therapy evaluation and treatment for nontraumatic complete tear of R RC. She presents with R shoulder pain and decreased function. She has poor posture and alignment; limited cervical and shoulder ROM, mobility, strength, function. Patient has pain with use of R UE. She will benefit from PT to address problems identified.   OBJECTIVE IMPAIRMENTS: decreased activity tolerance, decreased  mobility, decreased ROM, decreased strength, increased fascial restrictions, impaired UE functional use, improper body mechanics, postural dysfunction, obesity, and pain.    GOALS: Goals  reviewed with patient? Yes  SHORT TERM GOALS: Target date: 06/24/2023  Independent in initial HEP  Baseline: Goal status: INITIAL  2.  Increase AROM R shoulder flexion to 90 deg Baseline:  Goal status: INITIAL  LONG TERM GOALS: Target date: 08/05/2023  Decrease pain R shoulder 50-70% allowing patient to return to more normal functional use R UE  Baseline:  Goal status: INITIAL  2.  Increase AROM R shoulder elevation to 100 deg flexion; 90 deg abduction  Baseline:  Goal status: INITIAL  3.  Increase strength R UE to 4/5 throughout  Baseline:  Goal status: INITIAL  4.  Improve posture and alignment with patient demonstrating engagement of posterior shoulder girdle  Baseline:  Goal status: INITIAL  5.  Independent in HEP, including aquatic program as indicated  Baseline:  Goal status: INITIAL  6.  Improve functional limitation score to 58 Baseline: 42 Goal status: INITIAL  PLAN:  PT FREQUENCY: 2x/week  PT DURATION: 12 weeks  PLANNED INTERVENTIONS: 97110-Therapeutic exercises, 97530- Therapeutic activity, 97112- Neuromuscular re-education, 97535- Self Care, [id number]- Manual therapy, [phone]- Aquatic Therapy, 97014- Electrical stimulation (unattended), 97016- Vasopneumatic device, 97035- Ultrasound, [id number]- Ionotophoresis 4mg /ml Dexamethasone, Patient/Family education, Taping, Dry Needling, Joint mobilization, Spinal mobilization, Cryotherapy, and Moist heat  PLAN FOR NEXT SESSION: review and progress exercise; postural correction and strengthening; manual work, DN, modalities as indicated    [employer], PT 05/27/2023, 10:55 AM

## 2023-05-28 ENCOUNTER — Ambulatory Visit: Payer: Medicare HMO

## 2023-05-28 DIAGNOSIS — R293 Abnormal posture: Secondary | ICD-10-CM

## 2023-05-28 DIAGNOSIS — R29898 Other symptoms and signs involving the musculoskeletal system: Secondary | ICD-10-CM

## 2023-05-28 DIAGNOSIS — M6281 Muscle weakness (generalized): Secondary | ICD-10-CM | POA: Diagnosis not present

## 2023-05-28 DIAGNOSIS — M25511 Pain in right shoulder: Secondary | ICD-10-CM

## 2023-05-28 DIAGNOSIS — R42 Dizziness and giddiness: Secondary | ICD-10-CM

## 2023-05-28 DIAGNOSIS — R2689 Other abnormalities of gait and mobility: Secondary | ICD-10-CM | POA: Diagnosis not present

## 2023-05-28 NOTE — Therapy (Signed)
OUTPATIENT PHYSICAL THERAPY SHOULDER TREATMENT   Patient Name: Ana King MRN: [id number] DOB:1951/04/28, 72 y.o., female Today's Date: 05/28/2023  END OF SESSION:  PT End of Session - 05/28/23 1017     Visit Number 5    Number of Visits 24    Date for PT Re-Evaluation 08/05/23    Authorization Type Francine Graven auth required $25 copay    Authorization Time Period 12 VISITS APPROVED FOR PT 05/13/2023-08/05/2023    Authorization - Visit Number 5    Authorization - Number of Visits 12    Progress Note Due on Visit 10    PT Start Time 1017    PT Stop Time 1105    PT Time Calculation (min) 48 min    Activity Tolerance Patient tolerated treatment well    Behavior During Therapy WFL for tasks assessed/performed            Past Medical History:  Diagnosis Date   Asthma    Essential hypertension, benign 08/28/2015   GERD (gastroesophageal reflux disease)    Past Surgical History:  Procedure Laterality Date   ABDOMINAL HYSTERECTOMY     Patient Active Problem List   Diagnosis Date Noted   Rotator cuff tear, right 04/22/2023   Excessive daytime sleepiness 03/17/2023   Fatigue 03/17/2023   GAD (generalized anxiety disorder) 03/10/2023   Anxiety attack 03/10/2023   Atypical chest pain 02/10/2023   Chronic obstructive pulmonary disease (HCC) 02/10/2023   Mild intermittent asthma with exacerbation 02/04/2023   Pain of great toe, left 12/12/2022   Gout 09/05/2022   Great toe pain, right 09/03/2022   Pruritus 08/05/2022   Chronic allergic rhinitis 07/08/2022   Migraine without aura and without status migrainosus, not intractable 03/28/2022   Seasonal allergic rhinitis due to pollen 12/24/2021   Seasonal allergies 12/24/2021   Decreased GFR 12/24/2021   Injury of right leg 10/14/2021   Bilateral leg edema 07/30/2021   Left ear impacted cerumen 07/30/2021   Vertigo 07/30/2021   Acute pain of left shoulder 06/03/2021   Coronary artery calcification seen on CT scan 04/17/2021    Aortic atherosclerosis (HCC) 04/17/2021   Centrilobular emphysema (HCC) 04/17/2021   Grade I diastolic dysfunction 02/26/2021   CKD (chronic kidney disease) stage 3, GFR 30-59 ml/min (HCC) 02/20/2021   AKI (acute kidney injury) (HCC) 02/21/2020   Hyponatremia 02/20/2020   Hypocalcemia 02/20/2020   Low hemoglobin 02/20/2020   Herpes zoster with ophthalmic complication 02/03/2020   Acute intractable headache 01/31/2020   Vision changes 01/31/2020   Non-intractable vomiting 01/31/2020   Eye pain, right 01/31/2020   Plantar fasciitis, left 01/24/2020   Diarrhea 11/08/2019   OSA (obstructive sleep apnea) 09/20/2019   Hyperlipidemia 08/18/2019   Non-restorative sleep 08/17/2019   Snoring 08/17/2019   Class 2 obesity due to excess calories without serious comorbidity with body mass index (BMI) of 36.0 to 36.9 in adult 08/17/2019   Cervical spondylosis 05/10/2019   Right shoulder pain 04/12/2019   SOB (shortness of breath) on exertion 01/11/2019   Depressed mood 01/11/2019   Osteopenia 03/17/2018   Chronic venous stasis 12/22/2017   Muscle cramp 12/22/2017   Edema of left ankle 12/22/2017   Reactive airway disease 06/09/2017   Former smoker 06/09/2017   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   Contusion 11/13/2015   Essential hypertension, benign 08/28/2015   Hiatal hernia 12/09/2013   Anxiety state 11/02/2013   GERD (gastroesophageal reflux disease) 11/02/2013    PCP: [name], PA-C  REFERRING PROVIDER: Dr Rodney Langton  REFERRING DIAG: Nontraumatic complete tear of R RC   THERAPY DIAG:  Acute pain of right shoulder  Muscle weakness (generalized)  Other symptoms and signs involving the musculoskeletal system  Abnormal posture  Other abnormalities of gait and mobility  Dizziness and giddiness  Rationale for Evaluation and Treatment: Rehabilitation  ONSET DATE: 02/02/23  SUBJECTIVE:                                                                                                                                                                                       SUBJECTIVE STATEMENT:  Patient reports 8/10 pain in upper arm/deltoid area today; states it has been really bothering her the last few days but she has continued to do things despite it aggravating her shoulder.   PERTINENT HISTORY: HTN; asthma; COPD; allergies; anxiety; headaches   PAIN:  Are you having pain? Yes: NPRS scale: 5/10 Pain location: R shoulder - upper trap and proximal arm Pain description: deep aching  Aggravating factors: use of R UE  Relieving factors: not using arm; rest; heat helps a little  PRECAUTIONS: Shoulder  WEIGHT BEARING RESTRICTIONS: No  FALLS:  Has patient fallen in last 6 months? 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  DIAGNOSTIC FINDINGS:  Xray 9/24 - R shoulder - Subjective osteopenia/osteoporosis. Otherwise negative radiographs of the right shoulder.  PATIENT SURVEYS:  FOTO 42; goal 20  COGNITION: Overall cognitive status: Within functional limits for tasks assessed     SENSATION: WFL  POSTURE: Patient presents with head forward posture with increased thoracic kyphosis; shoulders rounded and elevated; scapulae abducted and rotated along the thoracic spine; head of the humerus anterior in orientation.   UPPER EXTREMITY ROM:   Active ROM Right eval Left eval  Shoulder flexion 58* 132  Shoulder extension 57 47  Shoulder abduction 72* 124  Shoulder adduction    Shoulder internal rotation Thumb L3* Thumb T10  Shoulder  external rotation 47* 49          Cervical flexion 62   Cervical extension 20   Lateral cervical flexion R 35   Lateral cervical flexion L 32 tight   Cervical rotation R 61   Cervical rotation L  54 tight    (Blank rows = not tested)  UPPER EXTREMITY MMT:  painful *  MMT Right eval Left eval  Shoulder flexion 3- 4-  Shoulder extension 4 4+  Shoulder abduction 2+ 4  Shoulder adduction    Shoulder internal rotation 2+ 4+  Shoulder external rotation 3+ 4+  Middle trapezius    Lower trapezius    Elbow flexion    Elbow extension    Wrist flexion    Wrist extension    Wrist ulnar deviation    Wrist radial deviation    Wrist pronation    Wrist supination    Grip strength (lbs)    (Blank rows = not tested)   PALPATION:  Muscular tightness upper trap; pecs; anterior and posterior shoulder; anterior arm into the ant deltoid, biceps;    [facility] Adult PT Treatment:                                                DATE: 05/28/2023 Therapeutic Exercise: Standing green PB rolling front & scpation Supine:  Chest press 1#dowel AAROM R shoulder clasping wrist with L hand x12 Scap squeeze + chest lift 5x5" Standing: L arms + chest lift Shoulder horizontal abd YTB x10 Bkwd shoulder circles x10 Manual Therapy: STM pecs, RTC complex, scap medial border Passive shoulder flexion (patient unable to relax) Modalities: TENS R shoulder girdle + moist heat x 10 min    [facility] Adult PT Treatment:                                                DATE: 05/27/2023 Therapeutic Exercise: Standing  Scap squeezes with noodle x10 L arms with noodle x10 W modified lower range with noodle x 10  Chin tucks with noodle 5 sec x10 Shoulder abd isometric at wall 5 sec x10 Shoulder ER isometric at wall 5 sec x10 Bkwd shoulder rolls x 15  Row green TB 3 sec x 10 x 2  Supine  Scap squeeze/chest lift 3 sec x 10  AAROM R shoulder clasping wrist with L hand 5 sec x 3  Chest press (keeping shoulder back)  bamboo dowel x 10  Modalities:  Moist heat x 15 min R shoulder (patient will use TENS unit at home)    [facility] Adult PT Treatment:                                                DATE: 05/21/2023 Therapeutic Exercise: Standing with back to noodle: Scap squeezes x10 L arms x10 Chin tucks + L finger cue x10 Shoulder abd isometric x10 Bkwd shoulder rolls Shoulder ER isometric x10 Standing rows RTB x10 --> GTB x10 Shoulder IR stretch with cane x10 Supine, noodle horiz at upper back --> shoulder flexion + 1# dowel Supine passive pec stretch x Modalities: TENS R shoulder girdle + moist heat x 15 min   PATIENT EDUCATION: Education details: POC; HEP Person educated: Patient Education method: Programmer, multimedia, Demonstration, Actor cues, Verbal cues, and Handouts Education comprehension: verbalized understanding, returned demonstration, verbal cues required, tactile cues required, and needs further education  HOME EXERCISE PROGRAM: Access Code: [email or web address] URL: [email or web address] Date: 05/27/2023 Prepared by: Corlis Leak  Exercises - Seated Scapular Retraction  - 2 x daily - 7 x weekly - 1-2 sets - 10 reps - 10 sec  hold - Seated Cervical Retraction  - 2 x daily - 7 x weekly - 1-2 sets - 5-10 reps - 10 sec  hold - Shoulder External Rotation and Scapular Retraction  - 2 x daily - 7 x weekly - 1 sets - 10 reps - 3-5 sec   hold - Isometric Shoulder Abduction at Wall  - 2 x daily - 7 x weekly - 1 sets - 5-10 reps - 5 sec  hold - Seated Isometric Shoulder Internal Rotation with Towel  - 2 x daily - 7 x weekly - 1 sets - 10 reps - 3 sec  hold - Seated Cervical Sidebending AROM  - 2 x daily - 7 x weekly - 1 sets - 5 reps - 5-10 sec  hold - Supine Shoulder Flexion AAROM with Hands Clasped  - 1 x daily - 7 x weekly - 1 sets - 3 reps - 3-5 sec  hold - Supine Shoulder Press AAROM in Abduction with Dowel  - 1 x daily - 7 x weekly - 1 sets - 10 reps - 2 sec  hold - Isometric Shoulder  Abduction at Wall  - 2 x daily - 7 x weekly - 1 sets - 5-10 reps - 5 sec  hold - Isometric Shoulder External Rotation at Wall  - 2 x daily - 7 x weekly - 1 sets - 5 reps - 5 sec  hold - Standing Bilateral Low Shoulder Row with Anchored Resistance  - 1 x daily - 7 x weekly - 1-3 sets - 10 reps - 2-3 sec  hold  Patient Education - TENS Unit  ASSESSMENT:  CLINICAL IMPRESSION: Patient continues to require frequent cueing for upright posture and scapula mobility with arm movement. Patient has difficulty with proper scapula retraction mechanics without upper trap compensation and increased forward rounded shoulder. Tactile cues with hand placement on top of shoulder improved shoulder stability and scapula retraction mechanics during shoulder external rotation and horizontal abduction exercises. Significant muscle guarding exhibited with passive shoulder flexion.   EVAL: Patient is a 72 y.o. female who was seen today for physical therapy evaluation and treatment for nontraumatic complete tear of R RC. She presents with R shoulder pain and decreased function. She has poor posture and alignment; limited cervical and shoulder ROM, mobility, strength, function. Patient has pain with use of R UE. She will benefit from PT to address problems identified.   OBJECTIVE IMPAIRMENTS: decreased activity tolerance, decreased mobility, decreased ROM, decreased strength, increased fascial restrictions, impaired UE functional use, improper body mechanics, postural dysfunction, obesity, and pain.    GOALS: Goals reviewed with patient? Yes  SHORT TERM GOALS: Target date: 06/24/2023  Independent in initial HEP  Baseline: Goal status: INITIAL  2.  Increase AROM R shoulder flexion to 90 deg Baseline:  Goal status: INITIAL  LONG TERM GOALS: Target date: 08/05/2023  Decrease pain R shoulder 50-70% allowing patient to return to more normal functional use R UE  Baseline:  Goal status: INITIAL  2.  Increase AROM R  shoulder elevation to 100 deg flexion; 90 deg abduction  Baseline:  Goal status: INITIAL  3.  Increase strength R UE to 4/5 throughout  Baseline:  Goal status: INITIAL  4.  Improve posture and alignment with patient demonstrating engagement of posterior shoulder girdle  Baseline:  Goal status: INITIAL  5.  Independent  in HEP, including aquatic program as indicated  Baseline:  Goal status: INITIAL  6.  Improve functional limitation score to 58 Baseline: 42 Goal status: INITIAL  PLAN:  PT FREQUENCY: 2x/week  PT DURATION: 12 weeks  PLANNED INTERVENTIONS: 97110-Therapeutic exercises, 97530- Therapeutic activity, 97112- Neuromuscular re-education, 97535- Self Care, [id number]- Manual therapy, [id number]- Aquatic Therapy, 97014- Electrical stimulation (unattended), 97016- Vasopneumatic device, 97035- Ultrasound, [id number]- Ionotophoresis 4mg /ml Dexamethasone, Patient/Family education, Taping, Dry Needling, Joint mobilization, Spinal mobilization, Cryotherapy, and Moist heat  PLAN FOR NEXT SESSION: review and progress exercise; postural correction and strengthening; manual work, DN, modalities as indicated    Sanjuana Mae, PTA 05/28/2023, 10:59 AM

## 2023-06-03 ENCOUNTER — Ambulatory Visit: Payer: Medicare HMO

## 2023-06-03 DIAGNOSIS — R293 Abnormal posture: Secondary | ICD-10-CM

## 2023-06-03 DIAGNOSIS — M25511 Pain in right shoulder: Secondary | ICD-10-CM

## 2023-06-03 DIAGNOSIS — R29898 Other symptoms and signs involving the musculoskeletal system: Secondary | ICD-10-CM | POA: Diagnosis not present

## 2023-06-03 DIAGNOSIS — M6281 Muscle weakness (generalized): Secondary | ICD-10-CM | POA: Diagnosis not present

## 2023-06-03 DIAGNOSIS — R2689 Other abnormalities of gait and mobility: Secondary | ICD-10-CM | POA: Diagnosis not present

## 2023-06-03 DIAGNOSIS — R42 Dizziness and giddiness: Secondary | ICD-10-CM | POA: Diagnosis not present

## 2023-06-03 NOTE — Therapy (Signed)
OUTPATIENT PHYSICAL THERAPY SHOULDER TREATMENT   Patient Name: Dollene Laperriere MRN: [id number] DOB:Oct 24, 1950, 72 y.o., female Today's Date: 06/03/2023  END OF SESSION:  PT End of Session - 06/03/23 1007     Visit Number 6    Number of Visits 24    Date for PT Re-Evaluation 08/05/23    Authorization Type Francine Graven auth required $25 copay    Authorization Time Period 12 VISITS APPROVED FOR PT 05/13/2023-08/05/2023    Authorization - Visit Number 6    Authorization - Number of Visits 12    Progress Note Due on Visit 10    PT Start Time 1010    PT Stop Time 1107    PT Time Calculation (min) 57 min    Activity Tolerance Patient tolerated treatment well    Behavior During Therapy WFL for tasks assessed/performed            Past Medical History:  Diagnosis Date   Asthma    Essential hypertension, benign 08/28/2015   GERD (gastroesophageal reflux disease)    Past Surgical History:  Procedure Laterality Date   ABDOMINAL HYSTERECTOMY     Patient Active Problem List   Diagnosis Date Noted   Rotator cuff tear, right 04/22/2023   Excessive daytime sleepiness 03/17/2023   Fatigue 03/17/2023   GAD (generalized anxiety disorder) 03/10/2023   Anxiety attack 03/10/2023   Atypical chest pain 02/10/2023   Chronic obstructive pulmonary disease (HCC) 02/10/2023   Mild intermittent asthma with exacerbation 02/04/2023   Pain of great toe, left 12/12/2022   Gout 09/05/2022   Great toe pain, right 09/03/2022   Pruritus 08/05/2022   Chronic allergic rhinitis 07/08/2022   Migraine without aura and without status migrainosus, not intractable 03/28/2022   Seasonal allergic rhinitis due to pollen 12/24/2021   Seasonal allergies 12/24/2021   Decreased GFR 12/24/2021   Injury of right leg 10/14/2021   Bilateral leg edema 07/30/2021   Left ear impacted cerumen 07/30/2021   Vertigo 07/30/2021   Acute pain of left shoulder 06/03/2021   Coronary artery calcification seen on CT scan 04/17/2021    Aortic atherosclerosis (HCC) 04/17/2021   Centrilobular emphysema (HCC) 04/17/2021   Grade I diastolic dysfunction 02/26/2021   CKD (chronic kidney disease) stage 3, GFR 30-59 ml/min (HCC) 02/20/2021   AKI (acute kidney injury) (HCC) 02/21/2020   Hyponatremia 02/20/2020   Hypocalcemia 02/20/2020   Low hemoglobin 02/20/2020   Herpes zoster with ophthalmic complication 02/03/2020   Acute intractable headache 01/31/2020   Vision changes 01/31/2020   Non-intractable vomiting 01/31/2020   Eye pain, right 01/31/2020   Plantar fasciitis, left 01/24/2020   Diarrhea 11/08/2019   OSA (obstructive sleep apnea) 09/20/2019   Hyperlipidemia 08/18/2019   Non-restorative sleep 08/17/2019   Snoring 08/17/2019   Class 2 obesity due to excess calories without serious comorbidity with body mass index (BMI) of 36.0 to 36.9 in adult 08/17/2019   Cervical spondylosis 05/10/2019   Right shoulder pain 04/12/2019   SOB (shortness of breath) on exertion 01/11/2019   Depressed mood 01/11/2019   Osteopenia 03/17/2018   Chronic venous stasis 12/22/2017   Muscle cramp 12/22/2017   Edema of left ankle 12/22/2017   Reactive airway disease 06/09/2017   Former smoker 06/09/2017   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   Contusion 11/13/2015   Essential hypertension, benign 08/28/2015   Hiatal hernia 12/09/2013   Anxiety state 11/02/2013   GERD (gastroesophageal reflux disease) 11/02/2013    PCP: [name], PA-C  REFERRING PROVIDER: Dr Rodney Langton  REFERRING DIAG: Nontraumatic complete tear of R RC   THERAPY DIAG:  Acute pain of right shoulder  Other symptoms and signs involving the musculoskeletal system  Abnormal posture  Rationale for Evaluation and Treatment: Rehabilitation  ONSET DATE: 02/02/23  SUBJECTIVE:                                                                                                                                                                                       SUBJECTIVE STATEMENT:  Patient reports she is waiting to hear from [facility] to schedule MRI and is planning to call to follow-up with them. Patient reports 6/10 pain in upper arm when "stretching the arm" but no pain pain at rest.   PERTINENT HISTORY: HTN; asthma; COPD; allergies; anxiety; headaches   PAIN:  Are you having pain? Yes: NPRS scale: 5/10 Pain location: R shoulder - upper trap and proximal arm Pain description: deep aching  Aggravating factors: use of R UE  Relieving factors: not using arm; rest; heat helps a little  PRECAUTIONS: Shoulder  WEIGHT BEARING RESTRICTIONS: No  FALLS:  Has patient fallen in last 6 months? 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  DIAGNOSTIC FINDINGS:  Xray 9/24 - R shoulder - Subjective osteopenia/osteoporosis. Otherwise negative radiographs of the right shoulder.  PATIENT SURVEYS:  FOTO 42; goal 4  COGNITION: Overall cognitive status: Within functional limits for tasks assessed     SENSATION: WFL  POSTURE: Patient presents with head forward posture with increased thoracic kyphosis; shoulders rounded and elevated; scapulae abducted and rotated along the thoracic spine; head of the humerus anterior in orientation.   UPPER EXTREMITY ROM:   Active ROM Right eval Left eval  Shoulder flexion 58* 132  Shoulder extension 57 47  Shoulder abduction 72* 124  Shoulder adduction    Shoulder internal rotation Thumb L3* Thumb T10  Shoulder external rotation 47* 49          Cervical flexion 62   Cervical extension 20    Lateral cervical flexion R 35   Lateral cervical flexion L 32 tight   Cervical rotation R 61   Cervical rotation L  54 tight    (Blank rows = not tested)  UPPER EXTREMITY MMT:  painful *  MMT Right eval Left eval  Shoulder flexion 3- 4-  Shoulder extension 4 4+  Shoulder abduction 2+ 4  Shoulder adduction    Shoulder internal rotation 2+ 4+  Shoulder external rotation 3+ 4+  Middle trapezius    Lower trapezius    Elbow flexion    Elbow extension    Wrist flexion    Wrist extension    Wrist ulnar deviation    Wrist radial deviation    Wrist pronation    Wrist supination    Grip strength (lbs)    (Blank rows = not tested)   PALPATION:  Muscular tightness upper trap; pecs; anterior and posterior shoulder; anterior arm into the ant deltoid, biceps;     [facility] Adult PT Treatment:                                                DATE: 06/03/2023 Therapeutic Exercise: [place] Si PB roll out --> R arm flexion/scaption, half circles CW/CCW Supine: Chest press 1#dowel x10 Shoulder flexion AAROM with dowel x10 Scap squeeze + chest lift 10x5"  Shoulder flexion & scaption stretch with hand on counter 10x5" each (R) arm wall slides --> flexion, scaption x10 each Standing + noodle: L arms + chest lift x15 Shoulder horiz abd + YTB 2x10 Shoulder shrug bkwd circles x15  S/L shoulder ER x10 Standing rows GTB x12 Modalities: TENS R shoulder girdle + moist heat x 10 min    [facility] Adult PT Treatment:                                                DATE: 05/28/2023 Therapeutic Exercise: Standing green PB rolling front & scpation Supine:  Chest press 1#dowel AAROM R shoulder clasping wrist with L hand x12 Scap squeeze + chest lift 5x5" Standing: L arms + chest lift Shoulder horizontal abd YTB x10 Bkwd shoulder circles x10 Manual Therapy: STM pecs, RTC complex, scap medial border Passive shoulder flexion (patient unable to relax) Modalities: TENS R shoulder girdle + moist heat x 10  min    [facility] Adult PT Treatment:                                                DATE: 05/27/2023 Therapeutic Exercise: Standing  Scap squeezes with noodle x10 L arms with noodle x10 W modified lower range with noodle x 10  Chin tucks with noodle 5 sec x10 Shoulder abd isometric at wall 5 sec x10 Shoulder ER isometric at wall 5 sec x10 Bkwd shoulder rolls x 15  Row green TB 3 sec x 10 x 2  Supine  Scap squeeze/chest lift 3 sec x 10  AAROM R shoulder clasping wrist with L hand 5 sec x 3  Chest press (keeping shoulder back) bamboo dowel x 10  Modalities:  Moist heat x 15 min R shoulder (patient will use TENS unit at home)    PATIENT EDUCATION: Education details: POC; HEP Person educated: Patient Education method: Programmer, multimedia, Demonstration, Tactile cues, Verbal cues, and Handouts Education comprehension: verbalized understanding, returned demonstration, verbal cues required, tactile cues required, and needs further education  HOME EXERCISE PROGRAM:  Access Code: [email or web address] URL: [email or web address] Date: 05/27/2023 Prepared by: Corlis Leak  Exercises - Seated Scapular Retraction  - 2 x daily - 7 x weekly - 1-2 sets - 10 reps - 10 sec  hold - Seated Cervical Retraction  - 2 x daily - 7 x weekly - 1-2 sets - 5-10 reps - 10 sec  hold - Shoulder External Rotation and Scapular Retraction  - 2 x daily - 7 x weekly - 1 sets - 10 reps - 3-5 sec   hold - Isometric Shoulder Abduction at Wall  - 2 x daily - 7 x weekly - 1 sets - 5-10 reps - 5 sec  hold - Seated Isometric Shoulder Internal Rotation with Towel  - 2 x daily - 7 x weekly - 1 sets - 10 reps - 3 sec  hold - Seated Cervical Sidebending AROM  - 2 x daily - 7 x weekly - 1 sets - 5 reps - 5-10 sec  hold - Supine Shoulder Flexion AAROM with Hands Clasped  - 1 x daily - 7 x weekly - 1 sets - 3 reps - 3-5 sec  hold - Supine Shoulder Press AAROM in Abduction with Dowel  - 1 x daily - 7 x weekly - 1 sets - 10 reps - 2 sec   hold - Isometric Shoulder Abduction at Wall  - 2 x daily - 7 x weekly - 1 sets - 5-10 reps - 5 sec  hold - Isometric Shoulder External Rotation at Wall  - 2 x daily - 7 x weekly - 1 sets - 5 reps - 5 sec  hold - Standing Bilateral Low Shoulder Row with Anchored Resistance  - 1 x daily - 7 x weekly - 1-3 sets - 10 reps - 2-3 sec  hold  Patient Education - TENS Unit  ASSESSMENT:  CLINICAL IMPRESSION:  Patient demonstrated improved tolerance with shoulder flexion AROM, however continues to have discomfort with movements in scaption plane. Patient continues to require frequent tactile cueing for scapular retraction activation and postural alignment.   EVAL: Patient is a 72 y.o. female who was seen today for physical therapy evaluation and treatment for nontraumatic complete tear of R RC. She presents with R shoulder pain and decreased function. She has poor posture and alignment; limited cervical and shoulder ROM, mobility, strength, function. Patient has pain with use of R UE. She will benefit from PT to address problems identified.   OBJECTIVE IMPAIRMENTS: decreased activity tolerance, decreased mobility, decreased ROM, decreased strength, increased fascial restrictions, impaired UE functional use, improper body mechanics, postural dysfunction, obesity, and pain.    GOALS: Goals reviewed with patient? Yes  SHORT TERM GOALS: Target date: 06/24/2023  Independent in initial HEP  Baseline: Goal status: INITIAL  2.  Increase AROM R shoulder flexion to 90 deg Baseline:  Goal status: INITIAL  LONG TERM GOALS: Target date: 08/05/2023  Decrease pain R shoulder 50-70% allowing patient to return to more normal functional use R UE  Baseline:  Goal status: INITIAL  2.  Increase AROM R shoulder elevation to 100 deg flexion; 90 deg abduction  Baseline:  Goal status: INITIAL  3.  Increase strength R UE to 4/5 throughout  Baseline:  Goal status: INITIAL  4.  Improve posture and alignment with  patient demonstrating engagement of posterior shoulder girdle  Baseline:  Goal status: INITIAL  5.  Independent in HEP, including aquatic program as indicated  Baseline:  Goal status: INITIAL  6.  Improve functional limitation  score to 58 Baseline: 42 Goal status: INITIAL  PLAN:  PT FREQUENCY: 2x/week  PT DURATION: 12 weeks  PLANNED INTERVENTIONS: 97110-Therapeutic exercises, 97530- Therapeutic activity, 97112- Neuromuscular re-education, 97535- Self Care, 16109- Manual therapy, [id number]- Aquatic Therapy, 97014- Electrical stimulation (unattended), 97016- Vasopneumatic device, 97035- Ultrasound, 60454- Ionotophoresis 4mg /ml Dexamethasone, Patient/Family education, Taping, Dry Needling, Joint mobilization, Spinal mobilization, Cryotherapy, and Moist heat  PLAN FOR NEXT SESSION: review and progress exercise; postural correction and strengthening; manual work, DN, modalities as indicated    Sanjuana Mae, PTA 06/03/2023, 11:00 AM

## 2023-06-04 ENCOUNTER — Ambulatory Visit: Payer: Medicare HMO

## 2023-06-04 DIAGNOSIS — M6281 Muscle weakness (generalized): Secondary | ICD-10-CM

## 2023-06-04 DIAGNOSIS — M25511 Pain in right shoulder: Secondary | ICD-10-CM | POA: Diagnosis not present

## 2023-06-04 DIAGNOSIS — R2689 Other abnormalities of gait and mobility: Secondary | ICD-10-CM | POA: Diagnosis not present

## 2023-06-04 DIAGNOSIS — R29898 Other symptoms and signs involving the musculoskeletal system: Secondary | ICD-10-CM

## 2023-06-04 DIAGNOSIS — R293 Abnormal posture: Secondary | ICD-10-CM

## 2023-06-04 DIAGNOSIS — R42 Dizziness and giddiness: Secondary | ICD-10-CM | POA: Diagnosis not present

## 2023-06-04 NOTE — Therapy (Signed)
OUTPATIENT PHYSICAL THERAPY SHOULDER TREATMENT   Patient Name: Alynn Hipke MRN: [id number] DOB:November 16, 1950, 72 y.o., female Today's Date: 06/04/2023  END OF SESSION:  PT End of Session - 06/04/23 1017     Visit Number 7    Number of Visits 24    Date for PT Re-Evaluation 08/05/23    Authorization Type Francine Graven auth required $25 copay    Authorization Time Period 12 VISITS APPROVED FOR PT 05/13/2023-08/05/2023    Authorization - Visit Number 7    Authorization - Number of Visits 12    Progress Note Due on Visit 10    PT Start Time 1016    PT Stop Time 1113    PT Time Calculation (min) 57 min    Activity Tolerance Patient tolerated treatment well    Behavior During Therapy WFL for tasks assessed/performed            Past Medical History:  Diagnosis Date   Asthma    Essential hypertension, benign 08/28/2015   GERD (gastroesophageal reflux disease)    Past Surgical History:  Procedure Laterality Date   ABDOMINAL HYSTERECTOMY     Patient Active Problem List   Diagnosis Date Noted   Rotator cuff tear, right 04/22/2023   Excessive daytime sleepiness 03/17/2023   Fatigue 03/17/2023   GAD (generalized anxiety disorder) 03/10/2023   Anxiety attack 03/10/2023   Atypical chest pain 02/10/2023   Chronic obstructive pulmonary disease (HCC) 02/10/2023   Mild intermittent asthma with exacerbation 02/04/2023   Pain of great toe, left 12/12/2022   Gout 09/05/2022   Great toe pain, right 09/03/2022   Pruritus 08/05/2022   Chronic allergic rhinitis 07/08/2022   Migraine without aura and without status migrainosus, not intractable 03/28/2022   Seasonal allergic rhinitis due to pollen 12/24/2021   Seasonal allergies 12/24/2021   Decreased GFR 12/24/2021   Injury of right leg 10/14/2021   Bilateral leg edema 07/30/2021   Left ear impacted cerumen 07/30/2021   Vertigo 07/30/2021   Acute pain of left shoulder 06/03/2021   Coronary artery calcification seen on CT scan 04/17/2021    Aortic atherosclerosis (HCC) 04/17/2021   Centrilobular emphysema (HCC) 04/17/2021   Grade I diastolic dysfunction 02/26/2021   CKD (chronic kidney disease) stage 3, GFR 30-59 ml/min (HCC) 02/20/2021   AKI (acute kidney injury) (HCC) 02/21/2020   Hyponatremia 02/20/2020   Hypocalcemia 02/20/2020   Low hemoglobin 02/20/2020   Herpes zoster with ophthalmic complication 02/03/2020   Acute intractable headache 01/31/2020   Vision changes 01/31/2020   Non-intractable vomiting 01/31/2020   Eye pain, right 01/31/2020   Plantar fasciitis, left 01/24/2020   Diarrhea 11/08/2019   OSA (obstructive sleep apnea) 09/20/2019   Hyperlipidemia 08/18/2019   Non-restorative sleep 08/17/2019   Snoring 08/17/2019   Class 2 obesity due to excess calories without serious comorbidity with body mass index (BMI) of 36.0 to 36.9 in adult 08/17/2019   Cervical spondylosis 05/10/2019   Right shoulder pain 04/12/2019   SOB (shortness of breath) on exertion 01/11/2019   Depressed mood 01/11/2019   Osteopenia 03/17/2018   Chronic venous stasis 12/22/2017   Muscle cramp 12/22/2017   Edema of left ankle 12/22/2017   Reactive airway disease 06/09/2017   Former smoker 06/09/2017   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   Contusion 11/13/2015   Essential hypertension, benign 08/28/2015   Hiatal hernia 12/09/2013   Anxiety state 11/02/2013   GERD (gastroesophageal reflux disease) 11/02/2013    PCP: [name], PA-C  REFERRING PROVIDER: Dr Rodney Langton  REFERRING DIAG: Nontraumatic complete tear of R RC   THERAPY DIAG:  Acute pain of right shoulder  Abnormal posture  Other symptoms and signs involving the musculoskeletal system  Muscle weakness (generalized)  Rationale for Evaluation and Treatment: Rehabilitation  ONSET DATE: 02/02/23  SUBJECTIVE:                                                                                                                                                                                       SUBJECTIVE STATEMENT:  Patient states she has pain when reaching to the side; has MRI scheduled on 06/15/23.   PERTINENT HISTORY: HTN; asthma; COPD; allergies; anxiety; headaches   PAIN:  Are you having pain? Yes: NPRS scale: 5/10 Pain location: R shoulder - upper trap and proximal arm Pain description: deep aching  Aggravating factors: use of R UE  Relieving factors: not using arm; rest; heat helps a little  PRECAUTIONS: Shoulder  WEIGHT BEARING RESTRICTIONS: No  FALLS:  Has patient fallen in last 6 months? 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  DIAGNOSTIC FINDINGS:  Xray 9/24 - R shoulder - Subjective osteopenia/osteoporosis. Otherwise negative radiographs of the right shoulder.  PATIENT SURVEYS:  FOTO 42; goal 40  COGNITION: Overall cognitive status: Within functional limits for tasks assessed     SENSATION: WFL  POSTURE: Patient presents with head forward posture with increased thoracic kyphosis; shoulders rounded and elevated; scapulae abducted and rotated along the thoracic spine; head of the humerus anterior in orientation.   UPPER EXTREMITY ROM:   Active ROM Right eval Left eval Right 06/04/23  Shoulder flexion 58* 132 128* mod pain lowering arm  Shoulder extension 57 47   Shoulder abduction 72* 124 79* mod pain lowering arm  Shoulder adduction     Shoulder internal rotation Thumb L3* Thumb T10   Shoulder external rotation 47* 49             Cervical flexion 62    Cervical extension 20    Lateral  cervical flexion R 35    Lateral cervical flexion L 32 tight    Cervical rotation R 61    Cervical rotation L  54 tight     (Blank rows = not tested)  UPPER EXTREMITY MMT:  painful *  MMT  Right eval Left eval  Shoulder flexion 3- 4-  Shoulder extension 4 4+  Shoulder abduction 2+ 4  Shoulder adduction    Shoulder internal rotation 2+ 4+  Shoulder external rotation 3+ 4+  Middle trapezius    Lower trapezius    Elbow flexion    Elbow extension    Wrist flexion    Wrist extension    Wrist ulnar deviation    Wrist radial deviation    Wrist pronation    Wrist supination    Grip strength (lbs)    (Blank rows = not tested)   PALPATION:  Muscular tightness upper trap; pecs; anterior and posterior shoulder; anterior arm into the ant deltoid, biceps;     [facility] Adult PT Treatment:                                                DATE: 06/04/2023 Therapeutic Exercise: Shoulder flexion stretch at counter x10 Standing with noodle: Scap squeeze + chest lift x10 L arms + chest lift x10 Shoulder horiz add YTB x10 --> low arm position x10 YTB Shoulder flexion wall slides x10 Shoulder flexion & abd measurements (see above) Supine: Chest press 1.5# dowel 2x10 Shoulder horiz add YTB x10 Seated (R) UT stretch with straight arm stretched on chair support 5x10"  Standing rows GTB 2x10 Shoulder ER isometric + RTB x10 (R) Shoulder extension isometric 10x5" (R) Modalities: TENS R shoulder girdle + moist heat x 10 min    [facility] Adult PT Treatment:                                                DATE: 06/03/2023 Therapeutic Exercise: [place] Si PB roll out --> R arm flexion/scaption, half circles CW/CCW Supine: Chest press 1#dowel x10 Shoulder flexion AAROM with dowel x10 Scap squeeze + chest lift 10x5"  Shoulder flexion & scaption stretch with hand on counter 10x5" each (R) arm wall slides --> flexion, scaption x10 each Standing + noodle: L arms + chest lift x15 Shoulder horiz abd + YTB  2x10 Shoulder shrug bkwd circles x15  S/L shoulder ER x10 Standing rows GTB x12 Modalities: TENS R shoulder girdle + moist heat x 10 min    [facility] Adult PT Treatment:                                                DATE: 05/28/2023 Therapeutic Exercise: Standing green PB rolling front & scpation Supine:  Chest press 1#dowel AAROM R shoulder clasping wrist with L hand x12 Scap squeeze + chest lift 5x5" Standing: L arms + chest lift Shoulder horizontal abd YTB x10 Bkwd shoulder circles x10 Manual Therapy: STM pecs, RTC complex, scap medial border Passive shoulder flexion (patient unable to relax) Modalities: TENS R shoulder girdle + moist heat x 10 min   PATIENT EDUCATION: Education details: POC; HEP Person educated: Patient Education method: Programmer, multimedia, Demonstration, Tactile cues, Verbal cues, and Handouts Education comprehension: verbalized understanding, returned demonstration, verbal cues required, tactile cues required, and needs further education  HOME EXERCISE PROGRAM: Access Code: [email or web address] URL: [email or web address] Date: 05/27/2023 Prepared by: Renie Ora  Holt  Exercises - Seated Scapular Retraction  - 2 x daily - 7 x weekly - 1-2 sets - 10 reps - 10 sec  hold - Seated Cervical Retraction  - 2 x daily - 7 x weekly - 1-2 sets - 5-10 reps - 10 sec  hold - Shoulder External Rotation and Scapular Retraction  - 2 x daily - 7 x weekly - 1 sets - 10 reps - 3-5 sec   hold - Isometric Shoulder Abduction at Wall  - 2 x daily - 7 x weekly - 1 sets - 5-10 reps - 5 sec  hold - Seated Isometric Shoulder Internal Rotation with Towel  - 2 x daily - 7 x weekly - 1 sets - 10 reps - 3 sec  hold - Seated Cervical Sidebending AROM  - 2 x daily - 7 x weekly - 1 sets - 5 reps - 5-10 sec  hold - Supine Shoulder Flexion AAROM with Hands Clasped  - 1 x daily - 7 x weekly - 1 sets - 3 reps - 3-5 sec  hold - Supine Shoulder Press AAROM in Abduction with Dowel  - 1 x daily - 7 x  weekly - 1 sets - 10 reps - 2 sec  hold - Isometric Shoulder Abduction at Wall  - 2 x daily - 7 x weekly - 1 sets - 5-10 reps - 5 sec  hold - Isometric Shoulder External Rotation at Wall  - 2 x daily - 7 x weekly - 1 sets - 5 reps - 5 sec  hold - Standing Bilateral Low Shoulder Row with Anchored Resistance  - 1 x daily - 7 x weekly - 1-3 sets - 10 reps - 2-3 sec  hold  Patient Education - TENS Unit  ASSESSMENT:  CLINICAL IMPRESSION:  Shoulder isometrics added for shoulder external rotation and extension strengthening; patient tolerated well with no exacerbation of pain. Significant improvement with shoulder flexion AROM to 128 degrees, however moderate pain reported when lowering arm. Slight improvement in shoulder abduction AROM with moderate pain when lowering arm as well.    EVAL: Patient is a 72 y.o. female who was seen today for physical therapy evaluation and treatment for nontraumatic complete tear of R RC. She presents with R shoulder pain and decreased function. She has poor posture and alignment; limited cervical and shoulder ROM, mobility, strength, function. Patient has pain with use of R UE. She will benefit from PT to address problems identified.   OBJECTIVE IMPAIRMENTS: decreased activity tolerance, decreased mobility, decreased ROM, decreased strength, increased fascial restrictions, impaired UE functional use, improper body mechanics, postural dysfunction, obesity, and pain.    GOALS: Goals reviewed with patient? Yes  SHORT TERM GOALS: Target date: 06/24/2023  Independent in initial HEP  Baseline: Goal status: MET  2.  Increase AROM R shoulder flexion to 90 deg Baseline:  Goal status: MET  LONG TERM GOALS: Target date: 08/05/2023  Decrease pain R shoulder 50-70% allowing patient to return to more normal functional use R UE  Baseline:  Goal status: INITIAL  2.  Increase AROM R shoulder elevation to 100 deg flexion; 90 deg abduction  Baseline:  Goal status:  INITIAL  3.  Increase strength R UE to 4/5 throughout  Baseline:  Goal status: INITIAL  4.  Improve posture and alignment with patient demonstrating engagement of posterior shoulder girdle  Baseline:  Goal status: INITIAL  5.  Independent in HEP, including aquatic program as indicated  Baseline:  Goal status:  INITIAL  6.  Improve functional limitation score to 58 Baseline: 42 Goal status: INITIAL  PLAN:  PT FREQUENCY: 2x/week  PT DURATION: 12 weeks  PLANNED INTERVENTIONS: 97110-Therapeutic exercises, 97530- Therapeutic activity, 97112- Neuromuscular re-education, 97535- Self Care, 14782- Manual therapy, [id number]- Aquatic Therapy, 97014- Electrical stimulation (unattended), 97016- Vasopneumatic device, 97035- Ultrasound, [id number]- Ionotophoresis 4mg /ml Dexamethasone, Patient/Family education, Taping, Dry Needling, Joint mobilization, Spinal mobilization, Cryotherapy, and Moist heat  PLAN FOR NEXT SESSION: review and progress exercise; postural correction and strengthening; manual work, DN, modalities as indicated    Sanjuana Mae, PTA 06/04/2023, 11:04 AM

## 2023-06-08 NOTE — Telephone Encounter (Signed)
Ok for 2 samples

## 2023-06-10 ENCOUNTER — Encounter: Payer: Self-pay | Admitting: Rehabilitative and Restorative Service Providers"

## 2023-06-10 ENCOUNTER — Institutional Professional Consult (permissible substitution): Payer: Medicare HMO | Admitting: Nurse Practitioner

## 2023-06-10 ENCOUNTER — Ambulatory Visit: Payer: Medicare HMO | Admitting: Rehabilitative and Restorative Service Providers"

## 2023-06-10 DIAGNOSIS — R42 Dizziness and giddiness: Secondary | ICD-10-CM | POA: Diagnosis not present

## 2023-06-10 DIAGNOSIS — R293 Abnormal posture: Secondary | ICD-10-CM

## 2023-06-10 DIAGNOSIS — M6281 Muscle weakness (generalized): Secondary | ICD-10-CM

## 2023-06-10 DIAGNOSIS — R2689 Other abnormalities of gait and mobility: Secondary | ICD-10-CM

## 2023-06-10 DIAGNOSIS — R29898 Other symptoms and signs involving the musculoskeletal system: Secondary | ICD-10-CM | POA: Diagnosis not present

## 2023-06-10 DIAGNOSIS — M25511 Pain in right shoulder: Secondary | ICD-10-CM | POA: Diagnosis not present

## 2023-06-10 NOTE — Therapy (Addendum)
 OUTPATIENT PHYSICAL THERAPY SHOULDER TREATMENT  PHYSICAL THERAPY DISCHARGE SUMMARY  Visits from Start of Care: 8  Current functional level related to goals / functional outcomes: See progress note for discharge status    Remaining deficits: Unknown    Education / Equipment: HEP    Patient agrees to discharge. Patient goals were not met. Patient is being discharged due to did not respond to therapy.   Loomis Anacker P. Leonor Liv PT, MPH 10/01/23 2:43 PM     Patient Name: Ana King MRN: [id number] DOB:[birth year], 72 y.o., female Today's Date: 06/10/2023  END OF SESSION:  PT End of Session - 06/10/23 1022     Visit Number 8    Number of Visits 24    Date for PT Re-Evaluation 08/05/23    Authorization Type Francine Graven auth required $25 copay    Authorization Time Period 12 VISITS APPROVED FOR PT 05/13/2023-08/05/2023    Authorization - Visit Number 8    Authorization - Number of Visits 12    Progress Note Due on Visit 10    PT Start Time 1020    PT Stop Time 1100    PT Time Calculation (min) 40 min    Activity Tolerance Patient tolerated treatment well            Past Medical History:  Diagnosis Date   Asthma    Essential hypertension, benign 08/28/2015   GERD (gastroesophageal reflux disease)    Past Surgical History:  Procedure Laterality Date   ABDOMINAL HYSTERECTOMY     Patient Active Problem List   Diagnosis Date Noted   Rotator cuff tear, right 04/22/2023   Excessive daytime sleepiness 03/17/2023   Fatigue 03/17/2023   GAD (generalized anxiety disorder) 03/10/2023   Anxiety attack 03/10/2023   Atypical chest pain 02/10/2023   Chronic obstructive pulmonary disease (HCC) 02/10/2023   Mild intermittent asthma with exacerbation 02/04/2023   Pain of great toe, left 12/12/2022   Gout 09/05/2022   Great toe pain, right 09/03/2022   Pruritus 08/05/2022   Chronic allergic rhinitis 07/08/2022   Migraine without aura and without status migrainosus, not intractable  03/28/2022   Seasonal allergic rhinitis due to pollen 12/24/2021   Seasonal allergies 12/24/2021   Decreased GFR 12/24/2021   Injury of right leg 10/14/2021   Bilateral leg edema 07/30/2021   Left ear impacted cerumen 07/30/2021   Vertigo 07/30/2021   Acute pain of left shoulder 06/03/2021   Coronary artery calcification seen on CT scan 04/17/2021   Aortic atherosclerosis (HCC) 04/17/2021   Centrilobular emphysema (HCC) 04/17/2021   Grade I diastolic dysfunction 02/26/2021   CKD (chronic kidney disease) stage 3, GFR 30-59 ml/min (HCC) 02/20/2021   AKI (acute kidney injury) (HCC) 02/21/2020   Hyponatremia 02/20/2020   Hypocalcemia 02/20/2020   Low hemoglobin 02/20/2020   Herpes zoster with ophthalmic complication 02/03/2020   Acute intractable headache 01/31/2020   Vision changes 01/31/2020   Non-intractable vomiting 01/31/2020   Eye pain, right 01/31/2020   Plantar fasciitis, left 01/24/2020   Diarrhea 11/08/2019   OSA (obstructive sleep apnea) 09/20/2019   Hyperlipidemia 08/18/2019   Non-restorative sleep 08/17/2019   Snoring 08/17/2019   Class 2 obesity due to excess calories without serious comorbidity with body mass index (BMI) of 36.0 to 36.9 in adult 08/17/2019   Cervical spondylosis 05/10/2019   Right shoulder pain 04/12/2019   SOB (shortness of breath) on exertion 01/11/2019   Depressed mood 01/11/2019   Osteopenia 03/17/2018   Chronic venous stasis 12/22/2017   Muscle cramp  12/22/2017   Edema of left ankle 12/22/2017   Reactive airway disease 06/09/2017   Former smoker 06/09/2017   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   Contusion 11/13/2015   Essential hypertension, benign 08/28/2015   Hiatal hernia 12/09/2013   Anxiety state 11/02/2013   GERD (gastroesophageal reflux disease) 11/02/2013    PCP: [name], PA-C  REFERRING PROVIDER: Dr Rodney Langton   REFERRING DIAG: Nontraumatic complete tear of R RC   THERAPY DIAG:  Acute pain of right  shoulder  Abnormal posture  Other symptoms and signs involving the musculoskeletal system  Muscle weakness (generalized)  Other abnormalities of gait and mobility  Rationale for Evaluation and Treatment: Rehabilitation  ONSET DATE: 02/02/23  SUBJECTIVE:                                                                                                                                                                                      SUBJECTIVE STATEMENT:  Patient states she has continued soreness and pain in the arm. She has pain with reaching to the side, overhead, dressing, using arm for functional activities. She MRI scheduled on 06/15/23 and sees Dr T 07/02/23. She is OK with holding therapy until after RTD.    PERTINENT HISTORY: HTN; asthma; COPD; allergies; anxiety; headaches   PAIN:  Are you having pain? Yes: NPRS scale: 5-6/10 Pain location: R shoulder - upper trap and proximal arm Pain description: deep aching  Aggravating factors: use of R UE  Relieving factors: not using arm; rest; heat helps a little  PRECAUTIONS: Shoulder  WEIGHT BEARING RESTRICTIONS: No  FALLS:  Has patient fallen in last 6 months? 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  DIAGNOSTIC FINDINGS:  Xray 9/24 - R shoulder - Subjective osteopenia/osteoporosis. Otherwise negative radiographs of the right shoulder.  PATIENT SURVEYS:  FOTO 42; goal 58 06/10/23: 46  COGNITION: Overall cognitive status: Within functional limits for tasks  assessed     SENSATION: WFL  POSTURE: Patient presents with head forward posture with increased thoracic kyphosis; shoulders rounded and elevated; scapulae abducted and rotated along the thoracic spine; head of the humerus anterior in orientation.   UPPER EXTREMITY ROM:   Active ROM Right eval Left eval Right 06/04/23  Shoulder flexion 58* 132 128* mod pain lowering arm  Shoulder  extension 57 47   Shoulder abduction 72* 124 79* mod pain lowering arm  Shoulder adduction     Shoulder internal rotation Thumb L3* Thumb T10   Shoulder external rotation 47* 49             Cervical flexion 62    Cervical extension 20    Lateral cervical flexion R 35    Lateral cervical flexion L 32 tight    Cervical rotation R 61    Cervical rotation L  54 tight     (Blank rows = not tested)  UPPER EXTREMITY MMT:  painful *  MMT Right eval Left eval Right  06/10/23  Shoulder flexion 3- 4- 3+  Shoulder extension 4 4+ 4  Shoulder abduction 2+ 4 3-  Shoulder adduction     Shoulder internal rotation 2+ 4+   Shoulder external rotation 3+ 4+   Middle trapezius     Lower trapezius     Elbow flexion     Elbow extension     Wrist flexion     Wrist extension     Wrist ulnar deviation     Wrist radial deviation     Wrist pronation     Wrist supination     Grip strength (lbs)     (Blank rows = not tested)   PALPATION:  Muscular tightness upper trap; pecs; anterior and posterior shoulder; anterior arm into the ant deltoid, biceps;    [facility] Adult PT Treatment:                                                DATE: 06/10/2023 Therapeutic Exercise: Shoulder flexion stretch at counter x10 Standing with noodle: Scap squeeze + chest lift x10 L arms + chest lift x10 Shoulder horiz add YTB x10 --> low arm position x10 YTB Standing rows isometric GTB 2x10 Shoulder ER isometric + RTB x10 (R) Shoulder extension isometric  YTB 10x5" (R) Shoulder flexion wall slides x10 Modalities: Moist heat x 10  min    [facility] Adult PT Treatment:                                                DATE: 06/04/2023 Therapeutic Exercise: Shoulder flexion stretch at counter x10 Standing with noodle: Scap squeeze + chest lift x10 L arms + chest lift x10 Shoulder horiz add YTB x10 --> low arm position x10 YTB Shoulder flexion wall slides x10 Shoulder flexion & abd measurements (see above) Supine: Chest press 1.5# dowel 2x10 Shoulder horiz add YTB x10 Seated (R) UT stretch with straight arm stretched on chair support 5x10"  Standing rows GTB 2x10 Shoulder ER isometric + RTB x10 (R) Shoulder extension isometric 10x5" (R) Modalities: TENS R shoulder girdle + moist heat x 10 min    PATIENT EDUCATION: Education details: POC; HEP Person educated: Patient Education method: Programmer, multimedia, Demonstration, Tactile cues, Verbal cues, and Handouts Education comprehension: verbalized understanding, returned demonstration, verbal cues required, tactile cues required, and needs further education  HOME EXERCISE PROGRAM: Access Code: [email or web address] URL: [email or web address] Date: 05/27/2023 Prepared by: Corlis Leak  Exercises - Seated Scapular Retraction  - 2 x daily - 7 x weekly - 1-2 sets - 10 reps - 10 sec  hold -  Seated Cervical Retraction  - 2 x daily - 7 x weekly - 1-2 sets - 5-10 reps - 10 sec  hold - Shoulder External Rotation and Scapular Retraction  - 2 x daily - 7 x weekly - 1 sets - 10 reps - 3-5 sec   hold - Isometric Shoulder Abduction at Wall  - 2 x daily - 7 x weekly - 1 sets - 5-10 reps - 5 sec  hold - Seated Isometric Shoulder Internal Rotation with Towel  - 2 x daily - 7 x weekly - 1 sets - 10 reps - 3 sec  hold - Seated Cervical Sidebending AROM  - 2 x daily - 7 x weekly - 1 sets - 5 reps - 5-10 sec  hold - Supine Shoulder Flexion AAROM with Hands Clasped  - 1 x daily - 7 x weekly - 1 sets - 3 reps - 3-5 sec  hold - Supine Shoulder Press AAROM in Abduction with Dowel  - 1 x daily - 7  x weekly - 1 sets - 10 reps - 2 sec  hold - Isometric Shoulder Abduction at Wall  - 2 x daily - 7 x weekly - 1 sets - 5-10 reps - 5 sec  hold - Isometric Shoulder External Rotation at Wall  - 2 x daily - 7 x weekly - 1 sets - 5 reps - 5 sec  hold - Standing Bilateral Low Shoulder Row with Anchored Resistance  - 1 x daily - 7 x weekly - 1-3 sets - 10 reps - 2-3 sec  hold  Patient Education - TENS Unit  ASSESSMENT:  CLINICAL IMPRESSION:  Patient has persistent pain in R shoulder with any functional use of R UE. She has difficulty tolerating progression of exercises in the clinic and for home program. Trial of isometrics; active assistive; light resistive exercises are tolerated with some pain or discomfort with progression irritating to shoulder pain. Good improvement with shoulder flexion AROM to 128 degrees but patient has moderate pain reported when lowering arm. We will hold PT pending MRI and RTD.    EVAL: Patient is a 72 y.o. female who was seen today for physical therapy evaluation and treatment for nontraumatic complete tear of R RC. She presents with R shoulder pain and decreased function. She has poor posture and alignment; limited cervical and shoulder ROM, mobility, strength, function. Patient has pain with use of R UE. She will benefit from PT to address problems identified.   OBJECTIVE IMPAIRMENTS: decreased activity tolerance, decreased mobility, decreased ROM, decreased strength, increased fascial restrictions, impaired UE functional use, improper body mechanics, postural dysfunction, obesity, and pain.    GOALS: Goals reviewed with patient? Yes  SHORT TERM GOALS: Target date: 06/24/2023  Independent in initial HEP  Baseline: Goal status: MET  2.  Increase AROM R shoulder flexion to 90 deg Baseline:  Goal status: MET  LONG TERM GOALS: Target date: 08/05/2023  Decrease pain R shoulder 50-70% allowing patient to return to more normal functional use R UE  Baseline:  Goal  status: on going   2.  Increase AROM R shoulder elevation to 100 deg flexion; 90 deg abduction  Baseline:  Goal status: partially accomplished   3.  Increase strength R UE to 4/5 throughout  Baseline:  Goal status: on going   4.  Improve posture and alignment with patient demonstrating engagement of posterior shoulder girdle  Baseline:  Goal status: on going   5.  Independent in HEP, including aquatic program  as indicated  Baseline:  Goal status: on going   6.  Improve functional limitation score to 58 Baseline: 42 06/10/23: 46 Goal status: on going   PLAN:  PT FREQUENCY: 2x/week  PT DURATION: 12 weeks  PLANNED INTERVENTIONS: 97110-Therapeutic exercises, 97530- Therapeutic activity, 97112- Neuromuscular re-education, 97535- Self Care, 24401- Manual therapy, [phone]- Aquatic Therapy, 97014- Electrical stimulation (unattended), 97016- Vasopneumatic device, [id number]- Ultrasound, [id number]- Ionotophoresis 4mg /ml Dexamethasone, Patient/Family education, Taping, Dry Needling, Joint mobilization, Spinal mobilization, Cryotherapy, and Moist heat  PLAN FOR NEXT SESSION: review and progress exercise; postural correction and strengthening; manual work, DN, modalities as indicated    [employer], PT 06/10/2023, 10:53 AM

## 2023-06-11 ENCOUNTER — Ambulatory Visit: Payer: Medicare HMO

## 2023-06-15 DIAGNOSIS — M19011 Primary osteoarthritis, right shoulder: Secondary | ICD-10-CM | POA: Diagnosis not present

## 2023-06-15 DIAGNOSIS — M75111 Incomplete rotator cuff tear or rupture of right shoulder, not specified as traumatic: Secondary | ICD-10-CM | POA: Diagnosis not present

## 2023-06-15 DIAGNOSIS — M7501 Adhesive capsulitis of right shoulder: Secondary | ICD-10-CM | POA: Diagnosis not present

## 2023-06-15 DIAGNOSIS — M75121 Complete rotator cuff tear or rupture of right shoulder, not specified as traumatic: Secondary | ICD-10-CM | POA: Diagnosis not present

## 2023-06-17 ENCOUNTER — Ambulatory Visit: Payer: Self-pay

## 2023-06-22 ENCOUNTER — Telehealth: Payer: Self-pay | Admitting: Acute Care

## 2023-06-22 ENCOUNTER — Ambulatory Visit: Payer: Medicare HMO | Admitting: Sports Medicine

## 2023-06-22 NOTE — Telephone Encounter (Signed)
Call report confirmed   IMPRESSION: 1. Lung-RADS 4A, suspicious. Follow up low-dose chest CT without contrast in 3 months (please use the following order, "CT CHEST LCS NODULE FOLLOW-UP W/O CM") is recommended. New posterior right upper lobe opacity which is favored to represent atelectasis, scar, or minimal infection. Correlate with infectious symptoms and consider antibiotic therapy prior to CT follow-up. 2. Aortic atherosclerosis (ICD10-I70.0), coronary artery atherosclerosis and emphysema (ICD10-J43.9).

## 2023-06-24 ENCOUNTER — Telehealth: Payer: Self-pay | Admitting: Acute Care

## 2023-06-24 ENCOUNTER — Ambulatory Visit: Payer: Medicare HMO | Admitting: Rehabilitative and Restorative Service Providers"

## 2023-06-24 NOTE — Telephone Encounter (Signed)
I have attempted to call the patient with the results of their  Low Dose CT Chest Lung cancer screening scan. There was no answer. I have left a HIPPA compliant VM requesting the patient call the office for the scan results. I included the office contact information in the message. We will await his return call. If no return call we will continue to call until patient is contacted.    Sherre Lain, Robynn Pane and [place], can you call her tomorrow, or take her return call if she calls back? The scan shows what looks like a right upper lobe soft tissue density is new and measures up to  18.6 mm . Per radiology, they feel this is  favored to represent atelectasis, scar, or minimally infection. Please ask her if she has been sick. If she has she should go to her PCP to be evaluated. Plan will be for a 3 month follow up due early February 2025.  Please fax results to PCP and place order for 3 month follow up scan. Thanks so much

## 2023-06-25 ENCOUNTER — Other Ambulatory Visit: Payer: Self-pay

## 2023-06-25 DIAGNOSIS — Z87891 Personal history of nicotine dependence: Secondary | ICD-10-CM

## 2023-06-25 DIAGNOSIS — Z122 Encounter for screening for malignant neoplasm of respiratory organs: Secondary | ICD-10-CM

## 2023-06-25 NOTE — Telephone Encounter (Signed)
Patient is returning phone call. Patient phone number is [phone].

## 2023-06-25 NOTE — Telephone Encounter (Signed)
CT order placed and results faxed to PCP.

## 2023-06-25 NOTE — Telephone Encounter (Signed)
LVM and request call back.

## 2023-06-25 NOTE — Telephone Encounter (Signed)
Returned call. Spoke with patient and reviewed results. CT scheduled for 08/28/2023 at 2:00pm. Customer denies any recent illness prior to to scan. In the last 7 days she has had a runny nose. PCP f/u next Monday 12/9.

## 2023-06-29 ENCOUNTER — Ambulatory Visit (INDEPENDENT_AMBULATORY_CARE_PROVIDER_SITE_OTHER): Payer: Medicare HMO | Admitting: Physician Assistant

## 2023-06-29 ENCOUNTER — Encounter: Payer: Self-pay | Admitting: Physician Assistant

## 2023-06-29 VITALS — BP 138/73 | HR 63 | Resp 13 | Ht 63.0 in | Wt 201.0 lb

## 2023-06-29 DIAGNOSIS — I10 Essential (primary) hypertension: Secondary | ICD-10-CM | POA: Diagnosis not present

## 2023-06-29 DIAGNOSIS — R911 Solitary pulmonary nodule: Secondary | ICD-10-CM | POA: Insufficient documentation

## 2023-06-29 DIAGNOSIS — Z1329 Encounter for screening for other suspected endocrine disorder: Secondary | ICD-10-CM | POA: Diagnosis not present

## 2023-06-29 DIAGNOSIS — D508 Other iron deficiency anemias: Secondary | ICD-10-CM | POA: Diagnosis not present

## 2023-06-29 DIAGNOSIS — M8588 Other specified disorders of bone density and structure, other site: Secondary | ICD-10-CM | POA: Diagnosis not present

## 2023-06-29 DIAGNOSIS — E782 Mixed hyperlipidemia: Secondary | ICD-10-CM | POA: Diagnosis not present

## 2023-06-29 DIAGNOSIS — N1831 Chronic kidney disease, stage 3a: Secondary | ICD-10-CM | POA: Diagnosis not present

## 2023-06-29 DIAGNOSIS — Z87891 Personal history of nicotine dependence: Secondary | ICD-10-CM

## 2023-06-29 DIAGNOSIS — R918 Other nonspecific abnormal finding of lung field: Secondary | ICD-10-CM

## 2023-06-29 DIAGNOSIS — Z780 Asymptomatic menopausal state: Secondary | ICD-10-CM | POA: Diagnosis not present

## 2023-06-29 MED ORDER — DOXYCYCLINE HYCLATE 100 MG PO TABS: 100.0000 mg | ORAL_TABLET | Freq: Two times a day (BID) | ORAL | 0 refills | Status: DC

## 2023-06-29 NOTE — Progress Notes (Signed)
Established Patient Office Visit  Subjective   Patient ID: Mackinzie Coldwell, female    DOB: 1950-10-13  Age: 72 y.o. MRN: [id number]  Chief Complaint  Patient presents with   Medical Management of Chronic Issues    6 mo fup mood   Hypertension    HPI Pt is a 72 yo obese female who presents to the clinic to follow up on HTN, HLD, CKD, COPD.   Pt is doing well. She feels good. No CP, palpitations, headaches or vision concerns. She is compliant with medications.   She is concerned about her CT scan for lung cancer screening wnd would like to discuss today.    .. 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   Reactive airway disease 06/09/2017   Former smoker 06/09/2017   Chronic venous stasis 12/22/2017   Muscle cramp 12/22/2017   Edema of left ankle 12/22/2017   Osteopenia 03/17/2018   SOB (shortness of breath) on exertion 01/11/2019   Depressed mood 01/11/2019   Right shoulder pain 04/12/2019   Cervical spondylosis 05/10/2019   Non-restorative sleep 08/17/2019   Snoring 08/17/2019   Class 2 obesity due to excess calories without serious comorbidity with body mass index (BMI) of 36.0 to 36.9 in adult 08/17/2019   Hyperlipidemia 08/18/2019   OSA (obstructive sleep apnea) 09/20/2019   Diarrhea 11/08/2019   Plantar fasciitis, left 01/24/2020   Acute intractable headache 01/31/2020   Vision changes 01/31/2020   Non-intractable vomiting 01/31/2020   Eye pain, right 01/31/2020   Herpes zoster with ophthalmic complication 02/03/2020   Hyponatremia 02/20/2020   Hypocalcemia 02/20/2020   Low hemoglobin 02/20/2020   AKI (acute kidney injury) (HCC) 02/21/2020   CKD (chronic kidney disease) stage 3, GFR 30-59 ml/min (HCC) 02/20/2021   Grade I diastolic dysfunction  02/26/2021   Coronary artery calcification seen on CT scan 04/17/2021   Aortic atherosclerosis (HCC) 04/17/2021   Centrilobular emphysema (HCC) 04/17/2021   Acute pain of left shoulder 06/03/2021   Bilateral leg edema 07/30/2021   Left ear impacted cerumen 07/30/2021   Vertigo 07/30/2021   Injury of right leg 10/14/2021   Seasonal allergic rhinitis due to pollen 12/24/2021   Seasonal allergies 12/24/2021   Decreased GFR 12/24/2021   Migraine without aura and without status migrainosus, not intractable 03/28/2022   Chronic allergic rhinitis 07/08/2022   Pruritus 08/05/2022   Great toe pain, right 09/03/2022   Gout 09/05/2022   Pain of great toe, left 12/12/2022   Mild intermittent asthma with exacerbation 02/04/2023   Atypical chest pain 02/10/2023   Chronic obstructive pulmonary disease (HCC) 02/10/2023   GAD (generalized anxiety disorder) 03/10/2023   Anxiety attack 03/10/2023   Excessive daytime sleepiness 03/17/2023   Fatigue 03/17/2023   Rotator cuff tear, right 04/22/2023   Opacity of lung on imaging study 06/29/2023   Pulmonary nodule 06/29/2023   Resolved Ambulatory Problems    Diagnosis Date Noted   Tobacco abuse 11/02/2013   COPD (chronic obstructive pulmonary disease) (HCC) 11/02/2013   Past Medical History:  Diagnosis Date   Asthma     Review of Systems  All other systems reviewed and are negative.     Objective:     BP 138/73   Pulse 63   Resp 13   Ht 5\' 3"  (1.6 m)  Wt 201 lb (91.2 kg)   SpO2 96%   BMI 35.61 kg/m  BP Readings from Last 3 Encounters:  06/29/23 138/73  04/07/23 (!) 134/58  03/10/23 134/63   Wt Readings from Last 3 Encounters:  06/29/23 201 lb (91.2 kg)  05/21/23 200 lb (90.7 kg)  04/07/23 200 lb 4 oz (90.8 kg)      Physical Exam Vitals reviewed.  Constitutional:      Appearance: Normal appearance. She is obese.  HENT:     Head: Normocephalic.  Cardiovascular:     Rate and Rhythm: Normal rate and regular rhythm.   Pulmonary:     Effort: Pulmonary effort is normal.     Breath sounds: Normal breath sounds.  Musculoskeletal:     Cervical back: Normal range of motion.  Neurological:     General: No focal deficit present.     Mental Status: She is alert and oriented to person, place, and time.  Psychiatric:        Mood and Affect: Mood normal.      IMPRESSION: 1. Lung-RADS 4A, suspicious. Follow up low-dose chest CT without contrast in 3 months (please use the following order, "CT CHEST LCS NODULE FOLLOW-UP W/O CM") is recommended. New posterior right upper lobe opacity which is favored to represent atelectasis, scar, or minimal infection. Correlate with infectious symptoms and consider antibiotic therapy prior to CT follow-up. 2. Aortic atherosclerosis (ICD10-I70.0), coronary artery atherosclerosis and emphysema (ICD10-J43.9).   These results will be called to the ordering clinician or representative by the Radiologist Assistant, and communication documented in the PACS or [employer]. Assessment & Plan:  Marland KitchenMarland KitchenKashauna was seen today for medical management of chronic issues and hypertension.  Diagnoses and all orders for this visit:  Pulmonary nodule  Essential hypertension, benign -     CMP14+EGFR  Post-menopausal -     VITAMIN D 25 Hydroxy (Vit-D Deficiency, Fractures)  Former smoker  Mixed hyperlipidemia -     Lipid panel  Osteopenia of lumbar spine -     VITAMIN D 25 Hydroxy (Vit-D Deficiency, Fractures)  Stage 3a chronic kidney disease (HCC) -     CMP14+EGFR  Iron deficiency anemia secondary to inadequate dietary iron intake -     CBC w/Diff/Platelet -     Fe+TIBC+Fer  Thyroid disorder screen -     TSH + free T4  Opacity of lung on imaging study -     doxycycline (VIBRA-TABS) 100 MG tablet; Take 1 tablet (100 mg total) by mouth 2 (two) times daily.   Vitals look good Pt needs fasting labs which were ordered today Discuss lung cancer screening CT results CT  scheduled for end of January Start doxycycline and good deep breath breathing     Return in about 3 months (around 09/27/2023).    Tandy Gaw, PA-C

## 2023-06-29 NOTE — Patient Instructions (Signed)
Start doxycycline for 10 days.  Repeat CT of lungs in January.  Get labs today.

## 2023-06-30 LAB — CMP14+EGFR
ALT: 11 [IU]/L (ref 0–32)
AST: 16 [IU]/L (ref 0–40)
Albumin: 4.3 g/dL (ref 3.8–4.8)
Alkaline Phosphatase: 108 [IU]/L (ref 44–121)
BUN/Creatinine Ratio: 13 (ref 12–28)
BUN: 16 mg/dL (ref 8–27)
Bilirubin Total: 0.6 mg/dL (ref 0.0–1.2)
CO2: 21 mmol/L (ref 20–29)
Calcium: 9.9 mg/dL (ref 8.7–10.3)
Chloride: 106 mmol/L (ref 96–106)
Creatinine, Ser: 1.19 mg/dL — ABNORMAL HIGH (ref 0.57–1.00)
Globulin, Total: 2.2 g/dL (ref 1.5–4.5)
Glucose: 89 mg/dL (ref 70–99)
Potassium: 5.1 mmol/L (ref 3.5–5.2)
Sodium: 142 mmol/L (ref 134–144)
Total Protein: 6.5 g/dL (ref 6.0–8.5)
eGFR: 49 mL/min/{1.73_m2} — ABNORMAL LOW (ref >59)

## 2023-06-30 LAB — CBC WITH DIFFERENTIAL/PLATELET
Basophils Absolute: 0 10*3/uL (ref 0.0–0.2)
Basos: 0 %
EOS (ABSOLUTE): 0.5 10*3/uL — ABNORMAL HIGH (ref 0.0–0.4)
Eos: 4 %
Hematocrit: 36.4 % (ref 34.0–46.6)
Hemoglobin: 11.6 g/dL (ref 11.1–15.9)
Immature Grans (Abs): 0.1 10*3/uL (ref 0.0–0.1)
Immature Granulocytes: 1 %
Lymphocytes Absolute: 2.2 10*3/uL (ref 0.7–3.1)
Lymphs: 20 %
MCH: 29.3 pg (ref 26.6–33.0)
MCHC: 31.9 g/dL (ref 31.5–35.7)
MCV: 92 fL (ref 79–97)
Monocytes Absolute: 0.6 10*3/uL (ref 0.1–0.9)
Monocytes: 5 %
Neutrophils Absolute: 8 10*3/uL — ABNORMAL HIGH (ref 1.4–7.0)
Neutrophils: 70 %
Platelets: 325 10*3/uL (ref 150–450)
RBC: 3.96 x10E6/uL (ref 3.77–5.28)
RDW: 12.4 % (ref 11.7–15.4)
WBC: 11.4 10*3/uL — ABNORMAL HIGH (ref 3.4–10.8)

## 2023-06-30 LAB — IRON,TIBC AND FERRITIN PANEL
Ferritin: 129 ng/mL (ref 15–150)
Iron Saturation: 26 % (ref 15–55)
Iron: 73 ug/dL (ref 27–139)
Total Iron Binding Capacity: 280 ug/dL (ref 250–450)
UIBC: 207 ug/dL (ref 118–369)

## 2023-06-30 LAB — LIPID PANEL
Chol/HDL Ratio: 2.4 {ratio} (ref 0.0–4.4)
Cholesterol, Total: 154 mg/dL (ref 100–199)
HDL: 64 mg/dL (ref >39)
LDL Chol Calc (NIH): 74 mg/dL (ref 0–99)
Triglycerides: 83 mg/dL (ref 0–149)
VLDL Cholesterol Cal: 16 mg/dL (ref 5–40)

## 2023-06-30 LAB — VITAMIN D 25 HYDROXY (VIT D DEFICIENCY, FRACTURES): Vit D, 25-Hydroxy: 61.6 ng/mL (ref 30.0–100.0)

## 2023-06-30 LAB — TSH+FREE T4
Free T4: 1.36 ng/dL (ref 0.82–1.77)
TSH: 2.25 u[IU]/mL (ref 0.450–4.500)

## 2023-06-30 NOTE — Progress Notes (Signed)
Gavin Pound,   Vitamin D looks great.  Cholesterol looks good.  Thyroid looks good.  Kidney function improved some!

## 2023-07-02 ENCOUNTER — Ambulatory Visit (INDEPENDENT_AMBULATORY_CARE_PROVIDER_SITE_OTHER): Payer: Medicare HMO | Admitting: Sports Medicine

## 2023-07-02 ENCOUNTER — Encounter: Payer: Self-pay | Admitting: Physician Assistant

## 2023-07-02 DIAGNOSIS — M75121 Complete rotator cuff tear or rupture of right shoulder, not specified as traumatic: Secondary | ICD-10-CM | POA: Diagnosis not present

## 2023-07-02 MED ORDER — TRAMADOL HCL 50 MG PO TABS: 50.0000 mg | ORAL_TABLET | Freq: Three times a day (TID) | ORAL | 0 refills | Status: AC | PRN

## 2023-07-02 NOTE — Assessment & Plan Note (Addendum)
Pleasant 72 year old female, she has a long history of right shoulder pain localized over the deltoid and worse with abduction. She did some home physical therapy followed by formal physical therapy. X-rays were negative. On exam she had impingement signs and profound weakness to abduction with a positive painful arc all suspicious for a large supraspinatus tear. We did a subacromial injection with ultrasound guidance, I did see some full-thickness supraspinatus tearing on ultrasound. We also obtained an MRI. The MRI was done at [facility] and did show full-thickness tearing of the supraspinatus as well as partial-thickness tearing of the subscapularis and infraspinatus, widespread osteoarthritis as well. Due to failure of conservative treatment, nonprogression with formal physical therapy we will refer to [facility] in [place] where she lives for consideration of surgical intervention. We can give her some tramadol in the meantime to hold her over.

## 2023-07-02 NOTE — Progress Notes (Signed)
    Procedures performed today:    None.  Independent interpretation of notes and tests performed by another provider:   None.  Brief History, Exam, Impression, and Recommendations:    Rotator cuff tear, right Pleasant 72 year old female, she has a long history of right shoulder pain localized over the deltoid and worse with abduction. She did some home physical therapy followed by formal physical therapy. X-rays were negative. On exam she had impingement signs and profound weakness to abduction with a positive painful arc all suspicious for a large supraspinatus tear. We did a subacromial injection with ultrasound guidance, I did see some full-thickness supraspinatus tearing on ultrasound. We also obtained an MRI. The MRI was done at [facility] and did show full-thickness tearing of the supraspinatus as well as partial-thickness tearing of the subscapularis and infraspinatus, widespread osteoarthritis as well. Due to failure of conservative treatment, nonprogression with formal physical therapy we will refer to [facility] in [place] where she lives for consideration of surgical intervention. We can give her some tramadol in the meantime to hold her over.    ____________________________________________ Ihor Austin. Benjamin Stain, M.D., ABFM., CAQSM., AME. Primary Care and Sports Medicine [facility]  Adjunct Professor of Family Medicine  [employer]  Restaurant manager, fast food

## 2023-07-03 NOTE — Telephone Encounter (Signed)
Sample given to her daughter.

## 2023-07-27 DIAGNOSIS — M75121 Complete rotator cuff tear or rupture of right shoulder, not specified as traumatic: Secondary | ICD-10-CM | POA: Diagnosis not present

## 2023-07-27 DIAGNOSIS — M19011 Primary osteoarthritis, right shoulder: Secondary | ICD-10-CM | POA: Diagnosis not present

## 2023-08-06 ENCOUNTER — Ambulatory Visit: Payer: Medicare HMO | Admitting: Nurse Practitioner

## 2023-08-11 ENCOUNTER — Ambulatory Visit (INDEPENDENT_AMBULATORY_CARE_PROVIDER_SITE_OTHER): Payer: Medicare HMO | Admitting: Physician Assistant

## 2023-08-11 ENCOUNTER — Ambulatory Visit: Payer: Medicare HMO

## 2023-08-11 ENCOUNTER — Encounter: Payer: Self-pay | Admitting: Physician Assistant

## 2023-08-11 VITALS — BP 132/74 | HR 77 | Ht 63.0 in | Wt 201.0 lb

## 2023-08-11 DIAGNOSIS — W19XXXA Unspecified fall, initial encounter: Secondary | ICD-10-CM | POA: Diagnosis not present

## 2023-08-11 DIAGNOSIS — M25562 Pain in left knee: Secondary | ICD-10-CM | POA: Diagnosis not present

## 2023-08-11 DIAGNOSIS — M79605 Pain in left leg: Secondary | ICD-10-CM

## 2023-08-11 DIAGNOSIS — M7732 Calcaneal spur, left foot: Secondary | ICD-10-CM | POA: Diagnosis not present

## 2023-08-11 MED ORDER — KETOROLAC TROMETHAMINE 60 MG/2ML IM SOLN
30.0000 mg | Freq: Once | INTRAMUSCULAR | Status: AC
  Administered 2023-08-11: 30 mg via INTRAMUSCULAR

## 2023-08-11 MED ORDER — HYDROCODONE-ACETAMINOPHEN 5-325 MG PO TABS: 1.0000 | ORAL_TABLET | Freq: Three times a day (TID) | ORAL | 0 refills | Status: AC | PRN

## 2023-08-11 NOTE — Progress Notes (Signed)
Established Patient Office Visit  Subjective   Patient ID: Ana King, female    DOB: 05-28-1951  Age: 73 y.o. MRN: [id number]  Chief Complaint  Patient presents with   Fall    Lleft leg pain from fall onset 3 days   HPI  Pt is a 73 yo female presenting to the office today with left lower leg pain and sustaining a fall on Saturday evening. Pt states that her and her husband were leaving dinner just a little before 8 pm when she tripped over a broken concrete parking block that has been kicked into the path in the parking lot. She fell and hit her left leg on the pavement. She is now complaining of pain when walking, weight-bearing and with dorsiflexion. She denies any pain in her ankle or knee besides where she has a skin abrasion on her knee. At rest her pain is about a 2/10 but after use it is an 8/10. She states she has been taking Percocet previously prescribed to her by another doctor for her previous foot injury and that has been helping.    Review of Systems  Musculoskeletal:  Positive for falls.  All other systems reviewed and are negative.  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   Reactive airway disease 06/09/2017   Former smoker 06/09/2017   Chronic venous stasis 12/22/2017   Muscle cramp 12/22/2017   Edema of left ankle 12/22/2017   Osteopenia 03/17/2018   SOB (shortness of breath) on exertion 01/11/2019   Depressed mood 01/11/2019   Cervical spondylosis 05/10/2019   Non-restorative sleep 08/17/2019   Snoring 08/17/2019   Class 2 obesity due to excess calories without serious comorbidity with body mass index (BMI) of 36.0 to 36.9 in adult 08/17/2019   Hyperlipidemia 08/18/2019   OSA (obstructive sleep apnea) 09/20/2019   Diarrhea 11/08/2019    Plantar fasciitis, left 01/24/2020   Acute intractable headache 01/31/2020   Vision changes 01/31/2020   Non-intractable vomiting 01/31/2020   Eye pain, right 01/31/2020   Herpes zoster with ophthalmic complication 02/03/2020   Hyponatremia 02/20/2020   Hypocalcemia 02/20/2020   Low hemoglobin 02/20/2020   AKI (acute kidney injury) (HCC) 02/21/2020   CKD (chronic kidney disease) stage 3, GFR 30-59 ml/min (HCC) 02/20/2021   Grade I diastolic dysfunction 02/26/2021   Coronary artery calcification seen on CT scan 04/17/2021   Aortic atherosclerosis (HCC) 04/17/2021   Centrilobular emphysema (HCC) 04/17/2021   Bilateral leg edema 07/30/2021   Left ear impacted cerumen 07/30/2021   Vertigo 07/30/2021   Injury of right leg 10/14/2021   Seasonal allergic rhinitis due to pollen 12/24/2021   Seasonal allergies 12/24/2021   Decreased GFR 12/24/2021   Migraine without aura and without status migrainosus, not intractable 03/28/2022   Chronic allergic rhinitis 07/08/2022   Pruritus 08/05/2022   Great toe pain, right 09/03/2022   Gout 09/05/2022   Pain of great toe, left 12/12/2022   Mild intermittent asthma with exacerbation 02/04/2023   Atypical chest pain 02/10/2023   Chronic obstructive pulmonary disease (HCC) 02/10/2023   GAD (generalized anxiety disorder) 03/10/2023   Anxiety attack 03/10/2023   Excessive daytime sleepiness 03/17/2023   Fatigue 03/17/2023   Rotator cuff tear, right 04/22/2023   Opacity of lung on imaging study 06/29/2023   Pulmonary nodule 06/29/2023  Resolved Ambulatory Problems    Diagnosis Date Noted   Tobacco abuse 11/02/2013   COPD (chronic obstructive pulmonary disease) (HCC) 11/02/2013   Right shoulder pain 04/12/2019   Acute pain of left shoulder 06/03/2021   Past Medical History:  Diagnosis Date   Asthma       Objective:     BP 132/74   Pulse 77   Ht 5\' 3"  (1.6 m)   Wt 201 lb (91.2 kg)   SpO2 99%   BMI 35.61 kg/m   BP Readings from  Last 3 Encounters:  08/11/23 132/74  06/29/23 138/73  04/07/23 (!) 134/58   Wt Readings from Last 3 Encounters:  08/11/23 201 lb (91.2 kg)  06/29/23 201 lb (91.2 kg)  05/21/23 200 lb (90.7 kg)    Physical Exam Constitutional:      Appearance: Normal appearance. She is obese.  HENT:     Head: Normocephalic.  Cardiovascular:     Rate and Rhythm: Normal rate and regular rhythm.     Pulses: Normal pulses.     Heart sounds: Normal heart sounds.  Pulmonary:     Effort: Pulmonary effort is normal.     Breath sounds: Normal breath sounds.  Musculoskeletal:        General: Tenderness (left knee and shin TTP) and signs of injury present.     Comments: Left knee/leg normal ROM Strength 5/5 lower extermity  Skin:    General: Skin is warm and dry.     Findings: Bruising and lesion present.     Comments: 8.5 cm x 2 cm contusion on left shin 2 cm x 2 cm skin abrasion on left knee  Neurological:     General: No focal deficit present.     Mental Status: She is alert and oriented to person, place, and time.  Psychiatric:        Mood and Affect: Mood normal.        Behavior: Behavior normal.    The 10-year ASCVD risk score (Arnett DK, et al., 2019) is: 13.6%    Assessment & Plan:   Celsey was seen today for fall.  Diagnoses and all orders for this visit:  Fall, initial encounter -     DG Tibia/Fibula Left; Future -     DG Knee Complete 4 Views Left; Future -     HYDROcodone-acetaminophen (NORCO/VICODIN) 5-325 MG tablet; Take 1 tablet by mouth every 8 (eight) hours as needed for up to 5 days for moderate pain (pain score 4-6).  Left leg pain -     DG Tibia/Fibula Left; Future -     DG Knee Complete 4 Views Left; Future -     HYDROcodone-acetaminophen (NORCO/VICODIN) 5-325 MG tablet; Take 1 tablet by mouth every 8 (eight) hours as needed for up to 5 days for moderate pain (pain score 4-6). -     ketorolac (TORADOL) injection 30 mg   Left Tibia/Fibula and knee X-rays  ordered Toradol 30 mg injection in office Norco/Vicodin sent to the pharmacy as needed for pain  Ice injured areas to help with pain and swelling Continue to move foot/leg to prevent stiffness and to help decrease swelling Try to avoid overuse of the leg to prevent further injury   Tandy Gaw, PA-C

## 2023-08-11 NOTE — Patient Instructions (Signed)
Contusion A contusion is a deep bruise. Contusions are the result of a blunt injury to tissues and muscle fibers under the skin. The injury causes bleeding under the skin. The skin over the contusion may turn blue, purple, or yellow. Minor injuries will give you a painless contusion, but more severe injuries cause contusions that can stay painful and swollen for a few weeks. Follow these instructions at home: Pay attention to any changes in your symptoms. Let your health care provider know about them. Take these actions to relieve your pain. Managing pain, stiffness, and swelling  Use resting, icing, applying pressure (compression), and raising (elevating) the injured area. This is often called the RICE method. Rest the injured area. Return to your normal activities as told by your health care provider. Ask your health care provider what activities are safe for you. If directed, put ice on the injured area. To do this: Put ice in a plastic bag. Place a towel between your skin and the bag. Leave the ice on for 20 minutes, 2-3 times a day. If your skin turns bright red, remove the ice right away to prevent skin damage. The risk of skin damage is higher if you cannot feel pain, heat, or cold. If directed, apply light compression to the injured area using an elastic bandage. Make sure the bandage is not wrapped too tightly. Remove and reapply the bandage as directed by your health care provider. If possible, elevate the injured area above the level of your heart while you are sitting or lying down. General instructions Take over-the-counter and prescription medicines only as told by your health care provider. Keep all follow-up visits. Your health care provider may want to see how your contusion is healing with treatment. Contact a health care provider if: Your symptoms do not improve after several days of treatment. Your symptoms get worse. You have difficulty moving the injured area. Get help  right away if: You have severe pain. You have numbness in a hand or foot. Your hand or foot turns pale or cold. This information is not intended to replace advice given to you by your health care provider. Make sure you discuss any questions you have with your health care provider. Document Revised: 12/23/2021 Document Reviewed: 12/23/2021 Elsevier Patient Education  2024 [employer].

## 2023-08-11 NOTE — Progress Notes (Signed)
I can see some damage to fibula(bone bruise) right over where you hurt but it is small and will heal fast. Treatment plan can stay the same.

## 2023-08-28 ENCOUNTER — Ambulatory Visit: Payer: Medicare HMO

## 2023-09-01 ENCOUNTER — Ambulatory Visit (INDEPENDENT_AMBULATORY_CARE_PROVIDER_SITE_OTHER): Payer: Medicare HMO

## 2023-09-01 DIAGNOSIS — I700 Atherosclerosis of aorta: Secondary | ICD-10-CM | POA: Diagnosis not present

## 2023-09-01 DIAGNOSIS — Z122 Encounter for screening for malignant neoplasm of respiratory organs: Secondary | ICD-10-CM

## 2023-09-01 DIAGNOSIS — J439 Emphysema, unspecified: Secondary | ICD-10-CM

## 2023-09-01 DIAGNOSIS — R918 Other nonspecific abnormal finding of lung field: Secondary | ICD-10-CM | POA: Diagnosis not present

## 2023-09-01 DIAGNOSIS — Z87891 Personal history of nicotine dependence: Secondary | ICD-10-CM

## 2023-09-01 DIAGNOSIS — J432 Centrilobular emphysema: Secondary | ICD-10-CM | POA: Diagnosis not present

## 2023-09-07 DIAGNOSIS — J449 Chronic obstructive pulmonary disease, unspecified: Secondary | ICD-10-CM | POA: Diagnosis not present

## 2023-09-07 DIAGNOSIS — J302 Other seasonal allergic rhinitis: Secondary | ICD-10-CM | POA: Diagnosis not present

## 2023-09-07 DIAGNOSIS — J4550 Severe persistent asthma, uncomplicated: Secondary | ICD-10-CM | POA: Diagnosis not present

## 2023-09-14 ENCOUNTER — Other Ambulatory Visit: Payer: Self-pay | Admitting: Physician Assistant

## 2023-09-14 DIAGNOSIS — F411 Generalized anxiety disorder: Secondary | ICD-10-CM

## 2023-09-14 DIAGNOSIS — F410 Panic disorder [episodic paroxysmal anxiety] without agoraphobia: Secondary | ICD-10-CM

## 2023-09-16 ENCOUNTER — Telehealth: Payer: Self-pay | Admitting: Acute Care

## 2023-09-16 DIAGNOSIS — Z87891 Personal history of nicotine dependence: Secondary | ICD-10-CM

## 2023-09-16 DIAGNOSIS — R911 Solitary pulmonary nodule: Secondary | ICD-10-CM

## 2023-09-16 NOTE — Telephone Encounter (Signed)
 Patient returned call. Patient states she hasn't been sick recently. Patient Ana King any acute s/s. Will relay information to [name] to determine follow up scan.

## 2023-09-16 NOTE — Telephone Encounter (Signed)
 Patient had LDCT on 09/01/2023 that resulted as an LR0, incomplete. Reviewed results with Kandice Robinsons NP and informed her that pt is asymptomatic. [name] recommends a 1 month follow up scan to reassess new 11.72mm nodule.

## 2023-09-16 NOTE — Telephone Encounter (Signed)
 Called and left VM for pt

## 2023-09-16 NOTE — Telephone Encounter (Signed)
 Called and left VM for pt. Will need to see if she is sick.     IMPRESSION: 1. Waxing and waning nodular consolidation in the lungs, likely infectious or inflammatory in etiology. Lung-RADS 0, incomplete. Recommend follow-up low-dose lung cancer screening CT in 1 month, after appropriate clinical therapy, as malignancy cannot be excluded. These results will be called to the ordering clinician or representative by the Radiologist Assistant, and communication documented in the PACS or [employer]. 2. Aortic atherosclerosis (ICD10-I70.0). Coronary artery calcification. 3. Enlarged pulmonic trunk, indicative of pulmonary arterial hypertension. 4.  Emphysema (ICD10-J43.9).     Electronically Signed   By: Leanna Battles M.D.   On: 09/16/2023 10:58

## 2023-09-17 NOTE — Telephone Encounter (Signed)
 Spoke with pt and reviewed CT results. Pt reports that she has only had allergy symptoms recently. I advised pt that I would send CT report to Tandy Gaw, PA for her to decide if pt would need treatment prior to her repeat CT in 1 month, Pt verbalized understanding. F/U CT scheduled 09/30/23 2:30.

## 2023-09-17 NOTE — Telephone Encounter (Signed)
 Called and left VM for pt to call regarding CT results.

## 2023-09-22 ENCOUNTER — Other Ambulatory Visit: Payer: Self-pay

## 2023-09-22 MED ORDER — AZITHROMYCIN 250 MG PO TABS: ORAL_TABLET | ORAL | 0 refills | Status: DC

## 2023-09-22 NOTE — Addendum Note (Signed)
 Addended by: [name] on: 09/22/2023 03:28 PM   Modules accepted: Orders

## 2023-09-23 ENCOUNTER — Ambulatory Visit (INDEPENDENT_AMBULATORY_CARE_PROVIDER_SITE_OTHER): Payer: Medicare HMO | Admitting: Physician Assistant

## 2023-09-23 VITALS — BP 102/67 | HR 69 | Wt 197.0 lb

## 2023-09-23 DIAGNOSIS — I10 Essential (primary) hypertension: Secondary | ICD-10-CM

## 2023-09-23 DIAGNOSIS — R232 Flushing: Secondary | ICD-10-CM

## 2023-09-23 DIAGNOSIS — I959 Hypotension, unspecified: Secondary | ICD-10-CM

## 2023-09-23 DIAGNOSIS — J181 Lobar pneumonia, unspecified organism: Secondary | ICD-10-CM

## 2023-09-23 DIAGNOSIS — R918 Other nonspecific abnormal finding of lung field: Secondary | ICD-10-CM | POA: Diagnosis not present

## 2023-09-23 NOTE — Progress Notes (Signed)
 Established Patient Office Visit  Subjective   Patient ID: Ana King, female    DOB: January 15, 1951  Age: 73 y.o. MRN: [id number]  CC: 3 month follow up   HPI Patient is a 73 yo obese female with HTN, HLD, CKD, COPD, and pulmonary nodule who presents for a 3 month follow up visit. She is here for a blood pressure check up today. She takes her BP medication daily and checks her Bps at home.   Patient reports an episode of dizziness, hot flashes, and shortness of breath. She denies chest pain with this incident but she did take her BP and it was 92/77mmHg. She states this happened Saturday 09/19/23. She did eat and was hydrated. She experiences the shortness of breath on exertion and when she gets "attacks." She is continuing to use her inhaler.   She has a low dose CT scan done on 09/16/23 which showed nodular consolidations bilaterally with a new nodularity measuring 11.15mm in the posteromedial right upper lobe.   .. 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   Reactive airway disease 06/09/2017   Former smoker 06/09/2017   Chronic venous stasis 12/22/2017   Muscle cramp 12/22/2017   Edema of left ankle 12/22/2017   Osteopenia 03/17/2018   SOB (shortness of breath) on exertion 01/11/2019   Depressed mood 01/11/2019   Cervical spondylosis 05/10/2019   Non-restorative sleep 08/17/2019   Snoring 08/17/2019   Class 2 obesity due to excess calories without serious comorbidity with body mass index (BMI) of 36.0 to 36.9 in adult 08/17/2019   Hyperlipidemia 08/18/2019   OSA (obstructive sleep apnea) 09/20/2019   Diarrhea 11/08/2019   Plantar fasciitis, left 01/24/2020   Acute intractable headache 01/31/2020   Vision changes 01/31/2020   Non-intractable vomiting 01/31/2020    Eye pain, right 01/31/2020   Herpes zoster with ophthalmic complication 02/03/2020   Hyponatremia 02/20/2020   Hypocalcemia 02/20/2020   Low hemoglobin 02/20/2020   AKI (acute kidney injury) (HCC) 02/21/2020   CKD (chronic kidney disease) stage 3, GFR 30-59 ml/min (HCC) 02/20/2021   Grade I diastolic dysfunction 02/26/2021   Coronary artery calcification seen on CT scan 04/17/2021   Aortic atherosclerosis (HCC) 04/17/2021   Centrilobular emphysema (HCC) 04/17/2021   Bilateral leg edema 07/30/2021   Left ear impacted cerumen 07/30/2021   Vertigo 07/30/2021   Injury of right leg 10/14/2021   Seasonal allergic rhinitis due to pollen 12/24/2021   Seasonal allergies 12/24/2021   Decreased GFR 12/24/2021   Migraine without aura and without status migrainosus, not intractable 03/28/2022   Chronic allergic rhinitis 07/08/2022   Pruritus 08/05/2022   Great toe pain, right 09/03/2022   Gout 09/05/2022   Pain of great toe, left 12/12/2022   Mild intermittent asthma with exacerbation 02/04/2023   Atypical chest pain 02/10/2023   Chronic obstructive pulmonary disease (HCC) 02/10/2023   GAD (generalized anxiety disorder) 03/10/2023   Anxiety attack 03/10/2023   Excessive daytime sleepiness 03/17/2023   Fatigue 03/17/2023   Rotator cuff tear, right 04/22/2023   Opacity of lung on imaging study 06/29/2023   Pulmonary nodule 06/29/2023   Hypotension 09/23/2023   Lung consolidation (HCC) 09/23/2023   Abnormal CT lung screening 09/23/2023   Hot flashes 09/23/2023   Resolved Ambulatory Problems    Diagnosis Date Noted  Tobacco abuse 11/02/2013   COPD (chronic obstructive pulmonary disease) (HCC) 11/02/2013   Right shoulder pain 04/12/2019   Acute pain of left shoulder 06/03/2021   Past Medical History:  Diagnosis Date   Asthma     ROS See HPI   Objective:    BP 102/67 (BP Location: Right Arm, Patient Position: Sitting, Cuff Size: Normal)   Pulse 69   Wt 197 lb (89.4 kg)    SpO2 96%   BMI 34.90 kg/m  BP Readings from Last 3 Encounters:  09/23/23 102/67  08/11/23 132/74  06/29/23 138/73   Wt Readings from Last 3 Encounters:  09/23/23 197 lb (89.4 kg)  08/11/23 201 lb (91.2 kg)  06/29/23 201 lb (91.2 kg)   SpO2 Readings from Last 3 Encounters:  09/23/23 96%  08/11/23 99%  06/29/23 96%   Physical Exam Constitutional:      Appearance: She is obese.  HENT:     Head: Normocephalic and atraumatic.  Eyes:     Extraocular Movements: Extraocular movements intact.  Cardiovascular:     Rate and Rhythm: Normal rate and regular rhythm.     Heart sounds: Normal heart sounds.  Pulmonary:     Effort: Pulmonary effort is normal.     Breath sounds: Normal breath sounds.  Musculoskeletal:        General: No swelling or tenderness. Normal range of motion.     Cervical back: Normal range of motion.  Skin:    General: Skin is warm.  Neurological:     General: No focal deficit present.     Mental Status: She is alert and oriented to person, place, and time.  Psychiatric:        Mood and Affect: Mood normal.        Behavior: Behavior normal.      Assessment & Plan:  .Diagnoses and all orders for this visit:  Hypotension, unspecified hypotension type  Abnormal CT lung screening  Lung consolidation (HCC)  Hot flashes  Essential hypertension, benign    - Discussed low dose CT scan with patient that she had done on 09/16/23: Centrilobular emphysema. Waxing and waning nodular consolidation bilaterally, with new nodularity measuring up to 11.8 mm in the posteromedial right upper lobe - Start zpak for lung infection so that perhaps will clear before her next CT scan - BP too low. Cut back to 5mg  of lisinopril daily.  - Nurse visit in 2 weeks to recheck BP    Tandy Gaw, PA-C

## 2023-09-23 NOTE — Patient Instructions (Addendum)
 Cut lisinopril in half to 5mg . Keep checking BP at home.  Recheck in 2 weeks.   Finish Zpack and recheck CT will be looking for results.

## 2023-09-28 ENCOUNTER — Encounter: Payer: Self-pay | Admitting: Physician Assistant

## 2023-09-28 ENCOUNTER — Ambulatory Visit: Payer: Medicare HMO | Admitting: Physician Assistant

## 2023-09-30 ENCOUNTER — Ambulatory Visit: Payer: Medicare HMO

## 2023-09-30 DIAGNOSIS — I700 Atherosclerosis of aorta: Secondary | ICD-10-CM | POA: Diagnosis not present

## 2023-09-30 DIAGNOSIS — Z87891 Personal history of nicotine dependence: Secondary | ICD-10-CM

## 2023-09-30 DIAGNOSIS — R911 Solitary pulmonary nodule: Secondary | ICD-10-CM

## 2023-09-30 DIAGNOSIS — J439 Emphysema, unspecified: Secondary | ICD-10-CM | POA: Diagnosis not present

## 2023-10-08 ENCOUNTER — Encounter: Payer: Self-pay | Admitting: Family Medicine

## 2023-10-08 ENCOUNTER — Ambulatory Visit (INDEPENDENT_AMBULATORY_CARE_PROVIDER_SITE_OTHER): Admitting: Family Medicine

## 2023-10-08 VITALS — BP 136/75 | HR 81 | Ht 63.0 in | Wt 197.5 lb

## 2023-10-08 DIAGNOSIS — I959 Hypotension, unspecified: Secondary | ICD-10-CM

## 2023-10-08 NOTE — Progress Notes (Signed)
 Pt here today to have BP checked. Last OV Ana King was concerned about it being to low and she had pt decrease her lisinopril from 10mg  to 5mg .                   Pt BP was taken and was 138/78. And 136/75 the 2nd time. Per Dr. Linford Arnold advised pt stay on the 5mg  of linsinopril and RTC in 2 months for nurse visit pt voiced her understanding.

## 2023-10-09 DIAGNOSIS — M7521 Bicipital tendinitis, right shoulder: Secondary | ICD-10-CM | POA: Diagnosis not present

## 2023-10-09 DIAGNOSIS — M75121 Complete rotator cuff tear or rupture of right shoulder, not specified as traumatic: Secondary | ICD-10-CM | POA: Diagnosis not present

## 2023-10-09 DIAGNOSIS — M19011 Primary osteoarthritis, right shoulder: Secondary | ICD-10-CM | POA: Diagnosis not present

## 2023-10-09 DIAGNOSIS — Z1330 Encounter for screening examination for mental health and behavioral disorders, unspecified: Secondary | ICD-10-CM | POA: Diagnosis not present

## 2023-10-12 ENCOUNTER — Other Ambulatory Visit: Payer: Self-pay | Admitting: Physician Assistant

## 2023-10-12 DIAGNOSIS — J4521 Mild intermittent asthma with (acute) exacerbation: Secondary | ICD-10-CM

## 2023-10-21 ENCOUNTER — Telehealth: Payer: Self-pay

## 2023-10-21 NOTE — Telephone Encounter (Signed)
 Copied from CRM [id number]. Topic: Clinical - Lab/Test Results >> Oct 20, 2023  1:45 PM Isabell A wrote: Reason for CRM: Patient is requesting to speak to someone in regards to her most recent lung screening for the lung nodule.   Callback number: [phone]  I will route to the Lung Cancer Screening nurses.

## 2023-10-21 NOTE — Telephone Encounter (Signed)
 Called and spoke with patient who is inquiring about LDCT results.  Results have not yet been released. Advised we have had some delay of several weeks in getting our scans read.  Will notify her once we have received results and she will also be able to see the result in her mychart and know we have received it as well.  Patient acknowledged understanding.

## 2023-10-26 ENCOUNTER — Telehealth: Payer: Self-pay | Admitting: *Deleted

## 2023-10-26 NOTE — Telephone Encounter (Signed)
 Call Report:  IMPRESSION: 1. Lung-RADS 4As, suspicious. Follow up low-dose chest CT without contrast in 3 months (please use the following order, "CT CHEST LCS NODULE FOLLOW-UP W/O CM") is recommended. Alternatively, PET may be considered when there is a solid component 8mm or larger. 2. S modifier refers to the dominant nodule within the posteromedial right upper lobe which is stable to slightly decreased in size in the interval. Continued short-term follow-up imaging is advised to confirm stability or resolution of this nodule. 3. Coronary artery calcifications. 4. Aortic Atherosclerosis (ICD10-I70.0) and Emphysema (ICD10-J43.9).

## 2023-10-26 NOTE — Telephone Encounter (Signed)
 [facility] calling w/call report.

## 2023-10-28 ENCOUNTER — Telehealth: Payer: Self-pay | Admitting: Acute Care

## 2023-10-28 NOTE — Telephone Encounter (Signed)
 Ladies, ok to call patient and let them know the nodule of concern in the Right Upper Lobe ( Previously read as a LR 0 08/2023) has decreased in size from 11.8 mm to 10.4 mm.  More likely infectious or inflammatory, but read as a 4 A. Please order 3 month follow up due 01/01/2024. Please fax results to PCP and let them know plan of care. Thanks so much.

## 2023-10-29 ENCOUNTER — Other Ambulatory Visit: Payer: Self-pay

## 2023-10-29 DIAGNOSIS — Z122 Encounter for screening for malignant neoplasm of respiratory organs: Secondary | ICD-10-CM

## 2023-10-29 DIAGNOSIS — R911 Solitary pulmonary nodule: Secondary | ICD-10-CM

## 2023-10-29 DIAGNOSIS — Z87891 Personal history of nicotine dependence: Secondary | ICD-10-CM

## 2023-10-29 NOTE — Telephone Encounter (Signed)
 See telephone note from 10/28/2023

## 2023-10-29 NOTE — Telephone Encounter (Signed)
 I have called and spoken with the patient. Reviewed results. Advised the nodule of concern in the Right Upper Lobe ( Previously read as a LR 0 08/2023) has decreased in size from 11.8 mm to 10.4 mm.  More likely infectious or inflammatory, but read as a 4 A. 3 Month order has been placed and she has been scheduled for 01/01/2024 at [facility]. No additional concerns. Results and plan have been faxed to PCP.

## 2023-10-30 ENCOUNTER — Encounter: Payer: Self-pay | Admitting: Physician Assistant

## 2023-10-30 ENCOUNTER — Ambulatory Visit (INDEPENDENT_AMBULATORY_CARE_PROVIDER_SITE_OTHER): Admitting: Physician Assistant

## 2023-10-30 VITALS — BP 147/85 | HR 83 | Temp 98.3°F | Ht 63.0 in | Wt 197.0 lb

## 2023-10-30 DIAGNOSIS — J309 Allergic rhinitis, unspecified: Secondary | ICD-10-CM

## 2023-10-30 DIAGNOSIS — R6889 Other general symptoms and signs: Secondary | ICD-10-CM

## 2023-10-30 MED ORDER — HYDROCODONE BIT-HOMATROP MBR 5-1.5 MG/5ML PO SOLN: 5.0000 mL | Freq: Three times a day (TID) | ORAL | 0 refills | Status: DC | PRN

## 2023-10-30 MED ORDER — METHYLPREDNISOLONE 4 MG PO TBPK: ORAL_TABLET | ORAL | 0 refills | Status: DC

## 2023-10-30 MED ORDER — DOXYCYCLINE HYCLATE 100 MG PO TABS: 100.0000 mg | ORAL_TABLET | Freq: Two times a day (BID) | ORAL | 0 refills | Status: DC

## 2023-10-30 NOTE — Progress Notes (Signed)
 Acute Office Visit  Subjective:     Patient ID: Ana King, female    DOB: [birth year], 73 y.o.   MRN: [id number]  Chief Complaint  Patient presents with   Cough    Flu like symptoms     HPI Patient is in today for cough, sinus pressure, body aches, runny nose, congestion for over a week. Pt has COPD and OSA. She is taking dayquil and nyquil which helps some. She has allergies and thought this was more allergies but would not improve. She denies any problems breathing but concerned about "it moving to her chest".   .. 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   Reactive airway disease 06/09/2017   Former smoker 06/09/2017   Chronic venous stasis 12/22/2017   Muscle cramp 12/22/2017   Edema of left ankle 12/22/2017   Osteopenia 03/17/2018   SOB (shortness of breath) on exertion 01/11/2019   Depressed mood 01/11/2019   Cervical spondylosis 05/10/2019   Non-restorative sleep 08/17/2019   Snoring 08/17/2019   Class 2 obesity due to excess calories without serious comorbidity with body mass index (BMI) of 36.0 to 36.9 in adult 08/17/2019   Hyperlipidemia 08/18/2019   OSA (obstructive sleep apnea) 09/20/2019   Diarrhea 11/08/2019   Plantar fasciitis, left 01/24/2020   Acute intractable headache 01/31/2020   Vision changes 01/31/2020   Non-intractable vomiting 01/31/2020   Eye pain, right 01/31/2020   Herpes zoster with ophthalmic complication 02/03/2020   Hyponatremia 02/20/2020   Hypocalcemia 02/20/2020   Low hemoglobin 02/20/2020   AKI (acute kidney injury) (HCC) 02/21/2020   CKD (chronic kidney disease) stage 3, GFR 30-59 ml/min (HCC) 02/20/2021   Grade I diastolic dysfunction 02/26/2021   Coronary artery calcification seen on CT scan 04/17/2021    Aortic atherosclerosis (HCC) 04/17/2021   Centrilobular emphysema (HCC) 04/17/2021   Bilateral leg edema 07/30/2021   Left ear impacted cerumen 07/30/2021   Vertigo 07/30/2021   Injury of right leg 10/14/2021   Seasonal allergic rhinitis due to pollen 12/24/2021   Seasonal allergies 12/24/2021   Decreased GFR 12/24/2021   Migraine without aura and without status migrainosus, not intractable 03/28/2022   Chronic allergic rhinitis 07/08/2022   Pruritus 08/05/2022   Great toe pain, right 09/03/2022   Gout 09/05/2022   Pain of great toe, left 12/12/2022   Mild intermittent asthma with exacerbation 02/04/2023   Atypical chest pain 02/10/2023   Chronic obstructive pulmonary disease (HCC) 02/10/2023   GAD (generalized anxiety disorder) 03/10/2023   Anxiety attack 03/10/2023   Excessive daytime sleepiness 03/17/2023   Fatigue 03/17/2023   Rotator cuff tear, right 04/22/2023   Opacity of lung on imaging study 06/29/2023   Pulmonary nodule 06/29/2023   Hypotension 09/23/2023   Lung consolidation (HCC) 09/23/2023   Abnormal CT lung screening 09/23/2023   Hot flashes 09/23/2023   Resolved Ambulatory Problems    Diagnosis Date Noted   Tobacco abuse 11/02/2013   COPD (chronic obstructive pulmonary disease) (HCC) 11/02/2013   Right shoulder pain 04/12/2019   Acute pain of left shoulder 06/03/2021   Past Medical History:  Diagnosis Date   Asthma      ROS See HPI.      Objective:    BP (!) 147/85   Pulse 83   Temp 98.3 F (36.8 C) (  Oral)   Ht 5\' 3"  (1.6 m)   Wt 197 lb (89.4 kg)   SpO2 99%   BMI 34.90 kg/m  BP Readings from Last 3 Encounters:  10/30/23 (!) 147/85  10/08/23 136/75  09/23/23 102/67   Wt Readings from Last 3 Encounters:  10/30/23 197 lb (89.4 kg)  10/08/23 197 lb 8 oz (89.6 kg)  09/30/23 197 lb (89.4 kg)      Physical Exam Constitutional:      Appearance: Normal appearance. She is obese.  HENT:     Head: Normocephalic.     Right Ear: Ear canal  and external ear normal. There is no impacted cerumen.     Left Ear: Ear canal and external ear normal. There is no impacted cerumen.     Ears:     Comments: TMs erythematous and dull.     Nose: Congestion and rhinorrhea present.     Mouth/Throat:     Pharynx: Posterior oropharyngeal erythema present. No oropharyngeal exudate.  Eyes:     Extraocular Movements: Extraocular movements intact.     Conjunctiva/sclera: Conjunctivae normal.     Pupils: Pupils are equal, round, and reactive to light.     Comments: Watery eyes.  Cardiovascular:     Rate and Rhythm: Normal rate.  Pulmonary:     Effort: Pulmonary effort is normal.     Breath sounds: Normal breath sounds. No wheezing or rhonchi.  Musculoskeletal:     Cervical back: Normal range of motion and neck supple. Tenderness present.  Lymphadenopathy:     Cervical: Cervical adenopathy present.  Neurological:     General: No focal deficit present.     Mental Status: She is alert and oriented to person, place, and time.  Psychiatric:        Mood and Affect: Mood normal.    .. Results for orders placed or performed in visit on 10/30/23  POC COVID-19 BinaxNow   Collection Time: 11/02/23  8:13 AM  Result Value Ref Range   SARS Coronavirus 2 Ag Negative Negative  POCT Influenza A/B   Collection Time: 11/02/23  8:13 AM  Result Value Ref Range   Influenza A, POC Negative Negative   Influenza B, POC Negative Negative  POCT rapid strep A   Collection Time: 11/02/23  8:13 AM  Result Value Ref Range   Rapid Strep A Screen Negative Negative          Assessment & Plan:  Aaron AasAaron AasAerith was seen today for cough.  Diagnoses and all orders for this visit:  Allergic sinusitis -     methylPREDNISolone (MEDROL DOSEPAK) 4 MG TBPK tablet; Take as directed by package insert. -     doxycycline (VIBRA-TABS) 100 MG tablet; Take 1 tablet (100 mg total) by mouth 2 (two) times daily. -     HYDROcodone bit-homatropine (HYCODAN) 5-1.5 MG/5ML syrup;  Take 5 mLs by mouth every 8 (eight) hours as needed.  Flu-like symptoms -     POC COVID-19 BinaxNow -     POCT Influenza A/B -     POCT rapid strep A   Likely allergies into secondary sinusitis Flu/covid/strep negative.  Lungs sound good today.  Start doxycycline, medrol dose pack.  Hycodan for cough at bedtime.  Albuterol as needed.  Follow up as needed if symptoms persist or worsen.   Rayssa Atha, PA-C

## 2023-10-30 NOTE — Patient Instructions (Signed)
 Sounds like more allergic sinusitis Stay on allergy medication Start medrol dose pack and cough syrup Start antibiotic only if not improving or worsening  Sinus Infection, Adult A sinus infection, also called sinusitis, is inflammation of your sinuses. Sinuses are hollow spaces in the bones around your face. Your sinuses are located: Around your eyes. In the middle of your forehead. Behind your nose. In your cheekbones. Mucus normally drains out of your sinuses. When your nasal tissues become inflamed or swollen, mucus can become trapped or blocked. This allows bacteria, viruses, and fungi to grow, which leads to infection. Most infections of the sinuses are caused by a virus. A sinus infection can develop quickly. It can last for up to 4 weeks (acute) or for more than 12 weeks (chronic). A sinus infection often develops after a cold. What are the causes? This condition is caused by anything that creates swelling in the sinuses or stops mucus from draining. This includes: Allergies. Asthma. Infection from bacteria or viruses. Deformities or blockages in your nose or sinuses. Abnormal growths in the nose (nasal polyps). Pollutants, such as chemicals or irritants in the air. Infection from fungi. This is rare. What increases the risk? You are more likely to develop this condition if you: Have a weak body defense system (immune system). Do a lot of swimming or diving. Overuse nasal sprays. Smoke. What are the signs or symptoms? The main symptoms of this condition are pain and a feeling of pressure around the affected sinuses. Other symptoms include: Stuffy nose or congestion that makes it difficult to breathe through your nose. Thick yellow or greenish drainage from your nose. Tenderness, swelling, and warmth over the affected sinuses. A cough that may get worse at night. Decreased sense of smell and taste. Extra mucus that collects in the throat or the back of the nose (postnasal  drip) causing a sore throat or bad breath. Tiredness (fatigue). Fever. How is this diagnosed? This condition is diagnosed based on: Your symptoms. Your medical history. A physical exam. Tests to find out if your condition is acute or chronic. This may include: Checking your nose for nasal polyps. Viewing your sinuses using a device that has a light (endoscope). Testing for allergies or bacteria. Imaging tests, such as an MRI or CT scan. In rare cases, a bone biopsy may be done to rule out more serious types of fungal sinus disease. How is this treated? Treatment for a sinus infection depends on the cause and whether your condition is chronic or acute. If caused by a virus, your symptoms should go away on their own within 10 days. You may be given medicines to relieve symptoms. They include: Medicines that shrink swollen nasal passages (decongestants). A spray that eases inflammation of the nostrils (topical intranasal corticosteroids). Rinses that help get rid of thick mucus in your nose (nasal saline washes). Medicines that treat allergies (antihistamines). Over-the-counter pain relievers. If caused by bacteria, your health care provider may recommend waiting to see if your symptoms improve. Most bacterial infections will get better without antibiotic medicine. You may be given antibiotics if you have: A severe infection. A weak immune system. If caused by narrow nasal passages or nasal polyps, surgery may be needed. Follow these instructions at home: Medicines Take, use, or apply over-the-counter and prescription medicines only as told by your health care provider. These may include nasal sprays. If you were prescribed an antibiotic medicine, take it as told by your health care provider. Do not stop taking the  antibiotic even if you start to feel better. Hydrate and humidify  Drink enough fluid to keep your urine pale yellow. Staying hydrated will help to thin your mucus. Use a  cool mist humidifier to keep the humidity level in your home above 50%. Inhale steam for 10-15 minutes, 3-4 times a day, or as told by your health care provider. You can do this in the bathroom while a hot shower is running. Limit your exposure to cool or dry air. Rest Rest as much as possible. Sleep with your head raised (elevated). Make sure you get enough sleep each night. General instructions  Apply a warm, moist washcloth to your face 3-4 times a day or as told by your health care provider. This will help with discomfort. Use nasal saline washes as often as told by your health care provider. Wash your hands often with soap and water to reduce your exposure to germs. If soap and water are not available, use hand sanitizer. Do not smoke. Avoid being around people who are smoking (secondhand smoke). Keep all follow-up visits. This is important. Contact a health care provider if: You have a fever. Your symptoms get worse. Your symptoms do not improve within 10 days. Get help right away if: You have a severe headache. You have persistent vomiting. You have severe pain or swelling around your face or eyes. You have vision problems. You develop confusion. Your neck is stiff. You have trouble breathing. These symptoms may be an emergency. Get help right away. Call 911. Do not wait to see if the symptoms will go away. Do not drive yourself to the hospital. Summary A sinus infection is soreness and inflammation of your sinuses. Sinuses are hollow spaces in the bones around your face. This condition is caused by nasal tissues that become inflamed or swollen. The swelling traps or blocks the flow of mucus. This allows bacteria, viruses, and fungi to grow, which leads to infection. If you were prescribed an antibiotic medicine, take it as told by your health care provider. Do not stop taking the antibiotic even if you start to feel better. Keep all follow-up visits. This is important. This  information is not intended to replace advice given to you by your health care provider. Make sure you discuss any questions you have with your health care provider. Document Revised: 06/11/2021 Document Reviewed: 06/11/2021 Elsevier Patient Education  2024 [employer].

## 2023-11-02 LAB — POCT INFLUENZA A/B
Influenza A, POC: NEGATIVE
Influenza B, POC: NEGATIVE

## 2023-11-02 LAB — POCT RAPID STREP A (OFFICE): Rapid Strep A Screen: NEGATIVE

## 2023-11-02 LAB — POC COVID19 BINAXNOW: SARS Coronavirus 2 Ag: NEGATIVE

## 2023-11-09 ENCOUNTER — Other Ambulatory Visit: Payer: Self-pay | Admitting: Physician Assistant

## 2023-11-09 DIAGNOSIS — Z1231 Encounter for screening mammogram for malignant neoplasm of breast: Secondary | ICD-10-CM

## 2023-11-09 DIAGNOSIS — Z0000 Encounter for general adult medical examination without abnormal findings: Secondary | ICD-10-CM

## 2023-12-11 ENCOUNTER — Ambulatory Visit (INDEPENDENT_AMBULATORY_CARE_PROVIDER_SITE_OTHER)

## 2023-12-11 VITALS — BP 115/72 | HR 64 | Ht 63.0 in | Wt 195.0 lb

## 2023-12-11 DIAGNOSIS — I10 Essential (primary) hypertension: Secondary | ICD-10-CM

## 2023-12-11 NOTE — Progress Notes (Unsigned)
 Patient is here for blood pressure check.   Last OV on 10/08/23:  Pt BP was taken and was 138/78. And 136/75 the 2nd time. Per Dr. Greer Leak advised pt stay on the 5mg  of linsinopril and RTC in 2 months for nurse visit pt voiced her understanding.    Previous BP was 147/85 oin 10/30/23  1st BP today: 115/72  Denies chest pain, dizziness, shortness of breath, severe headache, or nosebleeds. Taking medication as prescribed. Denies missed doses.    Pt to continue with current medication plan per Dr. Elva Hamburger. Hydrating continually.

## 2023-12-29 ENCOUNTER — Ambulatory Visit

## 2023-12-29 DIAGNOSIS — Z87891 Personal history of nicotine dependence: Secondary | ICD-10-CM

## 2023-12-29 DIAGNOSIS — Z122 Encounter for screening for malignant neoplasm of respiratory organs: Secondary | ICD-10-CM | POA: Diagnosis not present

## 2023-12-29 DIAGNOSIS — R911 Solitary pulmonary nodule: Secondary | ICD-10-CM | POA: Diagnosis not present

## 2024-01-01 ENCOUNTER — Encounter: Payer: Self-pay | Admitting: Physician Assistant

## 2024-01-01 ENCOUNTER — Ambulatory Visit

## 2024-01-01 ENCOUNTER — Ambulatory Visit (INDEPENDENT_AMBULATORY_CARE_PROVIDER_SITE_OTHER): Admitting: Physician Assistant

## 2024-01-01 VITALS — BP 135/53 | HR 55 | Ht 63.0 in | Wt 194.0 lb

## 2024-01-01 DIAGNOSIS — I10 Essential (primary) hypertension: Secondary | ICD-10-CM | POA: Diagnosis not present

## 2024-01-01 DIAGNOSIS — I959 Hypotension, unspecified: Secondary | ICD-10-CM | POA: Diagnosis not present

## 2024-01-01 DIAGNOSIS — W19XXXA Unspecified fall, initial encounter: Secondary | ICD-10-CM | POA: Diagnosis not present

## 2024-01-01 DIAGNOSIS — J449 Chronic obstructive pulmonary disease, unspecified: Secondary | ICD-10-CM

## 2024-01-01 MED ORDER — LISINOPRIL 10 MG PO TABS: 10.0000 mg | ORAL_TABLET | Freq: Every day | ORAL | 1 refills | Status: DC

## 2024-01-01 NOTE — Patient Instructions (Signed)
 Hypotension As the heart beats, it forces blood through the body. Hypotension, commonly called low blood pressure, is when the force of blood pumping through the arteries is too weak. Arteries are blood vessels that carry blood from the heart throughout the body. Depending on the cause and severity, hypotension may be harmless (benign) or may cause serious problems (be critical). When your blood pressure is too low, you may not get enough blood to your brain or to the rest of your organs. This can cause weakness, light-headedness, a rapid heartbeat, and fainting. What are the causes? This condition may be caused by: Blood loss. Loss of body fluids (dehydration). Heart problems. Hormone (endocrine) problems. Pregnancy. Severe infection. Lack of certain nutrients. Severe allergic reactions (anaphylaxis). Certain medicines, such as blood pressure medicine or medicines that make the body lose excess fluids (diuretics). Sometimes, hypotension may be caused by not taking medicine as directed, such as taking too much of a certain medicine. What increases the risk? The following factors may make you more likely to develop this condition: Age. Risk increases as you get older. Having a condition that affects the heart or the central nervous system. What are the signs or symptoms? Common symptoms of this condition include: Weakness. Light-headedness. Dizziness. Blurred vision. Tiredness (fatigue). Rapid heartbeat. Fainting, in severe cases. How is this diagnosed? This condition is diagnosed based on: Your medical history. Your symptoms. Your blood pressure measurement. Your health care provider will check your blood pressure when you are: Lying down. Sitting. Standing. A blood pressure reading is recorded as two numbers, such as "120 over 80" (or 120/80). The first ("top") number is called the systolic pressure. It is a measure of the pressure in your arteries as your heart beats. The second  ("bottom") number is called the diastolic pressure. It is a measure of the pressure in your arteries when your heart relaxes between beats. Blood pressure is measured in a unit called mm Hg. Healthy blood pressure for most adults is 120/80. If your blood pressure is below 90/60, you may be diagnosed with hypotension. Other information or tests that may be used to diagnose hypotension include: Your other vital signs, such as your heart rate and temperature. Blood tests. Tilt table test. For this test, you will be safely secured to a table that moves you from a lying position to an upright position. Your heart rhythm and blood pressure will be monitored during the test. How is this treated? Treatment for this condition may include: Changing your diet. This may involve drinking more water or increasing your salt (sodium) intake with high-sodium foods. Taking medicines to raise your blood pressure. Changing the dosage of certain medicines you are taking that might be lowering your blood pressure. Wearing compression stockings. These stockings help to prevent blood clots and reduce swelling in your legs. In some cases, you may need to go to the hospital for: Fluid replacement. This means you will receive fluids through an IV. Blood replacement. This means you will receive donated blood through an IV (transfusion). Treating an infection or heart problems, if this applies. Monitoring. You may need to be monitored while medicines that you are taking wear off. Follow these instructions at home: Eating and drinking  Drink enough fluid to keep your urine pale yellow. Eat a healthy diet, and follow instructions from your health care provider about eating or drinking restrictions. A healthy diet includes: Fresh fruits and vegetables. Whole grains. Lean meats. Low-fat dairy products. Increase your salt intake if told  to do so. Do not add extra salt to your diet unless your health care provider tells you  to do that. Eat frequent, small meals. Avoid standing up suddenly after eating. Medicines Take over-the-counter and prescription medicines only as told by your health care provider. Follow instructions from your health care provider about changing the dosage of your current medicines, if this applies. Do not stop or adjust any of your medicines on your own. General instructions  Wear compression stockings as told by your health care provider. Get up slowly from lying down or sitting positions. This gives your blood pressure a chance to adjust. Avoid hot showers and excessive heat as directed by your health care provider. Return to your normal activities as told by your health care provider. Ask your health care provider what activities are safe for you. Do not use any products that contain nicotine or tobacco. These products include cigarettes, chewing tobacco, and vaping devices, such as e-cigarettes. If you need help quitting, ask your health care provider. Keep all follow-up visits. This is important. Contact a health care provider if: You vomit. You have diarrhea. You have a fever for more than 2-3 days. You feel more thirsty than usual. You feel weak and tired. Get help right away if: You have chest pain. You have a fast or irregular heartbeat. You develop numbness in any part of your body. You cannot move your arms or your legs. You have trouble speaking. You become sweaty or feel light-headed. You faint. You feel short of breath. You have trouble staying awake. You feel confused. These symptoms may be an emergency. Get help right away. Call 911. Do not wait to see if the symptoms will go away. Do not drive yourself to the hospital. Summary Hypotension is when the force of blood pumping through the arteries is too weak. Hypotension may be harmless (benign) or may cause serious problems (be critical). Treatment for this condition may include changing your diet, changing  your medicines, and wearing compression stockings. In some cases, you may need to go to the hospital for fluid or blood replacement. This information is not intended to replace advice given to you by your health care provider. Make sure you discuss any questions you have with your health care provider. Document Revised: 02/25/2021 Document Reviewed: 02/25/2021 Elsevier Patient Education  2024 [employer].

## 2024-01-01 NOTE — Progress Notes (Unsigned)
 Established Patient Office Visit  Subjective   Patient ID: Ana King, female    DOB: 02-15-1951  Age: 73 y.o. MRN: [id number]  Chief Complaint  Patient presents with   Medical Management of Chronic Issues    HPI Pt is a 73 yo female who presents to the clinic to follow up after fall and discuss some concerns.   Pt was getting out of bed and stood up and felt lightheaded and fell backward into bedpost. She did not lose consciousness. She took her BP was was low 100s over 70s. She ate and drank and felt better and has not happened since. Denies any fever, chills, URI symptoms, SOB. She continues to check BP and staying in the 130s over 60-70s.   Pt is concerned with her COPD and upcoming rotator cuff surgery(6/18) about being put to sleep and will her lungs be ok. She is not having any trouble breathing, cough, CP or issues right now.   ROS See HPI.    Objective:     BP (!) 131/52   Pulse (!) 55   Ht 5' (1.524 m)   Wt 195 lb (88.5 kg)   SpO2 99%   BMI 38.08 kg/m  BP Readings from Last 3 Encounters:  01/01/24 (!) 131/52  12/11/23 115/72  10/30/23 (!) 147/85   Wt Readings from Last 3 Encounters:  01/01/24 195 lb (88.5 kg)  12/11/23 195 lb (88.5 kg)  10/30/23 197 lb (89.4 kg)     Physical Exam Constitutional:      Appearance: Normal appearance. She is obese.  HENT:     Head: Normocephalic.     Nose: Nose normal.     Mouth/Throat:     Mouth: Mucous membranes are moist.   Eyes:     Conjunctiva/sclera: Conjunctivae normal.    Cardiovascular:     Rate and Rhythm: Normal rate and regular rhythm.     Pulses: Normal pulses.     Heart sounds: Normal heart sounds.  Pulmonary:     Effort: Pulmonary effort is normal.     Breath sounds: Normal breath sounds.   Musculoskeletal:     Cervical back: Normal range of motion and neck supple. No tenderness.     Right lower leg: No edema.     Left lower leg: No edema.  Lymphadenopathy:     Cervical: No cervical  adenopathy.   Neurological:     General: No focal deficit present.     Mental Status: She is alert and oriented to person, place, and time.   Psychiatric:        Mood and Affect: Mood normal.      The 10-year ASCVD risk score (Arnett DK, et al., 2019) is: 13.4%    Assessment & Plan:  Aaron AasAaron AasNiko was seen today for medical management of chronic issues.  Diagnoses and all orders for this visit:  Fall, initial encounter  Hypotension, unspecified hypotension type  Essential hypertension, benign -     lisinopril  (ZESTRIL ) 10 MG tablet; Take 1 tablet (10 mg total) by mouth daily.  Chronic obstructive pulmonary disease, unspecified COPD type (HCC)   Orthostatic Vitals in chart show no significant changes from positions This could have been a situational cause make sure drinking enough water daily and be slow to change positions to allow your body time to adjust to changes Continue to monitor BP Stay on lisionpril 10mg  daily Lungs sound good today, no concerns with upcoming rotator cuff surgery. Discussed with surgeon but a lot  of time they use regional anesthesia and should have no impact of breathing.    Kelvin Burpee, PA-C

## 2024-01-04 ENCOUNTER — Encounter: Payer: Self-pay | Admitting: Physician Assistant

## 2024-01-06 DIAGNOSIS — S46211A Strain of muscle, fascia and tendon of other parts of biceps, right arm, initial encounter: Secondary | ICD-10-CM | POA: Insufficient documentation

## 2024-01-06 DIAGNOSIS — Z4889 Encounter for other specified surgical aftercare: Secondary | ICD-10-CM | POA: Diagnosis not present

## 2024-01-06 DIAGNOSIS — J449 Chronic obstructive pulmonary disease, unspecified: Secondary | ICD-10-CM | POA: Diagnosis not present

## 2024-01-06 DIAGNOSIS — M7521 Bicipital tendinitis, right shoulder: Secondary | ICD-10-CM | POA: Diagnosis not present

## 2024-01-06 DIAGNOSIS — N182 Chronic kidney disease, stage 2 (mild): Secondary | ICD-10-CM | POA: Diagnosis not present

## 2024-01-06 DIAGNOSIS — Z87891 Personal history of nicotine dependence: Secondary | ICD-10-CM | POA: Diagnosis not present

## 2024-01-06 DIAGNOSIS — I129 Hypertensive chronic kidney disease with stage 1 through stage 4 chronic kidney disease, or unspecified chronic kidney disease: Secondary | ICD-10-CM | POA: Diagnosis not present

## 2024-01-06 DIAGNOSIS — M19011 Primary osteoarthritis, right shoulder: Secondary | ICD-10-CM | POA: Diagnosis not present

## 2024-01-06 DIAGNOSIS — S43431A Superior glenoid labrum lesion of right shoulder, initial encounter: Secondary | ICD-10-CM | POA: Diagnosis not present

## 2024-01-06 DIAGNOSIS — M75121 Complete rotator cuff tear or rupture of right shoulder, not specified as traumatic: Secondary | ICD-10-CM | POA: Diagnosis not present

## 2024-01-06 DIAGNOSIS — E66811 Obesity, class 1: Secondary | ICD-10-CM | POA: Diagnosis not present

## 2024-01-06 DIAGNOSIS — G8918 Other acute postprocedural pain: Secondary | ICD-10-CM | POA: Diagnosis not present

## 2024-01-06 DIAGNOSIS — Z6834 Body mass index (BMI) 34.0-34.9, adult: Secondary | ICD-10-CM | POA: Diagnosis not present

## 2024-01-11 NOTE — Therapy (Unsigned)
 OUTPATIENT PHYSICAL THERAPY SHOULDER EVALUATION   Patient Name: Ana King MRN: [id number] DOB:27-Apr-1951, 73 y.o., female Today's Date: 01/12/2024  END OF SESSION:  PT End of Session - 01/12/24 1139     Visit Number 1    Number of Visits 20    Date for PT Re-Evaluation 03/22/24    Authorization Type human[employer] prior auth required copay $25    PT Start Time 1100    PT Stop Time 1140    PT Time Calculation (min) 40 min    Activity Tolerance Patient tolerated treatment well          Past Medical History:  Diagnosis Date   Asthma    Essential hypertension, benign 08/28/2015   GERD (gastroesophageal reflux disease)    Past Surgical History:  Procedure Laterality Date   ABDOMINAL HYSTERECTOMY     Patient Active Problem List   Diagnosis Date Noted   Fall 01/01/2024   Hypotension 09/23/2023   Lung consolidation (HCC) 09/23/2023   Abnormal CT lung screening 09/23/2023   Hot flashes 09/23/2023   Opacity of lung on imaging study 06/29/2023   Pulmonary nodule 06/29/2023   Rotator cuff tear, right 04/22/2023   Excessive daytime sleepiness 03/17/2023   Fatigue 03/17/2023   GAD (generalized anxiety disorder) 03/10/2023   Anxiety attack 03/10/2023   Atypical chest pain 02/10/2023   Chronic obstructive pulmonary disease (HCC) 02/10/2023   Mild intermittent asthma with exacerbation 02/04/2023   Pain of great toe, left 12/12/2022   Gout 09/05/2022   Great toe pain, right 09/03/2022   Pruritus 08/05/2022   Chronic allergic rhinitis 07/08/2022   Migraine without aura and without status migrainosus, not intractable 03/28/2022   Seasonal allergic rhinitis due to pollen 12/24/2021   Seasonal allergies 12/24/2021   Decreased GFR 12/24/2021   Injury of right leg 10/14/2021   Bilateral leg edema 07/30/2021   Left ear impacted cerumen 07/30/2021   Vertigo 07/30/2021   Coronary artery calcification seen on CT scan 04/17/2021   Aortic atherosclerosis (HCC) 04/17/2021    Centrilobular emphysema (HCC) 04/17/2021   Grade I diastolic dysfunction 02/26/2021   CKD (chronic kidney disease) stage 3, GFR 30-59 ml/min (HCC) 02/20/2021   AKI (acute kidney injury) (HCC) 02/21/2020   Hyponatremia 02/20/2020   Hypocalcemia 02/20/2020   Low hemoglobin 02/20/2020   Herpes zoster with ophthalmic complication 02/03/2020   Acute intractable headache 01/31/2020   Vision changes 01/31/2020   Non-intractable vomiting 01/31/2020   Eye pain, right 01/31/2020   Plantar fasciitis, left 01/24/2020   Diarrhea 11/08/2019   OSA (obstructive sleep apnea) 09/20/2019   Hyperlipidemia 08/18/2019   Non-restorative sleep 08/17/2019   Snoring 08/17/2019   Class 2 obesity due to excess calories without serious comorbidity with body mass index (BMI) of 36.0 to 36.9 in adult 08/17/2019   Cervical spondylosis 05/10/2019   SOB (shortness of breath) on exertion 01/11/2019   Depressed mood 01/11/2019   Osteopenia 03/17/2018   Chronic venous stasis 12/22/2017   Edema of left ankle 12/22/2017   Reactive airway disease 06/09/2017   Former smoker 06/09/2017   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   Contusion 11/13/2015   Essential hypertension, benign 08/28/2015   Hiatal hernia 12/09/2013   Anxiety state 11/02/2013   GERD (gastroesophageal reflux disease) 11/02/2013    PCP: Vermell LITTIE Bologna, PA-C  REFERRING PROVIDER: Dr Elspeth CHRISTELLA Farrow   REFERRING DIAG: R shoulder scope with RCR, SAD, DCR  THERAPY DIAG:  Acute pain of right shoulder  Abnormal posture  Other symptoms and signs  involving the musculoskeletal system  Muscle weakness (generalized)  Rationale for Evaluation and Treatment: Rehabilitation  ONSET DATE: 01/06/24  SUBJECTIVE:                                                                                                                                                                                      SUBJECTIVE STATEMENT: Patient has a history of R rotator cuff tear with no  known injury. Symptoms have been present since ~ 02/02/23. Patient underwent R arthroscopic RCR, DCR, SAD 01/06/24. She presents in abduction sling R UE. She has done okay since surgery. She is sleeping in her recliner and is very sedentary at this time. She has an ice machine that does some compression for home and that has helped with the pain. She sleeps with the ice machine and that helps a lot.  Hand dominance: Right  PERTINENT HISTORY: HTN; asthma; COPD; allergies; anxiety; headaches   PAIN:  Are you having pain? Yes: NPRS scale: 5 Pain location: mid arm(deltoid area); minimal to no pain in the shoulder joint  Pain description: muscle soreness  Aggravating factors: moving in her sleep; trying the pendulum  Relieving factors: ice machine; pain meds  PRECAUTIONS: Shoulder post op per protocol   RED FLAGS: None   WEIGHT BEARING RESTRICTIONS: Yes   FALLS:  Has patient fallen in last 6 months? Yes. 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  DIAGNOSTIC FINDINGS:  X-ray R shoulder: 04/06/24: FINDINGS: The bones are subjectively under mineralized. There is no evidence of fracture or dislocation. The alignment is normal. The joint spaces are normal. There is no evidence of arthropathy or other focal bone abnormality. Soft tissues are unremarkable.   IMPRESSION: Subjective osteopenia/osteoporosis. Otherwise negative radiographs of the right shoulder  PATIENT SURVEYS:  Quick DASH: 84.1/100; 84.1%    COGNITION: Overall cognitive status: Within functional limits for tasks assessed     SENSATION: WFL  POSTURE: Patient presents with head forward posture with increased thoracic kyphosis; shoulders rounded and elevated   UPPER EXTREMITY ROM: R shoulder deferred due to surgery   Active/Passive ROM Right eval Left Eval AROM  Shoulder flexion  132  Shoulder extension  47  Shoulder abduction  124  Shoulder adduction    Shoulder internal rotation  T10  Shoulder external rotation  49  Elbow flexion 100   Elbow extension -40   Wrist flexion WFL's   Wrist extension WFL's   Wrist ulnar deviation    Wrist radial deviation    Forearm pronation WFL's    Forearm supination WFL's   (Blank rows = not tested)  UPPER EXTREMITY MMT: deferred due to surgery    MMT Right eval Left eval  Shoulder flexion    Shoulder extension    Shoulder abduction    Shoulder adduction    Shoulder internal rotation    Shoulder external rotation    Middle trapezius    Lower trapezius    Elbow flexion    Elbow extension    Wrist flexion    Wrist extension    Wrist ulnar deviation    Wrist radial deviation    Wrist pronation    Wrist supination    Grip strength (lbs)    (Blank rows = not tested)  PALPATION:  Tightness R shoulder girdle                                                                                                                              TREATMENT DATE: 01/02/24 Review of evaluation and POC Initial HEP Post op precautions    PATIENT EDUCATION: Education details: POC; HEP Person educated: Patient Education method: Programmer, multimedia, Demonstration, Tactile cues, Verbal cues, and Handouts Education comprehension: verbalized understanding, returned demonstration, verbal cues required, tactile cues required, and needs further education  HOME EXERCISE PROGRAM: Access Code: [email or web address] URL: [email or web address] Date: 01/12/2024 Prepared by: Deionna Marcantonio  Exercises - Seated Straight Fist AROM  - 2 x daily - 7 x weekly - 1 sets - 5 reps -   hold - Wrist AROM Flexion Extension  - 2 x daily - 7 x weekly - 1 sets - 5 reps - Wrist Circumduction AROM  - 2 x daily - 7 x weekly - 1 sets - 5-10 reps - Seated Forearm Pronation and Supination AROM  - 2 x daily - 7 x weekly - 1 sets - 5 reps - Seated Elbow Flexion and Extension AROM  - 2 x daily - 7 x weekly - 1 sets - 5 reps - Seated Scapular Retraction  - 2 x daily - 7 x weekly - 1-2 sets - 10 reps - 10 sec  hold  ASSESSMENT:  CLINICAL IMPRESSION: Patient is a 73 y.o. female who was seen today for physical therapy evaluation and treatment  s/p R RCR, SAD, DCR 01/06/24 following nontraumatic complete tear of R RC. She presents decreased ROM and strength R UE, with R shoulder pain and decreased function. She has poor posture and alignment; limited cervical and shoulder ROM, mobility, strength, function. She will benefit from PT to address problems identified.   OBJECTIVE IMPAIRMENTS: decreased activity tolerance, decreased mobility, decreased ROM, decreased strength,  increased fascial restrictions, increased muscle spasms, improper body mechanics, postural dysfunction, obesity, and pain.   ACTIVITY LIMITATIONS: carrying, lifting, sleeping, bathing, toileting, and dressing  PARTICIPATION LIMITATIONS: meal prep, cleaning, laundry, driving, and shopping  PERSONAL FACTORS: Age, Behavior pattern, Education, Fitness, Past/current experiences, and Time since onset of injury/illness/exacerbation are also affecting patient's functional outcome.   REHAB POTENTIAL: Good  CLINICAL DECISION MAKING: Evolving/moderate complexity  EVALUATION COMPLEXITY: Moderate   GOALS: Goals reviewed with patient? Yes  SHORT TERM GOALS: Target date: 02/16/2024   Independent in initial HEP Baseline: Goal status: INITIAL  2.  Progress with PROM R shoulder per protocol  Baseline:  Goal status: INITIAL  3.  Initiate  AROM exercises per protocol  Baseline:  Goal status: INITIAL   LONG TERM GOALS: Target date: 03/22/2024   Increase AROM R shoulder to WFL's as indicated per protocol  Baseline:  Goal status: INITIAL  2.  4/5 to 5/5 strength R shoulder  Baseline:  Goal status: INITIAL  3.  Patient reports use of R UE for functional activities  Baseline:  Goal status: INITIAL  4.  Patient reports ability to sleep without awakening due to R shoulder pain  Baseline:  Goal status: INITIAL  5.  Independent in advanced HEP  Baseline:  Goal status: INITIAL  6.  Improve Quick DASH by 25-30%  Baseline: 84.1/100; 84.1% Goal status: INITIAL  PLAN:  PT FREQUENCY: 1-2x/week  PT DURATION: 10 weeks  PLANNED INTERVENTIONS: 97164- PT Re-evaluation, 97110-Therapeutic exercises, 97530- Therapeutic activity, 97112- Neuromuscular re-education, 97535- Self Care, [id number]- Manual therapy, 97016- Vasopneumatic device, and Patient/Family education  PLAN FOR NEXT SESSION: shoulder rehab per MD protocol    Florene SHAUNNA Baptist, PT 01/12/2024, 11:41 AM   Referring diagnosis? M25.511 Treatment diagnosis? (if different than referring diagnosis) M25.111; R29.3; R29.898; M62.81 What was this (referring dx) caused by? [x]  Surgery []  Fall []  Ongoing issue []  Arthritis []  Other: ____________  Laterality: [x]  Rt []  Lt []  Both  Check all possible CPT codes:  *CHOOSE 10 OR LESS*    See Planned Interventions listed in the Plan section of the Evaluation.

## 2024-01-12 ENCOUNTER — Encounter: Payer: Self-pay | Admitting: Rehabilitative and Restorative Service Providers"

## 2024-01-12 ENCOUNTER — Ambulatory Visit: Attending: Orthopaedic Surgery | Admitting: Rehabilitative and Restorative Service Providers"

## 2024-01-12 ENCOUNTER — Other Ambulatory Visit: Payer: Self-pay

## 2024-01-12 DIAGNOSIS — R293 Abnormal posture: Secondary | ICD-10-CM | POA: Insufficient documentation

## 2024-01-12 DIAGNOSIS — M25511 Pain in right shoulder: Secondary | ICD-10-CM | POA: Diagnosis not present

## 2024-01-12 DIAGNOSIS — M6281 Muscle weakness (generalized): Secondary | ICD-10-CM | POA: Insufficient documentation

## 2024-01-12 DIAGNOSIS — R29898 Other symptoms and signs involving the musculoskeletal system: Secondary | ICD-10-CM | POA: Insufficient documentation

## 2024-01-13 ENCOUNTER — Telehealth: Payer: Self-pay | Admitting: Acute Care

## 2024-01-13 DIAGNOSIS — Z87891 Personal history of nicotine dependence: Secondary | ICD-10-CM

## 2024-01-13 DIAGNOSIS — R911 Solitary pulmonary nodule: Secondary | ICD-10-CM

## 2024-01-13 NOTE — Telephone Encounter (Signed)
 Spoke with patient by phone to review results of LDCT.  Nodule of previous interest has resolved.  New area recommending follow up.  Patient has reviewed results in mychart.  She denies any new symptoms of illness, states she does not feel any congestion in chest and does not cough up any phlegm. No increased shortness of breath or anything unusual.  She is in agreement to repeat the LDCT in 8 weeks per recommendation.  New order placed and results/plan faxed to PCP.

## 2024-01-13 NOTE — Telephone Encounter (Signed)
 Results reviewed by Lauraine Lites, NP with recommendation to repeat LDCT in 8 weeks.  Likely infection, since there is a new area of consolidative changes measuring 18.68mm.  The previous nodule of interest has resolved.  Please ask patient about any ongoing or new symptoms of illness or increased congestion in chest.  If symptoms present, please have her follow up with PCP or urgent care for treatment.  Atherosclerosis and emphysema again noted.  Please fax resuts/plan to PCP and place order for 8 weeks follow up LDCT.

## 2024-01-13 NOTE — Addendum Note (Signed)
 Addended by: DIONISIO AQUAS on: 01/13/2024 04:18 PM   Modules accepted: Orders

## 2024-01-13 NOTE — Telephone Encounter (Signed)
 Call report: Previous 4A  IMPRESSION: 1. Lung-RADS 0s, incomplete. Additional lung cancer screening CT images/or comparison to prior chest CT examinations is needed. 2. S modifier refers to the new area of subpleural consolidative change within the posterior left upper lobe abutting the oblique fissure has a mean derived diameter of 18.4 mm. This likely reflects an area of focal infection or inflammation. Follow-up imaging with repeat LDCT in 1-3 months following appropriate antibiotic therapy is advised to ensure resolution. 3. Interval resolution of previously noted Lung-RADS 4a right upper lobe lung nodule. 4. Coronary artery calcifications. 5. Aortic Atherosclerosis (ICD10-I70.0) and Emphysema (ICD10-J43.9).   These results will be called to the ordering clinician or representative by the Radiologist Assistant, and communication documented in the PACS or [employer].     Electronically Signed   By: Waddell Calk M.D.   On: 01/13/2024 04:54

## 2024-01-21 ENCOUNTER — Encounter: Admitting: Rehabilitative and Restorative Service Providers"

## 2024-01-26 ENCOUNTER — Encounter: Payer: Self-pay | Admitting: Physician Assistant

## 2024-01-27 ENCOUNTER — Ambulatory Visit (INDEPENDENT_AMBULATORY_CARE_PROVIDER_SITE_OTHER): Admitting: Physician Assistant

## 2024-01-27 ENCOUNTER — Encounter: Payer: Self-pay | Admitting: Physician Assistant

## 2024-01-27 ENCOUNTER — Encounter: Payer: Self-pay | Admitting: Rehabilitative and Restorative Service Providers"

## 2024-01-27 ENCOUNTER — Ambulatory Visit: Attending: Orthopaedic Surgery | Admitting: Rehabilitative and Restorative Service Providers"

## 2024-01-27 VITALS — BP 122/80 | HR 68 | Temp 98.4°F | Ht 63.0 in | Wt 202.0 lb

## 2024-01-27 DIAGNOSIS — R2689 Other abnormalities of gait and mobility: Secondary | ICD-10-CM | POA: Insufficient documentation

## 2024-01-27 DIAGNOSIS — M6281 Muscle weakness (generalized): Secondary | ICD-10-CM | POA: Diagnosis not present

## 2024-01-27 DIAGNOSIS — R29898 Other symptoms and signs involving the musculoskeletal system: Secondary | ICD-10-CM | POA: Insufficient documentation

## 2024-01-27 DIAGNOSIS — R293 Abnormal posture: Secondary | ICD-10-CM | POA: Diagnosis not present

## 2024-01-27 DIAGNOSIS — R600 Localized edema: Secondary | ICD-10-CM | POA: Diagnosis not present

## 2024-01-27 DIAGNOSIS — M25511 Pain in right shoulder: Secondary | ICD-10-CM | POA: Diagnosis not present

## 2024-01-27 MED ORDER — FUROSEMIDE 20 MG PO TABS: ORAL_TABLET | ORAL | 0 refills | Status: DC

## 2024-01-27 NOTE — Progress Notes (Signed)
   Acute Office Visit  Subjective:     Patient ID: Ana King, female    DOB: 14-Oct-1950, 73 y.o.   MRN: [id number]  Chief Complaint  Patient presents with   Leg Swelling    Bilateral ankle swelling onset 1 week, started in left ankle then the right  from the shin down to the ankle even with elevation, pt states she can't put her shoes on.    HPI Patient is in today for bilateral swelling of feet and ankles. Swelling started in left and then moved to right. She had surgery 6/18 on her shoulder. She went on a trip to [place] about 1 week ago. Swelling seemed to get worse on trip where it was painful. Swelling does get better overnight and worse during the day. Pt denies any fever, chills, SOB, cough. She has not done anything to make better. She has been on HcTZ in the past but had side effects. She is not wearing compression stockings. She is trying to keep feet elevated.   Review of Systems  All other systems reviewed and are negative.       Objective:    BP 122/80   Pulse 68   Temp 98.4 F (36.9 C) (Oral)   Ht 5' 3 (1.6 m)   Wt 202 lb (91.6 kg)   SpO2 100%   BMI 35.78 kg/m  BP Readings from Last 3 Encounters:  01/27/24 122/80  01/01/24 (!) 135/53  12/11/23 115/72   Wt Readings from Last 3 Encounters:  01/27/24 202 lb (91.6 kg)  01/01/24 194 lb (88 kg)  12/11/23 195 lb (88.5 kg)      Physical Exam Constitutional:      Appearance: Normal appearance. She is obese.  Cardiovascular:     Rate and Rhythm: Normal rate.  Pulmonary:     Effort: Pulmonary effort is normal.     Breath sounds: Normal breath sounds.  Musculoskeletal:     Right lower leg: Edema present.     Left lower leg: Edema present.     Comments: Right: 1/2 pitting edema Left: 2+ pitting edema No warmth or redness to skin but somewhat tender to touch  Neurological:     General: No focal deficit present.     Mental Status: She is alert and oriented to person, place, and time.  Psychiatric:         Mood and Affect: Mood normal.          Assessment & Plan:  Ana King was seen today for leg swelling.  Diagnoses and all orders for this visit:  Bilateral leg edema -     furosemide  (LASIX ) 20 MG tablet; Take one tablet daily for the next 5 days then as needed for lower extremity swelling.  Suspect swelling is more venous insuffiencey after surgery and travel Discussed wearing compression stockings and continue to keep feet elevated Make sure not sitting all day long take walking trips in the house Start lasix  for 5 days then as needed for lower extremity swelling Follow up as needed if symptoms persist or worsen.    Steele Stracener, PA-C

## 2024-01-27 NOTE — Therapy (Signed)
 OUTPATIENT PHYSICAL THERAPY SHOULDER TREATMENT    Patient Name: Ana King MRN: [id number] DOB:1951-02-16, 73 y.o., female Today's Date: 01/27/2024  END OF SESSION:  PT End of Session - 01/27/24 0936     Visit Number 2    Number of Visits 20    Date for PT Re-Evaluation 03/22/24    Authorization Type human[employer] prior auth required copay $25    PT Start Time 0930    PT Stop Time 1015    PT Time Calculation (min) 45 min    Activity Tolerance Patient tolerated treatment well          Past Medical History:  Diagnosis Date   Asthma    Essential hypertension, benign 08/28/2015   GERD (gastroesophageal reflux disease)    Past Surgical History:  Procedure Laterality Date   ABDOMINAL HYSTERECTOMY     Patient Active Problem List   Diagnosis Date Noted   Fall 01/01/2024   Hypotension 09/23/2023   Lung consolidation (HCC) 09/23/2023   Abnormal CT lung screening 09/23/2023   Hot flashes 09/23/2023   Opacity of lung on imaging study 06/29/2023   Pulmonary nodule 06/29/2023   Rotator cuff tear, right 04/22/2023   Excessive daytime sleepiness 03/17/2023   Fatigue 03/17/2023   GAD (generalized anxiety disorder) 03/10/2023   Anxiety attack 03/10/2023   Atypical chest pain 02/10/2023   Chronic obstructive pulmonary disease (HCC) 02/10/2023   Mild intermittent asthma with exacerbation 02/04/2023   Pain of great toe, left 12/12/2022   Gout 09/05/2022   Great toe pain, right 09/03/2022   Pruritus 08/05/2022   Chronic allergic rhinitis 07/08/2022   Migraine without aura and without status migrainosus, not intractable 03/28/2022   Seasonal allergic rhinitis due to pollen 12/24/2021   Seasonal allergies 12/24/2021   Decreased GFR 12/24/2021   Injury of right leg 10/14/2021   Bilateral leg edema 07/30/2021   Left ear impacted cerumen 07/30/2021   Vertigo 07/30/2021   Coronary artery calcification seen on CT scan 04/17/2021   Aortic atherosclerosis (HCC) 04/17/2021   Centrilobular  emphysema (HCC) 04/17/2021   Grade I diastolic dysfunction 02/26/2021   CKD (chronic kidney disease) stage 3, GFR 30-59 ml/min (HCC) 02/20/2021   AKI (acute kidney injury) (HCC) 02/21/2020   Hyponatremia 02/20/2020   Hypocalcemia 02/20/2020   Low hemoglobin 02/20/2020   Herpes zoster with ophthalmic complication 02/03/2020   Acute intractable headache 01/31/2020   Vision changes 01/31/2020   Non-intractable vomiting 01/31/2020   Eye pain, right 01/31/2020   Plantar fasciitis, left 01/24/2020   Diarrhea 11/08/2019   OSA (obstructive sleep apnea) 09/20/2019   Hyperlipidemia 08/18/2019   Non-restorative sleep 08/17/2019   Snoring 08/17/2019   Class 2 obesity due to excess calories without serious comorbidity with body mass index (BMI) of 36.0 to 36.9 in adult 08/17/2019   Cervical spondylosis 05/10/2019   SOB (shortness of breath) on exertion 01/11/2019   Depressed mood 01/11/2019   Osteopenia 03/17/2018   Chronic venous stasis 12/22/2017   Edema of left ankle 12/22/2017   Reactive airway disease 06/09/2017   Former smoker 06/09/2017   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   Contusion 11/13/2015   Essential hypertension, benign 08/28/2015   Hiatal hernia 12/09/2013   Anxiety state 11/02/2013   GERD (gastroesophageal reflux disease) 11/02/2013    PCP: Vermell LITTIE Bologna, PA-C  REFERRING PROVIDER: Dr Elspeth CHRISTELLA Farrow   REFERRING DIAG: R shoulder scope with RCR, SAD, DCR  THERAPY DIAG:  Acute pain of right shoulder  Abnormal posture  Other symptoms and  signs involving the musculoskeletal system  Muscle weakness (generalized)  Other abnormalities of gait and mobility  Rationale for Evaluation and Treatment: Rehabilitation  ONSET DATE: 01/06/24  SUBJECTIVE:                                                                                                                                                                                      SUBJECTIVE STATEMENT: Patient reports that  she continued pain in the R shoulder. She has been doing her exercises at home. She is still sleeping in recliner couch.   EVAL: Patient has a history of R rotator cuff tear with no known injury. Symptoms have been present since ~ 02/02/23. Patient underwent R arthroscopic RCR, DCR, SAD 01/06/24. She presents in abduction sling R UE. She has done okay since surgery. She is sleeping in her recliner and is very sedentary at this time. She has an ice machine that does some compression for home and that has helped with the pain. She sleeps with the ice machine and that helps a lot.  Hand dominance: Right  PERTINENT HISTORY: HTN; asthma; COPD; allergies; anxiety; headaches   PAIN:  Are you having pain? Yes: NPRS scale: 5 Pain location: mid arm(deltoid area); minimal to no pain in the shoulder joint  Pain description: muscle soreness  Aggravating factors: moving in her sleep; trying the pendulum  Relieving factors: ice machine; pain meds  PRECAUTIONS: Shoulder post op per protocol     WEIGHT BEARING RESTRICTIONS: Yes   FALLS:  Has patient fallen in last 6 months? Yes. 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  DIAGNOSTIC FINDINGS:  X-ray R shoulder: 04/06/24: FINDINGS: The bones are subjectively under mineralized. There is no evidence of fracture or dislocation. The alignment is normal. The joint spaces are normal. There is no evidence of arthropathy or other focal bone abnormality. Soft tissues are unremarkable.    IMPRESSION: Subjective osteopenia/osteoporosis. Otherwise negative radiographs of the right shoulder  PATIENT SURVEYS:  Quick DASH: 84.1/100; 84.1%      SENSATION: WFL  POSTURE: Patient presents with head forward posture with increased thoracic kyphosis; shoulders rounded and elevated   UPPER EXTREMITY ROM: R shoulder deferred due to surgery  Active/Passive ROM Right eval Left Eval AROM  Shoulder flexion  132  Shoulder extension  47  Shoulder abduction  124  Shoulder adduction    Shoulder internal rotation  T10  Shoulder external rotation  49  Elbow flexion 100   Elbow extension -40   Wrist flexion WFL's   Wrist extension WFL's   Wrist ulnar deviation    Wrist radial deviation    Forearm pronation WFL's    Forearm supination WFL's   (Blank rows = not tested)  UPPER EXTREMITY MMT: deferred due to surgery    MMT Right eval Left eval  Shoulder flexion    Shoulder extension    Shoulder abduction    Shoulder adduction    Shoulder internal rotation    Shoulder external rotation    Middle trapezius    Lower trapezius    Elbow flexion    Elbow extension    Wrist flexion    Wrist extension    Wrist ulnar deviation    Wrist radial deviation    Wrist pronation    Wrist supination    Grip strength (lbs)    (Blank rows = not tested)  PALPATION:  Tightness R shoulder girdle                                                                                                                              [facility] Adult PT Treatment:                                                DATE: 01/27/24 Therapeutic Exercise: Standing  Pendulum 30 CW/30 CCW Sitting  Fist open and close Wrist flexion/extension Wrist circumduction  Supination/pronation  Elbow flexion/extension to tolerance  Manual Therapy: STM R shoulder girdle patient sitting PROM R shoulder  Neuromuscular re-ed: Scap squeeze with coregeous ball thoracic spine 5 sec x 10  Therapeutic  Activity:  Modalities: Vaso  Self Care: Reviewed protocol limitations Use of ice    TREATMENT DATE: 01/02/24 Review of evaluation and POC Initial HEP Post op precautions    PATIENT EDUCATION: Education details: POC; HEP Person educated: Patient Education method: Programmer, multimedia, Demonstration, Tactile cues, Verbal cues, and Handouts Education comprehension: verbalized understanding, returned demonstration, verbal cues required, tactile cues required, and needs further education  HOME EXERCISE PROGRAM: Access Code: [email or web address] URL: [email or web address] Date: 01/12/2024 Prepared by: Temitope Griffing  Exercises - Seated Straight Fist AROM  - 2 x daily - 7 x weekly - 1 sets - 5 reps -   hold - Wrist AROM Flexion Extension  - 2 x daily - 7 x weekly - 1 sets - 5 reps - Wrist Circumduction AROM  - 2 x daily - 7 x weekly - 1 sets - 5-10 reps - Seated Forearm Pronation and Supination AROM  - 2 x daily - 7  x weekly - 1 sets - 5 reps - Seated Elbow Flexion and Extension AROM  - 2 x daily - 7 x weekly - 1 sets - 5 reps - Seated Scapular Retraction  - 2 x daily - 7 x weekly - 1-2 sets - 10 reps - 10 sec  hold  ASSESSMENT:  CLINICAL IMPRESSION: Patient continues to have pain in the R shoulder. She reports compliance with HEP and has continued to use abduction sling at home. Continued with wrist, hand ROM exercises. Manual work through the R shoulder girdle with PROM by PT within protocol parameters and tissue limitations. Range ~ 90 deg flexion; 60 deg abduction in scapular plane; ER 40 deg in scapular plane. Tolerated all range will    Eval: Patient is a 73 y.o. female who was seen today for physical therapy evaluation and treatment  s/p R RCR, SAD, DCR 01/06/24 following nontraumatic complete tear of R RC. She presents decreased ROM and strength R UE, with R shoulder pain and decreased function. She has poor posture and alignment; limited cervical and shoulder ROM, mobility, strength,  function. She will benefit from PT to address problems identified.   OBJECTIVE IMPAIRMENTS: decreased activity tolerance, decreased mobility, decreased ROM, decreased strength, increased fascial restrictions, increased muscle spasms, improper body mechanics, postural dysfunction, obesity, and pain.   GOALS: Goals reviewed with patient? Yes  SHORT TERM GOALS: Target date: 02/16/2024   Independent in initial HEP Baseline: Goal status: INITIAL  2.  Progress with PROM R shoulder per protocol  Baseline:  Goal status: INITIAL  3.  Initiate AROM exercises per protocol  Baseline:  Goal status: INITIAL   LONG TERM GOALS: Target date: 03/22/2024   Increase AROM R shoulder to WFL's as indicated per protocol  Baseline:  Goal status: INITIAL  2.  4/5 to 5/5 strength R shoulder  Baseline:  Goal status: INITIAL  3.  Patient reports use of R UE for functional activities  Baseline:  Goal status: INITIAL  4.  Patient reports ability to sleep without awakening due to R shoulder pain  Baseline:  Goal status: INITIAL  5.  Independent in advanced HEP  Baseline:  Goal status: INITIAL  6.  Improve Quick DASH by 25-30%  Baseline: 84.1/100; 84.1% Goal status: INITIAL  PLAN:  PT FREQUENCY: 1-2x/week  PT DURATION: 10 weeks  PLANNED INTERVENTIONS: 97164- PT Re-evaluation, 97110-Therapeutic exercises, 97530- Therapeutic activity, 97112- Neuromuscular re-education, 97535- Self Care, 02859- Manual therapy, 97016- Vasopneumatic device, and Patient/Family education  PLAN FOR NEXT SESSION: shoulder rehab per MD protocol    Florene SHAUNNA Baptist, PT 01/27/2024, 9:37 AM   Referring diagnosis? M25.511 Treatment diagnosis? (if different than referring diagnosis) M25.111; R29.3; R29.898; M62.81 What was this (referring dx) caused by? [x]  Surgery []  Fall []  Ongoing issue []  Arthritis []  Other: ____________  Laterality: [x]  Rt []  Lt []  Both  Check all possible CPT codes:  *CHOOSE 10 OR  LESS*    See Planned Interventions listed in the Plan section of the Evaluation.

## 2024-01-27 NOTE — Patient Instructions (Signed)
 Get compression socks and keep feet elevated. Start lasix  for next 5 days then as needed for swelling.   Edema  Edema is an abnormal buildup of fluids in the body tissues and under the skin. Swelling of the legs, feet, and ankles is a common symptom that becomes more likely as you get older. Swelling is also common in looser tissues, such as around the eyes. Pressing on the area may make a temporary dent in your skin (pitting edema). This fluid may also accumulate in your lungs (pulmonary edema). There are many possible causes of edema. Eating too much salt (sodium) and being on your feet or sitting for a long time can cause edema in your legs, feet, and ankles. Common causes of edema include: Certain medical conditions, such as heart failure, liver or kidney disease, and cancer. Weak leg blood vessels. An injury. Pregnancy. Medicines. Being obese. Low protein levels in the blood. Hot weather may make edema worse. Edema is usually painless. Your skin may look swollen or shiny. Follow these instructions at home: Medicines Take over-the-counter and prescription medicines only as told by your health care provider. Your health care provider may prescribe a medicine to help your body get rid of extra water (diuretic). Take this medicine if you are told to take it. Eating and drinking Eat a low-salt (low-sodium) diet to reduce fluid as told by your health care provider. Sometimes, eating less salt may reduce swelling. Depending on the cause of your swelling, you may need to limit how much fluid you drink (fluid restriction). General instructions Raise (elevate) the injured area above the level of your heart while you are sitting or lying down. Do not sit still or stand for long periods of time. Do not wear tight clothing. Do not wear garters on your upper legs. Exercise your legs to get your circulation going. This helps to move the fluid back into your blood vessels, and it may help the swelling  go down. Wear compression stockings as told by your health care provider. These stockings help to prevent blood clots and reduce swelling in your legs. It is important that these are the correct size. These stockings should be prescribed by your health care provider to prevent possible injuries. If elastic bandages or wraps are recommended, use them as told by your health care provider. Contact a health care provider if: Your edema does not get better with treatment. You have heart, liver, or kidney disease and have symptoms of edema. You have sudden and unexplained weight gain. Get help right away if: You develop shortness of breath or chest pain. You cannot breathe when you lie down. You develop pain, redness, or warmth in the swollen areas. You have heart, liver, or kidney disease and suddenly get edema. You have a fever and your symptoms suddenly get worse. These symptoms may be an emergency. Get help right away. Call 911. Do not wait to see if the symptoms will go away. Do not drive yourself to the hospital. Summary Edema is an abnormal buildup of fluids in the body tissues and under the skin. Eating too much salt (sodium)and being on your feet or sitting for a long time can cause edema in your legs, feet, and ankles. Raise (elevate) the injured area above the level of your heart while you are sitting or lying down. Follow your health care provider's instructions about diet and how much fluid you can drink. This information is not intended to replace advice given to you by your  health care provider. Make sure you discuss any questions you have with your health care provider. Document Revised: 03/11/2021 Document Reviewed: 03/11/2021 Elsevier Patient Education  2024 [employer].

## 2024-02-03 ENCOUNTER — Encounter: Payer: Self-pay | Admitting: Rehabilitative and Restorative Service Providers"

## 2024-02-03 ENCOUNTER — Ambulatory Visit: Admitting: Rehabilitative and Restorative Service Providers"

## 2024-02-03 DIAGNOSIS — M6281 Muscle weakness (generalized): Secondary | ICD-10-CM

## 2024-02-03 DIAGNOSIS — R293 Abnormal posture: Secondary | ICD-10-CM | POA: Diagnosis not present

## 2024-02-03 DIAGNOSIS — R29898 Other symptoms and signs involving the musculoskeletal system: Secondary | ICD-10-CM

## 2024-02-03 DIAGNOSIS — M25511 Pain in right shoulder: Secondary | ICD-10-CM | POA: Diagnosis not present

## 2024-02-03 DIAGNOSIS — R2689 Other abnormalities of gait and mobility: Secondary | ICD-10-CM | POA: Diagnosis not present

## 2024-02-03 NOTE — Therapy (Signed)
 OUTPATIENT PHYSICAL THERAPY SHOULDER TREATMENT    Patient Name: Ana King MRN: [id number] DOB:[birth year], 73 y.o., female Today's Date: 02/03/2024  END OF SESSION:  PT End of Session - 02/03/24 1020     Visit Number 3    Number of Visits 20    Date for PT Re-Evaluation 03/22/24    Authorization Type human[employer] prior auth required copay $25    PT Start Time 1018    PT Stop Time 1103    PT Time Calculation (min) 45 min    Activity Tolerance Patient tolerated treatment well          Past Medical History:  Diagnosis Date   Asthma    Essential hypertension, benign 08/28/2015   GERD (gastroesophageal reflux disease)    Past Surgical History:  Procedure Laterality Date   ABDOMINAL HYSTERECTOMY     Patient Active Problem List   Diagnosis Date Noted   Fall 01/01/2024   Hypotension 09/23/2023   Lung consolidation (HCC) 09/23/2023   Abnormal CT lung screening 09/23/2023   Hot flashes 09/23/2023   Opacity of lung on imaging study 06/29/2023   Pulmonary nodule 06/29/2023   Rotator cuff tear, right 04/22/2023   Excessive daytime sleepiness 03/17/2023   Fatigue 03/17/2023   GAD (generalized anxiety disorder) 03/10/2023   Anxiety attack 03/10/2023   Atypical chest pain 02/10/2023   Chronic obstructive pulmonary disease (HCC) 02/10/2023   Mild intermittent asthma with exacerbation 02/04/2023   Pain of great toe, left 12/12/2022   Gout 09/05/2022   Great toe pain, right 09/03/2022   Pruritus 08/05/2022   Chronic allergic rhinitis 07/08/2022   Migraine without aura and without status migrainosus, not intractable 03/28/2022   Seasonal allergic rhinitis due to pollen 12/24/2021   Seasonal allergies 12/24/2021   Decreased GFR 12/24/2021   Injury of right leg 10/14/2021   Bilateral leg edema 07/30/2021   Left ear impacted cerumen 07/30/2021   Vertigo 07/30/2021   Coronary artery calcification seen on CT scan 04/17/2021   Aortic atherosclerosis (HCC) 04/17/2021    Centrilobular emphysema (HCC) 04/17/2021   Grade I diastolic dysfunction 02/26/2021   CKD (chronic kidney disease) stage 3, GFR 30-59 ml/min (HCC) 02/20/2021   AKI (acute kidney injury) (HCC) 02/21/2020   Hyponatremia 02/20/2020   Hypocalcemia 02/20/2020   Low hemoglobin 02/20/2020   Herpes zoster with ophthalmic complication 02/03/2020   Acute intractable headache 01/31/2020   Vision changes 01/31/2020   Non-intractable vomiting 01/31/2020   Eye pain, right 01/31/2020   Plantar fasciitis, left 01/24/2020   Diarrhea 11/08/2019   OSA (obstructive sleep apnea) 09/20/2019   Hyperlipidemia 08/18/2019   Non-restorative sleep 08/17/2019   Snoring 08/17/2019   Class 2 obesity due to excess calories without serious comorbidity with body mass index (BMI) of 36.0 to 36.9 in adult 08/17/2019   Cervical spondylosis 05/10/2019   SOB (shortness of breath) on exertion 01/11/2019   Depressed mood 01/11/2019   Osteopenia 03/17/2018   Chronic venous stasis 12/22/2017   Edema of left ankle 12/22/2017   Reactive airway disease 06/09/2017   Former smoker 06/09/2017   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   Contusion 11/13/2015   Essential hypertension, benign 08/28/2015   Hiatal hernia 12/09/2013   Anxiety state 11/02/2013   GERD (gastroesophageal reflux disease) 11/02/2013    PCP: Vermell LITTIE Bologna, PA-C  REFERRING PROVIDER: Dr Elspeth CHRISTELLA Farrow   REFERRING DIAG: R shoulder scope with RCR, SAD, DCR  THERAPY DIAG:  Acute pain of right shoulder  Abnormal posture  Other symptoms and  signs involving the musculoskeletal system  Muscle weakness (generalized)  Rationale for Evaluation and Treatment: Rehabilitation  ONSET DATE: 01/06/24  SUBJECTIVE:                                                                                                                                                                                      SUBJECTIVE STATEMENT: Patient reports that she has less pain in the R  shoulder. She has not taken medication for pain in several days. She has been doing her exercises at home. She is still sleeping in recliner couch.   EVAL: Patient has a history of R rotator cuff tear with no known injury. Symptoms have been present since ~ 02/02/23. Patient underwent R arthroscopic RCR, DCR, SAD 01/06/24. She presents in abduction sling R UE. She has done okay since surgery. She is sleeping in her recliner and is very sedentary at this time. She has an ice machine that does some compression for home and that has helped with the pain. She sleeps with the ice machine and that helps a lot.  Hand dominance: Right  PERTINENT HISTORY: HTN; asthma; COPD; allergies; anxiety; headaches   PAIN:  Are you having pain? Yes: NPRS scale: 0/10 Pain location: mid arm(deltoid area); minimal to no pain in the shoulder joint  Pain description: muscle soreness  Aggravating factors: moving in her sleep; trying the pendulum  Relieving factors: ice machine; pain meds  PRECAUTIONS: Shoulder post op per protocol     WEIGHT BEARING RESTRICTIONS: Yes   FALLS:  Has patient fallen in last 6 months? Yes. 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  DIAGNOSTIC FINDINGS:  X-ray R shoulder: 04/06/24: FINDINGS: The bones are subjectively under mineralized. There is no evidence of fracture or dislocation. The alignment is normal. The joint spaces are normal. There is no evidence of arthropathy or other focal bone abnormality. Soft tissues  are unremarkable.   IMPRESSION: Subjective osteopenia/osteoporosis. Otherwise negative radiographs of the right shoulder  PATIENT SURVEYS:  Quick DASH: 84.1/100; 84.1%      SENSATION: WFL  POSTURE: Patient presents with head forward posture with increased thoracic kyphosis; shoulders rounded and elevated   UPPER EXTREMITY ROM: R shoulder  deferred due to surgery   Active/Passive ROM Right eval Left Eval AROM  Shoulder flexion  132  Shoulder extension  47  Shoulder abduction  124  Shoulder adduction    Shoulder internal rotation  T10  Shoulder external rotation  49  Elbow flexion 100   Elbow extension -40   Wrist flexion WFL's   Wrist extension WFL's   Wrist ulnar deviation    Wrist radial deviation    Forearm pronation WFL's    Forearm supination WFL's   (Blank rows = not tested)  UPPER EXTREMITY MMT: deferred due to surgery    MMT Right eval Left eval  Shoulder flexion    Shoulder extension    Shoulder abduction    Shoulder adduction    Shoulder internal rotation    Shoulder external rotation    Middle trapezius    Lower trapezius    Elbow flexion    Elbow extension    Wrist flexion    Wrist extension    Wrist ulnar deviation    Wrist radial deviation    Wrist pronation    Wrist supination    Grip strength (lbs)    (Blank rows = not tested)  PALPATION:  Tightness R shoulder girdle                                                                                                                              [facility] Adult PT Treatment:                                                DATE: 02/03/24 Therapeutic Exercise: Standing  Pendulum 30 CW/30 CCW Sitting  Fist open and close Wrist flexion/extension Wrist circumduction  Supination/pronation  Elbow flexion/extension to tolerance  Manual Therapy: STM R shoulder girdle patient sitting PROM R shoulder  Neuromuscular re-ed: Scap squeeze with coregeous ball thoracic spine 5 sec x 10  Therapeutic  Activity: Sitting  Shrugs x 10  Scap squeeze with noodle 3 sec x 10  Standing  Bent row x 5 per protocol at 4-6 weeks - pt reported some pain with not add to HEP at this time  Supine   Modalities: Vaso  Self Care: Reviewed protocol limitations Use of ice    [facility] Adult PT Treatment:                                                DATE: 01/27/24 Therapeutic Exercise: Standing  Pendulum 30 CW/30 CCW Sitting  Fist open and close Wrist flexion/extension Wrist circumduction  Supination/pronation  Elbow flexion/extension to tolerance  Manual Therapy: STM R shoulder girdle patient sitting PROM R shoulder  Neuromuscular re-ed: Scap squeeze with coregeous  ball thoracic spine 5 sec x 10  Therapeutic Activity:  Modalities: Vaso  Self Care: Reviewed protocol limitations Use of ice    TREATMENT DATE: 01/02/24 Review of evaluation and POC Initial HEP Post op precautions    PATIENT EDUCATION: Education details: POC; HEP Person educated: Patient Education method: Explanation, Demonstration, Tactile cues, Verbal cues, and Handouts Education comprehension: verbalized understanding, returned demonstration, verbal cues required, tactile cues required, and needs further education  HOME EXERCISE PROGRAM: Access Code: [email or web address] URL: [email or web address] Date: 02/03/2024 Prepared by: Sihaam Chrobak  Exercises - Seated Straight Fist AROM  - 2 x daily - 7 x weekly - 1 sets - 5 reps -   hold - Wrist AROM Flexion Extension  - 2 x daily - 7 x weekly - 1 sets - 5 reps - Wrist Circumduction AROM  - 2 x daily - 7 x weekly - 1 sets - 5-10 reps - Seated Forearm Pronation and Supination AROM  - 2 x daily - 7 x weekly - 1 sets - 5 reps - Seated Elbow Flexion and Extension AROM  - 2 x daily - 7 x weekly - 1 sets - 5 reps - Seated Scapular Retraction  - 2 x daily - 7 x weekly - 1-2 sets - 10 reps - 10 sec  hold - Seated Shoulder Shrugs  - 2 x daily - 7 x weekly - 1-2 sets - 10 reps -  2-3 sec  hold  ASSESSMENT:  CLINICAL IMPRESSION: Patient reports less pain in the R shoulder. She reports compliance with HEP and has continued to use abduction sling at home. Continued with wrist, hand ROM exercises. Added shoulder shrug for HEP per protocol. Trial of bent row produced some pain and was not added for HEP. Continued with manual work through the R shoulder girdle with PROM by PT within protocol parameters and tissue limitations. Range ~ 90 deg flexion; 60 deg abduction in scapular plane; ER 40 deg in scapular plane. Use of vaso post treatment to help prevent soreness and discomfort with treatment. Tolerated treatment well     Eval: Patient is a 73 y.o. female who was seen today for physical therapy evaluation and treatment  s/p R RCR, SAD, DCR 01/06/24 following nontraumatic complete tear of R RC. She presents decreased ROM and strength R UE, with R shoulder pain and decreased function. She has poor posture and alignment; limited cervical and shoulder ROM, mobility, strength, function. She will benefit from PT to address problems identified.   OBJECTIVE IMPAIRMENTS: decreased activity tolerance, decreased mobility, decreased ROM, decreased strength, increased fascial restrictions, increased muscle spasms, improper body mechanics, postural dysfunction, obesity, and pain.   GOALS: Goals reviewed with patient? Yes  SHORT TERM GOALS: Target date: 02/16/2024   Independent in initial HEP Baseline: Goal status: INITIAL  2.  Progress with PROM R shoulder per protocol  Baseline:  Goal status: INITIAL  3.  Initiate AROM exercises per protocol  Baseline:  Goal status: INITIAL   LONG TERM GOALS: Target date: 03/22/2024   Increase AROM R shoulder to WFL's as indicated per protocol  Baseline:  Goal status: INITIAL  2.  4/5 to 5/5 strength R shoulder  Baseline:  Goal status: INITIAL  3.  Patient reports use of R UE for functional activities  Baseline:  Goal status:  INITIAL  4.  Patient reports ability to sleep without awakening due to R shoulder pain  Baseline:  Goal status: INITIAL  5.  Independent in advanced HEP  Baseline:  Goal status: INITIAL  6.  Improve Quick DASH by 25-30%  Baseline: 84.1/100; 84.1% Goal status: INITIAL  PLAN:  PT FREQUENCY: 1-2x/week  PT DURATION: 10 weeks  PLANNED INTERVENTIONS: 97164- PT Re-evaluation, 97110-Therapeutic exercises, 97530- Therapeutic activity, 97112- Neuromuscular re-education, 97535- Self Care, [id number]- Manual therapy, 97016- Vasopneumatic device, and Patient/Family education  PLAN FOR NEXT SESSION: shoulder rehab per MD protocol    Florene SHAUNNA Baptist, PT 02/03/2024, 10:51 AM   Referring diagnosis? M25.511 Treatment diagnosis? (if different than referring diagnosis) M25.111; R29.3; R29.898; M62.81 What was this (referring dx) caused by? [x]  Surgery []  Fall []  Ongoing issue []  Arthritis []  Other: ____________  Laterality: [x]  Rt []  Lt []  Both  Check all possible CPT codes:  *CHOOSE 10 OR LESS*    See Planned Interventions listed in the Plan section of the Evaluation.

## 2024-02-05 ENCOUNTER — Other Ambulatory Visit: Payer: Self-pay | Admitting: Physician Assistant

## 2024-02-05 DIAGNOSIS — R0789 Other chest pain: Secondary | ICD-10-CM

## 2024-02-08 ENCOUNTER — Other Ambulatory Visit: Payer: Self-pay | Admitting: Physician Assistant

## 2024-02-16 ENCOUNTER — Ambulatory Visit: Admitting: Rehabilitative and Restorative Service Providers"

## 2024-02-16 ENCOUNTER — Encounter: Payer: Self-pay | Admitting: Rehabilitative and Restorative Service Providers"

## 2024-02-16 DIAGNOSIS — R29898 Other symptoms and signs involving the musculoskeletal system: Secondary | ICD-10-CM | POA: Diagnosis not present

## 2024-02-16 DIAGNOSIS — R2689 Other abnormalities of gait and mobility: Secondary | ICD-10-CM | POA: Diagnosis not present

## 2024-02-16 DIAGNOSIS — R293 Abnormal posture: Secondary | ICD-10-CM

## 2024-02-16 DIAGNOSIS — M6281 Muscle weakness (generalized): Secondary | ICD-10-CM | POA: Diagnosis not present

## 2024-02-16 DIAGNOSIS — M25511 Pain in right shoulder: Secondary | ICD-10-CM

## 2024-02-16 NOTE — Therapy (Signed)
 OUTPATIENT PHYSICAL THERAPY SHOULDER TREATMENT    Patient Name: Ana King MRN: [id number] DOB:January 24, 1951, 73 y.o., female Today's Date: 02/16/2024  END OF SESSION:  PT End of Session - 02/16/24 1443     Visit Number 4    Number of Visits 20    Date for PT Re-Evaluation 03/22/24    Authorization Type human[employer] prior auth required copay $25    PT Start Time 1445    PT Stop Time 1530    PT Time Calculation (min) 45 min    Activity Tolerance Patient tolerated treatment well          Past Medical History:  Diagnosis Date   Asthma    Essential hypertension, benign 08/28/2015   GERD (gastroesophageal reflux disease)    Past Surgical History:  Procedure Laterality Date   ABDOMINAL HYSTERECTOMY     Patient Active Problem List   Diagnosis Date Noted   Fall 01/01/2024   Hypotension 09/23/2023   Lung consolidation (HCC) 09/23/2023   Abnormal CT lung screening 09/23/2023   Hot flashes 09/23/2023   Opacity of lung on imaging study 06/29/2023   Pulmonary nodule 06/29/2023   Rotator cuff tear, right 04/22/2023   Excessive daytime sleepiness 03/17/2023   Fatigue 03/17/2023   GAD (generalized anxiety disorder) 03/10/2023   Anxiety attack 03/10/2023   Atypical chest pain 02/10/2023   Chronic obstructive pulmonary disease (HCC) 02/10/2023   Mild intermittent asthma with exacerbation 02/04/2023   Pain of great toe, left 12/12/2022   Gout 09/05/2022   Great toe pain, right 09/03/2022   Pruritus 08/05/2022   Chronic allergic rhinitis 07/08/2022   Migraine without aura and without status migrainosus, not intractable 03/28/2022   Seasonal allergic rhinitis due to pollen 12/24/2021   Seasonal allergies 12/24/2021   Decreased GFR 12/24/2021   Injury of right leg 10/14/2021   Bilateral leg edema 07/30/2021   Left ear impacted cerumen 07/30/2021   Vertigo 07/30/2021   Coronary artery calcification seen on CT scan 04/17/2021   Aortic atherosclerosis (HCC) 04/17/2021    Centrilobular emphysema (HCC) 04/17/2021   Grade I diastolic dysfunction 02/26/2021   CKD (chronic kidney disease) stage 3, GFR 30-59 ml/min (HCC) 02/20/2021   AKI (acute kidney injury) (HCC) 02/21/2020   Hyponatremia 02/20/2020   Hypocalcemia 02/20/2020   Low hemoglobin 02/20/2020   Herpes zoster with ophthalmic complication 02/03/2020   Acute intractable headache 01/31/2020   Vision changes 01/31/2020   Non-intractable vomiting 01/31/2020   Eye pain, right 01/31/2020   Plantar fasciitis, left 01/24/2020   Diarrhea 11/08/2019   OSA (obstructive sleep apnea) 09/20/2019   Hyperlipidemia 08/18/2019   Non-restorative sleep 08/17/2019   Snoring 08/17/2019   Class 2 obesity due to excess calories without serious comorbidity with body mass index (BMI) of 36.0 to 36.9 in adult 08/17/2019   Cervical spondylosis 05/10/2019   SOB (shortness of breath) on exertion 01/11/2019   Depressed mood 01/11/2019   Osteopenia 03/17/2018   Chronic venous stasis 12/22/2017   Edema of left ankle 12/22/2017   Reactive airway disease 06/09/2017   Former smoker 06/09/2017   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   Contusion 11/13/2015   Essential hypertension, benign 08/28/2015   Hiatal hernia 12/09/2013   Anxiety state 11/02/2013   GERD (gastroesophageal reflux disease) 11/02/2013    PCP: Vermell LITTIE Bologna, PA-C  REFERRING PROVIDER: Dr Elspeth CHRISTELLA Farrow   REFERRING DIAG: R shoulder scope with RCR, SAD, DCR  THERAPY DIAG:  Acute pain of right shoulder  Abnormal posture  Other symptoms and  signs involving the musculoskeletal system  Muscle weakness (generalized)  Rationale for Evaluation and Treatment: Rehabilitation  ONSET DATE: 01/06/24  SUBJECTIVE:                                                                                                                                                                                      SUBJECTIVE STATEMENT: Patient reports that she had some increase in pain in  the R shoulder yesterday. She OTC medication for pain last night. She has been doing her exercises at home. She is still sleeping in recliner couch.   EVAL: Patient has a history of R rotator cuff tear with no known injury. Symptoms have been present since ~ 02/02/23. Patient underwent R arthroscopic RCR, DCR, SAD 01/06/24. She presents in abduction sling R UE. She has done okay since surgery. She is sleeping in her recliner and is very sedentary at this time. She has an ice machine that does some compression for home and that has helped with the pain. She sleeps with the ice machine and that helps a lot.  Hand dominance: Right  PERTINENT HISTORY: HTN; asthma; COPD; allergies; anxiety; headaches   PAIN:  Are you having pain? Yes: NPRS scale: 1/10 Pain location: mid arm(deltoid area); minimal to no pain in the shoulder joint  Pain description: muscle soreness  Aggravating factors: moving in her sleep; trying the pendulum  Relieving factors: ice machine; pain meds  PRECAUTIONS: Shoulder post op per protocol     WEIGHT BEARING RESTRICTIONS: Yes   FALLS:  Has patient fallen in last 6 months? Yes. 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  DIAGNOSTIC FINDINGS:  X-ray R shoulder: 04/06/24: FINDINGS: The bones are subjectively under mineralized. There is no evidence of fracture or dislocation. The alignment is normal. The joint spaces are normal. There is no evidence of arthropathy or other focal bone abnormality. Soft tissues  are unremarkable.   IMPRESSION: Subjective osteopenia/osteoporosis. Otherwise negative radiographs of the right shoulder  PATIENT SURVEYS:  Quick DASH: 84.1/100; 84.1%      SENSATION: WFL  POSTURE: Patient presents with head forward posture with increased thoracic kyphosis; shoulders rounded and elevated   UPPER EXTREMITY ROM: R shoulder  deferred due to surgery at eval     02/16/24: painful and tight end rang motion - patient with difficulty relaxing for PT ROM  Active/Passive ROM Right eval Left Eval AROM Right  PROM 02/16/24  Shoulder flexion  132 91  Shoulder extension  47   Shoulder abduction (scaption)  124 60  Shoulder adduction     Shoulder internal rotation  T10   Shoulder external rotation  49 30  Elbow flexion 100    Elbow extension -40    Wrist flexion WFL's    Wrist extension WFL's    Wrist ulnar deviation     Wrist radial deviation     Forearm pronation WFL's     Forearm supination WFL's    (Blank rows = not tested)  UPPER EXTREMITY MMT: deferred due to surgery    MMT Right eval Left eval  Shoulder flexion    Shoulder extension    Shoulder abduction    Shoulder adduction    Shoulder internal rotation    Shoulder external rotation    Middle trapezius    Lower trapezius    Elbow flexion    Elbow extension    Wrist flexion    Wrist extension    Wrist ulnar deviation    Wrist radial deviation    Wrist pronation    Wrist supination    Grip strength (lbs)    (Blank rows = not tested)  PALPATION:  Tightness R shoulder girdle                                                                                                                              [facility] Adult PT Treatment:                                                DATE: 02/16/24 Therapeutic Exercise: Standing  Pendulum 30 CW/30 CCW  Sitting (HEP)  Fist open and close Wrist flexion/extension Wrist circumduction  Supination/pronation  Elbow flexion/extension to tolerance  Manual  Therapy: STM R shoulder girdle patient sitting PROM R shoulder  Neuromuscular re-ed: Scap squeeze with coregeous ball thoracic spine 5 sec x 10  Therapeutic Activity: Sitting  Shrugs x 10  Scap squeeze with noodle 3 sec x 10  Standing  Bent row x 10 per protocol at 4-6 weeks  Standing with noodle, towel under arm for shoulder ER with cane 10 sec x 10  Supine   Modalities: Vaso  Self Care: Reviewed protocol limitations Encouraged use of ice    [facility] Adult PT Treatment:                                                DATE:  02/03/24 Therapeutic Exercise:. Standing  Pendulum 30 CW/30 CCW Sitting  Fist open and close Wrist flexion/extension Wrist circumduction  Supination/pronation  Elbow flexion/extension to tolerance  Manual Therapy: STM R shoulder girdle patient sitting PROM R shoulder  Neuromuscular re-ed: Scap squeeze with coregeous ball thoracic spine 5 sec x 10  Therapeutic Activity: Sitting  Shrugs x 10  Scap squeeze with noodle 3 sec x 10  Standing  Bent row x 5 per protocol at 4-6 weeks - pt reported some pain with not add to HEP at this time  Supine   Modalities: Vaso  Self Care: Reviewed protocol limitations Use of ice    PATIENT EDUCATION: Education details: POC; HEP Person educated: Patient Education method: Programmer, multimedia, Demonstration, Tactile cues, Verbal cues, and Handouts Education comprehension: verbalized understanding, returned demonstration, verbal cues required, tactile cues required, and needs further education  HOME EXERCISE PROGRAM: Access Code: [email or web address] URL: [email or web address] Date: 02/03/2024 Prepared by: Porschea Borys  Exercises - Seated Straight Fist AROM  - 2 x daily - 7 x weekly - 1 sets - 5 reps -   hold - Wrist AROM Flexion Extension  - 2 x daily - 7 x weekly - 1 sets - 5 reps - Wrist Circumduction AROM  - 2 x daily - 7 x weekly - 1 sets - 5-10 reps - Seated Forearm Pronation and Supination AROM  - 2 x daily -  7 x weekly - 1 sets - 5 reps - Seated Elbow Flexion and Extension AROM  - 2 x daily - 7 x weekly - 1 sets - 5 reps - Seated Scapular Retraction  - 2 x daily - 7 x weekly - 1-2 sets - 10 reps - 10 sec  hold - Seated Shoulder Shrugs  - 2 x daily - 7 x weekly - 1-2 sets - 10 reps - 2-3 sec  hold  ASSESSMENT:  CLINICAL IMPRESSION: Patient reports some increase in pain in the R shoulder in the past 24 hours with no known cause. She reports compliance with HEP and has continued to use abduction sling at home. Working with wrist, hand ROM exercises as well sa shoulder exercises as allowed by protocol. Continued with manual work through the R shoulder girdle with PROM by PT within protocol parameters and tissue limitations. Patient has difficulty relaxing for PROM and stretching as permitted per protocol. Range ~ 90 deg flexion; 60 deg abduction in scapular plane; ER 30 deg in scapular plane. Use of vaso post treatment to help prevent soreness and discomfort with treatment. Tolerated treatment well     Eval: Patient is a 73 y.o. female who was seen today for physical therapy evaluation and treatment  s/p R RCR, SAD, DCR 01/06/24 following nontraumatic complete tear of R RC. She presents decreased ROM and strength R UE, with R shoulder pain and decreased function. She has poor posture and alignment; limited cervical and shoulder ROM, mobility, strength, function. She will benefit from PT to address problems identified.   OBJECTIVE IMPAIRMENTS: decreased activity tolerance, decreased mobility, decreased ROM, decreased strength, increased fascial restrictions, increased muscle spasms, improper body mechanics, postural dysfunction, obesity, and pain.   GOALS: Goals reviewed with patient? Yes  SHORT TERM GOALS: Target date: 02/16/2024   Independent in initial HEP Baseline: Goal status: INITIAL  2.  Progress with PROM R shoulder per protocol  Baseline:  Goal status: INITIAL  3.  Initiate AROM exercises  per protocol  Baseline:  Goal status: INITIAL   LONG TERM GOALS: Target date:  03/22/2024   Increase AROM R shoulder to WFL's as indicated per protocol  Baseline:  Goal status: INITIAL  2.  4/5 to 5/5 strength R shoulder  Baseline:  Goal status: INITIAL  3.  Patient reports use of R UE for functional activities  Baseline:  Goal status: INITIAL  4.  Patient reports ability to sleep without awakening due to R shoulder pain  Baseline:  Goal status: INITIAL  5.  Independent in advanced HEP  Baseline:  Goal status: INITIAL  6.  Improve Quick DASH by 25-30%  Baseline: 84.1/100; 84.1% Goal status: INITIAL  PLAN:  PT FREQUENCY: 1-2x/week  PT DURATION: 10 weeks  PLANNED INTERVENTIONS: 97164- PT Re-evaluation, 97110-Therapeutic exercises, 97530- Therapeutic activity, 97112- Neuromuscular re-education, 97535- Self Care, [id number]- Manual therapy, 97016- Vasopneumatic device, and Patient/Family education  PLAN FOR NEXT SESSION: shoulder rehab per MD protocol    Florene SHAUNNA Baptist, PT 02/16/2024, 2:44 PM   Referring diagnosis? M25.511 Treatment diagnosis? (if different than referring diagnosis) M25.111; R29.3; R29.898; M62.81 What was this (referring dx) caused by? [x]  Surgery []  Fall []  Ongoing issue []  Arthritis []  Other: ____________  Laterality: [x]  Rt []  Lt []  Both  Check all possible CPT codes:  *CHOOSE 10 OR LESS*    See Planned Interventions listed in the Plan section of the Evaluation.

## 2024-02-23 ENCOUNTER — Encounter: Payer: Self-pay | Admitting: Rehabilitative and Restorative Service Providers"

## 2024-02-23 ENCOUNTER — Ambulatory Visit: Attending: Orthopaedic Surgery | Admitting: Rehabilitative and Restorative Service Providers"

## 2024-02-23 DIAGNOSIS — M6281 Muscle weakness (generalized): Secondary | ICD-10-CM | POA: Insufficient documentation

## 2024-02-23 DIAGNOSIS — R293 Abnormal posture: Secondary | ICD-10-CM | POA: Diagnosis not present

## 2024-02-23 DIAGNOSIS — M25511 Pain in right shoulder: Secondary | ICD-10-CM | POA: Insufficient documentation

## 2024-02-23 DIAGNOSIS — R29898 Other symptoms and signs involving the musculoskeletal system: Secondary | ICD-10-CM | POA: Diagnosis not present

## 2024-02-23 NOTE — Therapy (Signed)
 OUTPATIENT PHYSICAL THERAPY SHOULDER TREATMENT    Patient Name: Ana King MRN: [id number] DOB:11-Oct-1950, 73 y.o., female Today's Date: 02/23/2024  END OF SESSION:  PT End of Session - 02/23/24 1451     Visit Number 5    Number of Visits 20    Date for PT Re-Evaluation 03/22/24    Authorization Type human[employer] prior auth required copay $25    PT Start Time 1445    PT Stop Time 1530    PT Time Calculation (min) 45 min    Activity Tolerance Patient tolerated treatment well          Past Medical History:  Diagnosis Date   Asthma    Essential hypertension, benign 08/28/2015   GERD (gastroesophageal reflux disease)    Past Surgical History:  Procedure Laterality Date   ABDOMINAL HYSTERECTOMY     Patient Active Problem List   Diagnosis Date Noted   Fall 01/01/2024   Hypotension 09/23/2023   Lung consolidation (HCC) 09/23/2023   Abnormal CT lung screening 09/23/2023   Hot flashes 09/23/2023   Opacity of lung on imaging study 06/29/2023   Pulmonary nodule 06/29/2023   Rotator cuff tear, right 04/22/2023   Excessive daytime sleepiness 03/17/2023   Fatigue 03/17/2023   GAD (generalized anxiety disorder) 03/10/2023   Anxiety attack 03/10/2023   Atypical chest pain 02/10/2023   Chronic obstructive pulmonary disease (HCC) 02/10/2023   Mild intermittent asthma with exacerbation 02/04/2023   Pain of great toe, left 12/12/2022   Gout 09/05/2022   Great toe pain, right 09/03/2022   Pruritus 08/05/2022   Chronic allergic rhinitis 07/08/2022   Migraine without aura and without status migrainosus, not intractable 03/28/2022   Seasonal allergic rhinitis due to pollen 12/24/2021   Seasonal allergies 12/24/2021   Decreased GFR 12/24/2021   Injury of right leg 10/14/2021   Bilateral leg edema 07/30/2021   Left ear impacted cerumen 07/30/2021   Vertigo 07/30/2021   Coronary artery calcification seen on CT scan 04/17/2021   Aortic atherosclerosis (HCC) 04/17/2021   Centrilobular  emphysema (HCC) 04/17/2021   Grade I diastolic dysfunction 02/26/2021   CKD (chronic kidney disease) stage 3, GFR 30-59 ml/min (HCC) 02/20/2021   AKI (acute kidney injury) (HCC) 02/21/2020   Hyponatremia 02/20/2020   Hypocalcemia 02/20/2020   Low hemoglobin 02/20/2020   Herpes zoster with ophthalmic complication 02/03/2020   Acute intractable headache 01/31/2020   Vision changes 01/31/2020   Non-intractable vomiting 01/31/2020   Eye pain, right 01/31/2020   Plantar fasciitis, left 01/24/2020   Diarrhea 11/08/2019   OSA (obstructive sleep apnea) 09/20/2019   Hyperlipidemia 08/18/2019   Non-restorative sleep 08/17/2019   Snoring 08/17/2019   Class 2 obesity due to excess calories without serious comorbidity with body mass index (BMI) of 36.0 to 36.9 in adult 08/17/2019   Cervical spondylosis 05/10/2019   SOB (shortness of breath) on exertion 01/11/2019   Depressed mood 01/11/2019   Osteopenia 03/17/2018   Chronic venous stasis 12/22/2017   Edema of left ankle 12/22/2017   Reactive airway disease 06/09/2017   Former smoker 06/09/2017   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   Contusion 11/13/2015   Essential hypertension, benign 08/28/2015   Hiatal hernia 12/09/2013   Anxiety state 11/02/2013   GERD (gastroesophageal reflux disease) 11/02/2013    PCP: Vermell LITTIE Bologna, PA-C  REFERRING PROVIDER: Dr Elspeth CHRISTELLA Farrow   REFERRING DIAG: R shoulder scope with RCR, SAD, DCR  THERAPY DIAG:  Acute pain of right shoulder  Abnormal posture  Other symptoms and  signs involving the musculoskeletal system  Muscle weakness (generalized)  Rationale for Evaluation and Treatment: Rehabilitation  ONSET DATE: 01/06/24  SUBJECTIVE:                                                                                                                                                                                      SUBJECTIVE STATEMENT: Patient reports that the doctor is concerned with tightness in the  R shoulder. She has a friend that has a pulley she can borrow.  She is still sleeping in recliner couch. She gets a new mattress tomorrow.   EVAL: Patient has a history of R rotator cuff tear with no known injury. Symptoms have been present since ~ 02/02/23. Patient underwent R arthroscopic RCR, DCR, SAD 01/06/24. She presents in abduction sling R UE. She has done okay since surgery. She is sleeping in her recliner and is very sedentary at this time. She has an ice machine that does some compression for home and that has helped with the pain. She sleeps with the ice machine and that helps a lot.  Hand dominance: Right  PERTINENT HISTORY: HTN; asthma; COPD; allergies; anxiety; headaches   PAIN:  Are you having pain? Yes: NPRS scale: 0/10; increases with exercises Pain location: mid arm(deltoid area); minimal to no pain in the shoulder joint  Pain description: muscle soreness  Aggravating factors: moving in her sleep; trying the pendulum  Relieving factors: ice machine; pain meds  PRECAUTIONS: Shoulder post op per protocol     WEIGHT BEARING RESTRICTIONS: Yes   FALLS:  Has patient fallen in last 6 months? Yes. 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  DIAGNOSTIC FINDINGS:  X-ray R shoulder: 04/06/24: FINDINGS: The bones are subjectively under mineralized. There is no evidence of fracture or dislocation. The alignment is normal. The joint spaces are normal. There is no evidence of arthropathy or other focal bone  abnormality. Soft tissues are unremarkable.   IMPRESSION: Subjective osteopenia/osteoporosis. Otherwise negative radiographs of the right shoulder  PATIENT SURVEYS:  Quick DASH: 84.1/100; 84.1%      SENSATION: WFL  POSTURE: Patient presents with head forward posture with increased thoracic kyphosis; shoulders rounded and elevated  UPPER EXTREMITY ROM: R shoulder deferred due to surgery at eval     02/16/24: painful and tight end rang motion - patient with difficulty relaxing for PT ROM  Active/Passive ROM Right eval Left Eval AROM Right  PROM 02/16/24  Shoulder flexion  132 91  Shoulder extension  47   Shoulder abduction (scaption)  124 60  Shoulder adduction     Shoulder internal rotation  T10   Shoulder external rotation  49 30  Elbow flexion 100    Elbow extension -40    Wrist flexion WFL's    Wrist extension WFL's    Wrist ulnar deviation     Wrist radial deviation     Forearm pronation WFL's     Forearm supination WFL's    (Blank rows = not tested)  UPPER EXTREMITY MMT: deferred due to surgery    MMT Right eval Left eval  Shoulder flexion    Shoulder extension    Shoulder abduction    Shoulder adduction    Shoulder internal rotation    Shoulder external rotation    Middle trapezius    Lower trapezius    Elbow flexion    Elbow extension    Wrist flexion    Wrist extension    Wrist ulnar deviation    Wrist radial deviation    Wrist pronation    Wrist supination    Grip strength (lbs)    (Blank rows = not tested)  PALPATION:  Tightness R shoulder girdle                                                                                                                              [facility] Adult PT Treatment:                                                DATE: 02/23/24 Therapeutic Exercise: Standing  Pendulum 30 CW/30 CCW  Sitting   Pulley flexion 10 sec x 10  Pulley scaption 10 sec x 10  Pulley horizontal ab/adduction at ~ 70 deg abduction x 10    Manual Therapy: STM R shoulder girdle patient sitting PROM R shoulder into flexion; scaption; ER at side  Neuromuscular re-ed: Scap squeeze with coregeous ball thoracic spine 5 sec x 10  Therapeutic Activity: Sitting  Shrugs x 10  Scap squeeze with noodle 3 sec x 10  Active shoulder flexion R as possible x 5  Standing  Bent row x 10 per protocol at 4-6 weeks  Standing with noodle, towel under arm for shoulder ER with cane 10 sec x 10  Shoulder flexion step back at table 10 sec x 5  Supine   Modalities: Vaso medium pressure; 34 deg; 10 min  Self Care: Discussed importance of exercise and progression with ROM  Encouraged use of ice  [facility] Adult PT Treatment:                                                DATE: 02/16/24 Therapeutic Exercise: Standing  Pendulum 30 CW/30 CCW  Sitting (HEP)  Fist open and close Wrist flexion/extension Wrist circumduction  Supination/pronation  Elbow flexion/extension to tolerance  Manual Therapy: STM R shoulder girdle patient sitting PROM R shoulder  Neuromuscular re-ed: Scap squeeze with coregeous ball thoracic spine 5 sec x 10  Therapeutic Activity: Sitting  Shrugs x 10  Scap squeeze with noodle 3 sec x 10  Standing  Bent row x 10 per protocol at 4-6 weeks  Standing with noodle, towel under arm for shoulder ER with cane 10 sec x 10  Supine   Modalities: Vaso  Self Care: Reviewed protocol limitations Encouraged use of ice   PATIENT EDUCATION: Education details: POC; HEP Person educated: Patient Education method: Programmer, multimedia, Demonstration, Tactile cues, Verbal cues, and Handouts Education comprehension: verbalized understanding, returned demonstration, verbal cues required, tactile cues required, and needs further education  HOME EXERCISE PROGRAM: Access Code: [email or web address] URL: [email or web address] Date: 02/23/2024 Prepared by: Bowie Doiron  Exercises - Seated Straight Fist AROM  - 2 x daily - 7 x weekly - 1  sets - 5 reps -   hold - Wrist AROM Flexion Extension  - 2 x daily - 7 x weekly - 1 sets - 5 reps - Wrist Circumduction AROM  - 2 x daily - 7 x weekly - 1 sets - 5-10 reps - Seated Forearm Pronation and Supination AROM  - 2 x daily - 7 x weekly - 1 sets - 5 reps - Seated Elbow Flexion and Extension AROM  - 2 x daily - 7 x weekly - 1 sets - 5 reps - Seated Scapular Retraction  - 2 x daily - 7 x weekly - 1-2 sets - 10 reps - 10 sec  hold - Seated Shoulder Shrugs  - 2 x daily - 7 x weekly - 1-2 sets - 10 reps - 2-3 sec  hold - Seated Shoulder Flexion AAROM with Pulley Behind  - 2 x daily - 7 x weekly - 1 sets - 10 reps - 10 sec  hold - Seated Shoulder Scaption AAROM with Pulley at Side  - 2 x daily - 7 x weekly - 1 sets - 10 reps - 10sec  hold - Standing Shoulder and Trunk Flexion at Table  - 2 x daily - 7 x weekly - 1 sets - 5-10 reps - 10 sec  hold - Seated Shoulder External Rotation AAROM with Cane and Hand in Neutral  - 2 x daily - 7 x weekly - 1 sets - 5-10 reps - 5-10 sec  hold - Seated Shoulder Flexion  - 2 x daily - 7 x weekly - 1 sets - 5-10 reps - 2-3 sec  hold  ASSESSMENT:  CLINICAL IMPRESSION: Patient reports she can be out of the sling when at home. She wears the sling when outside the home. Still sleeping in the recliner but will try to transition to the bed when she gets her new mattress tomorrow. She reports compliance with HEP. Progressed with AAROM with pulleys. Added shoulder flexion step back at counter and active shoulder flexion sitting through partial range(limited). Continued with manual work through the R shoulder girdle with PROM by PT  within protocol parameters and tissue limitations. Patient has difficulty relaxing for PROM and stretching as permitted per protocol. Use of vaso post treatment to help prevent soreness and discomfort with treatment.     Eval: Patient is a 73 y.o. female who was seen today for physical therapy evaluation and treatment  s/p R RCR, SAD, DCR  01/06/24 following nontraumatic complete tear of R RC. She presents decreased ROM and strength R UE, with R shoulder pain and decreased function. She has poor posture and alignment; limited cervical and shoulder ROM, mobility, strength, function. She will benefit from PT to address problems identified.   OBJECTIVE IMPAIRMENTS: decreased activity tolerance, decreased mobility, decreased ROM, decreased strength, increased fascial restrictions, increased muscle spasms, improper body mechanics, postural dysfunction, obesity, and pain.   GOALS: Goals reviewed with patient? Yes  SHORT TERM GOALS: Target date: 02/16/2024   Independent in initial HEP Baseline: Goal status: met  2.  Progress with PROM R shoulder per protocol  Baseline:  Goal status: on going   3.  Initiate AROM exercises per protocol  Baseline:  Goal status: on going    LONG TERM GOALS: Target date: 03/22/2024   Increase AROM R shoulder to WFL's as indicated per protocol  Baseline:  Goal status: on going   2.  4/5 to 5/5 strength R shoulder  Baseline:  Goal status: on going   3.  Patient reports use of R UE for functional activities  Baseline:  Goal status: on going   4.  Patient reports ability to sleep without awakening due to R shoulder pain  Baseline:  Goal status: on going   5.  Independent in advanced HEP  Baseline:  Goal status: on going   6.  Improve Quick DASH by 25-30%  Baseline: 84.1/100; 84.1% Goal status: on going   PLAN:  PT FREQUENCY: 1-2x/week  PT DURATION: 10 weeks  PLANNED INTERVENTIONS: 97164- PT Re-evaluation, 97110-Therapeutic exercises, 97530- Therapeutic activity, 97112- Neuromuscular re-education, 97535- Self Care, [id number]- Manual therapy, 97016- Vasopneumatic device, and Patient/Family education  PLAN FOR NEXT SESSION: shoulder rehab per MD protocol - add active shoulder flexion in standing; rows; isometric IR/ER; work on ROM in supine    [employer], PT 02/23/2024, 3:31 PM    Referring diagnosis? M25.511 Treatment diagnosis? (if different than referring diagnosis) M25.111; R29.3; R29.898; M62.81 What was this (referring dx) caused by? [x]  Surgery []  Fall []  Ongoing issue []  Arthritis []  Other: ____________  Laterality: [x]  Rt []  Lt []  Both  Check all possible CPT codes:  *CHOOSE 10 OR LESS*    See Planned Interventions listed in the Plan section of the Evaluation.

## 2024-02-24 ENCOUNTER — Other Ambulatory Visit: Payer: Self-pay | Admitting: Physician Assistant

## 2024-02-24 DIAGNOSIS — R600 Localized edema: Secondary | ICD-10-CM

## 2024-02-25 ENCOUNTER — Encounter: Payer: Self-pay | Admitting: Rehabilitative and Restorative Service Providers"

## 2024-02-25 ENCOUNTER — Ambulatory Visit: Payer: Self-pay | Admitting: Rehabilitative and Restorative Service Providers"

## 2024-02-25 DIAGNOSIS — M25511 Pain in right shoulder: Secondary | ICD-10-CM

## 2024-02-25 DIAGNOSIS — M6281 Muscle weakness (generalized): Secondary | ICD-10-CM | POA: Diagnosis not present

## 2024-02-25 DIAGNOSIS — R293 Abnormal posture: Secondary | ICD-10-CM | POA: Diagnosis not present

## 2024-02-25 DIAGNOSIS — R29898 Other symptoms and signs involving the musculoskeletal system: Secondary | ICD-10-CM

## 2024-02-25 NOTE — Therapy (Signed)
 OUTPATIENT PHYSICAL THERAPY SHOULDER TREATMENT    Patient Name: Ana King MRN: [id number] DOB:16-Dec-1950, 73 y.o., female Today's Date: 02/25/2024  END OF SESSION:  PT End of Session - 02/25/24 1111     Visit Number 6    Number of Visits 20    Date for PT Re-Evaluation 03/22/24    Authorization Type human[employer] prior auth required copay $25    Progress Note Due on Visit 10    PT Start Time 1100    PT Stop Time 1145    PT Time Calculation (min) 45 min    Activity Tolerance Patient tolerated treatment well          Past Medical History:  Diagnosis Date   Asthma    Essential hypertension, benign 08/28/2015   GERD (gastroesophageal reflux disease)    Past Surgical History:  Procedure Laterality Date   ABDOMINAL HYSTERECTOMY     Patient Active Problem List   Diagnosis Date Noted   Fall 01/01/2024   Hypotension 09/23/2023   Lung consolidation (HCC) 09/23/2023   Abnormal CT lung screening 09/23/2023   Hot flashes 09/23/2023   Opacity of lung on imaging study 06/29/2023   Pulmonary nodule 06/29/2023   Rotator cuff tear, right 04/22/2023   Excessive daytime sleepiness 03/17/2023   Fatigue 03/17/2023   GAD (generalized anxiety disorder) 03/10/2023   Anxiety attack 03/10/2023   Atypical chest pain 02/10/2023   Chronic obstructive pulmonary disease (HCC) 02/10/2023   Mild intermittent asthma with exacerbation 02/04/2023   Pain of great toe, left 12/12/2022   Gout 09/05/2022   Great toe pain, right 09/03/2022   Pruritus 08/05/2022   Chronic allergic rhinitis 07/08/2022   Migraine without aura and without status migrainosus, not intractable 03/28/2022   Seasonal allergic rhinitis due to pollen 12/24/2021   Seasonal allergies 12/24/2021   Decreased GFR 12/24/2021   Injury of right leg 10/14/2021   Bilateral leg edema 07/30/2021   Left ear impacted cerumen 07/30/2021   Vertigo 07/30/2021   Coronary artery calcification seen on CT scan 04/17/2021   Aortic atherosclerosis  (HCC) 04/17/2021   Centrilobular emphysema (HCC) 04/17/2021   Grade I diastolic dysfunction 02/26/2021   CKD (chronic kidney disease) stage 3, GFR 30-59 ml/min (HCC) 02/20/2021   AKI (acute kidney injury) (HCC) 02/21/2020   Hyponatremia 02/20/2020   Hypocalcemia 02/20/2020   Low hemoglobin 02/20/2020   Herpes zoster with ophthalmic complication 02/03/2020   Acute intractable headache 01/31/2020   Vision changes 01/31/2020   Non-intractable vomiting 01/31/2020   Eye pain, right 01/31/2020   Plantar fasciitis, left 01/24/2020   Diarrhea 11/08/2019   OSA (obstructive sleep apnea) 09/20/2019   Hyperlipidemia 08/18/2019   Non-restorative sleep 08/17/2019   Snoring 08/17/2019   Class 2 obesity due to excess calories without serious comorbidity with body mass index (BMI) of 36.0 to 36.9 in adult 08/17/2019   Cervical spondylosis 05/10/2019   SOB (shortness of breath) on exertion 01/11/2019   Depressed mood 01/11/2019   Osteopenia 03/17/2018   Chronic venous stasis 12/22/2017   Edema of left ankle 12/22/2017   Reactive airway disease 06/09/2017   Former smoker 06/09/2017   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   Contusion 11/13/2015   Essential hypertension, benign 08/28/2015   Hiatal hernia 12/09/2013   Anxiety state 11/02/2013   GERD (gastroesophageal reflux disease) 11/02/2013    PCP: Vermell LITTIE Bologna, PA-C  REFERRING PROVIDER: Dr Elspeth CHRISTELLA Farrow   REFERRING DIAG: R shoulder scope with RCR, SAD, DCR  THERAPY DIAG:  Acute pain of  right shoulder  Abnormal posture  Other symptoms and signs involving the musculoskeletal system  Muscle weakness (generalized)  Rationale for Evaluation and Treatment: Rehabilitation  ONSET DATE: 01/06/24  SUBJECTIVE:                                                                                                                                                                                      SUBJECTIVE STATEMENT: Patient reports that she is  supposed to get a pulley to use at home today. She has been doing her exercises and trying to use her arm for some functional activities in lower positions. She has a new mattress but has to get used to it.   Post MD visit; Patient reports that the doctor is concerned with tightness in the R shoulder.   EVAL: Patient has a history of R rotator cuff tear with no known injury. Symptoms have been present since ~ 02/02/23. Patient underwent R arthroscopic RCR, DCR, SAD 01/06/24. She presents in abduction sling R UE. She has done okay since surgery. She is sleeping in her recliner and is very sedentary at this time. She has an ice machine that does some compression for home and that has helped with the pain. She sleeps with the ice machine and that helps a lot.  Hand dominance: Right  PERTINENT HISTORY: HTN; asthma; COPD; allergies; anxiety; headaches   PAIN:  Are you having pain? Yes: NPRS scale: 1/10; increases with exercises Pain location: mid arm(deltoid area); minimal to no pain in the shoulder joint  Pain description: muscle soreness  Aggravating factors: moving in her sleep; trying the pendulum  Relieving factors: ice machine; pain meds  PRECAUTIONS: Shoulder post op per protocol     WEIGHT BEARING RESTRICTIONS: Yes   FALLS:  Has patient fallen in last 6 months? Yes. 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  DIAGNOSTIC FINDINGS:  X-ray R shoulder: 04/06/24: FINDINGS: The bones are subjectively under mineralized. There  is no evidence of fracture or dislocation. The alignment is normal. The joint spaces are normal. There is no evidence of arthropathy or other focal bone abnormality. Soft tissues are unremarkable.   IMPRESSION: Subjective osteopenia/osteoporosis. Otherwise negative radiographs of the  right shoulder  PATIENT SURVEYS:  Quick DASH: 84.1/100; 84.1%      SENSATION: WFL  POSTURE: Patient presents with head forward posture with increased thoracic kyphosis; shoulders rounded and elevated   UPPER EXTREMITY ROM: R shoulder deferred due to surgery at eval     02/16/24: painful and tight end rang motion - patient with difficulty relaxing for PT ROM  Active/Passive ROM Right eval Left Eval AROM Right  PROM 02/16/24  Shoulder flexion  132 91  Shoulder extension  47   Shoulder abduction (scaption)  124 60  Shoulder adduction     Shoulder internal rotation  T10   Shoulder external rotation  49 30  Elbow flexion 100    Elbow extension -40    Wrist flexion WFL's    Wrist extension WFL's    Wrist ulnar deviation     Wrist radial deviation     Forearm pronation WFL's     Forearm supination WFL's    (Blank rows = not tested)  UPPER EXTREMITY MMT: deferred due to surgery    MMT Right eval Left eval  Shoulder flexion    Shoulder extension    Shoulder abduction    Shoulder adduction    Shoulder internal rotation    Shoulder external rotation    Middle trapezius    Lower trapezius    Elbow flexion    Elbow extension    Wrist flexion    Wrist extension    Wrist ulnar deviation    Wrist radial deviation    Wrist pronation    Wrist supination    Grip strength (lbs)    (Blank rows = not tested)  PALPATION:  Tightness R shoulder girdle    [facility] Adult PT Treatment:                                                DATE: 02/25/24 Therapeutic Exercise: Standing  Pendulum 30 CW/30 CCW  Sitting   Pulley flexion 10 sec x 10  Pulley scaption 10 sec x 10  Pulley horizontal  ab/adduction at ~ 70 deg abduction x 10   Manual Therapy: STM R shoulder girdle patient sitting PROM R shoulder into flexion; scaption; ER at side  Neuromuscular re-ed: Scap squeeze with coregeous ball thoracic spine 5 sec x 10  Therapeutic Activity: Sitting  Shrugs x 10  Scap squeeze with noodle 7-10 sec x 10  Table slide ~ 5 sec x 10  Active shoulder flexion R as possible x 5  Standing  Bent row x 10  Scap squeeze with noodle 7-10 sec x 10 Standing with noodle, towel under arm for shoulder ER with cane 10 sec x 10  Shoulder flexion step back at table 10 sec x 5  Supine   Modalities: Vaso medium pressure; 34 deg; 10 min  Self Care: Encouraged use of pulley twice daily continued HEP  [facility] Adult PT Treatment:                                                DATE: 02/23/24 Therapeutic Exercise: Standing  Pendulum 30 CW/30 CCW  Sitting   Pulley flexion 10 sec x 10  Pulley scaption 10 sec x 10  Pulley horizontal ab/adduction at ~ 70 deg abduction x 10   Manual Therapy: STM R shoulder girdle patient sitting PROM R shoulder into flexion; scaption; ER at side  Neuromuscular re-ed: Scap squeeze with coregeous ball thoracic spine 5 sec x 10  Therapeutic Activity: Sitting  Shrugs x 10  Scap squeeze with noodle 3 sec x 10  Active shoulder flexion R as possible x 5  Standing  Bent row x 10 per protocol at 4-6 weeks  Standing with noodle, towel under arm for shoulder ER with cane 10 sec x 10  Shoulder flexion step back at table 10 sec x 5  Supine   Modalities: Vaso medium pressure; 34 deg; 10 min  Self Care: Discussed importance of exercise and progression with ROM  Encouraged use of ice at home   PATIENT EDUCATION: Education details: POC; HEP Person educated: Patient Education method: Programmer, multimedia, Demonstration, Actor cues, Verbal cues, and  Handouts Education comprehension: verbalized understanding, returned demonstration, verbal cues required, tactile cues required, and needs further education  HOME EXERCISE PROGRAM: Access Code: [email or web address] URL: [email or web address] Date: 02/25/2024 Prepared by: Jamin Panther  Exercises - Seated Straight Fist AROM  - 2 x daily - 7 x weekly - 1 sets - 5 reps -   hold - Wrist AROM Flexion Extension  - 2 x daily - 7 x weekly - 1 sets - 5 reps - Wrist Circumduction AROM  - 2 x daily - 7 x weekly - 1 sets - 5-10 reps - Seated Forearm Pronation and Supination AROM  - 2 x daily - 7 x weekly - 1 sets - 5 reps - Seated Elbow Flexion and Extension AROM  - 2 x daily - 7 x weekly - 1 sets - 5 reps - Seated Scapular Retraction  - 2 x daily - 7 x weekly - 1-2 sets - 10 reps - 10 sec  hold - Seated Shoulder Shrugs  - 2 x daily - 7 x weekly - 1-2 sets - 10 reps - 2-3 sec  hold - Seated Shoulder Flexion AAROM with Pulley Behind  - 2 x daily - 7 x weekly - 1 sets - 10 reps - 10 sec  hold - Seated Shoulder Scaption AAROM with Pulley at Side  - 2 x daily - 7 x weekly - 1 sets - 10 reps - 10sec  hold - Standing Shoulder and Trunk Flexion at Table  - 2 x daily - 7 x weekly - 1 sets - 5-10 reps - 10 sec  hold - Seated Shoulder External Rotation AAROM with Cane and Hand in Neutral  - 2 x daily - 7 x weekly - 1 sets - 5-10 reps - 5-10 sec  hold - Seated Shoulder Flexion  - 2 x daily - 7 x weekly - 1 sets - 5-10 reps - 2-3 sec  hold - Seated Shoulder Flexion Towel Slide at Table Top Full Range of Motion  - 2 x daily - 7 x weekly - 1 sets - 5-10 reps - 10sec  hold  ASSESSMENT:  CLINICAL IMPRESSION: Patient reports she can be out of the sling when at home. She wears the sling when outside the home. Still sleeping in the recliner but is trying to transition to the bed since she has her new mattress 02/24/24. She reports compliance with HEP and trying to use the R arm for light functional activities at home.  Continued with manual work through the R shoulder girdle with PROM by PT within protocol parameters and tissue limitations. Patient has difficulty relaxing for PROM and stretching as permitted per protocol. Use of vaso post treatment to help prevent soreness and discomfort with treatment.     Eval: Patient is a 73 y.o. female who was seen today for physical therapy evaluation and treatment  s/p R RCR, SAD, DCR 01/06/24 following nontraumatic complete tear of R RC. She presents decreased ROM and strength R UE, with R shoulder pain and decreased function. She has poor posture and alignment; limited cervical and shoulder ROM, mobility, strength, function. She will benefit from PT to address problems identified.   OBJECTIVE IMPAIRMENTS: decreased activity tolerance, decreased mobility, decreased ROM, decreased strength, increased fascial restrictions, increased muscle spasms, improper body mechanics, postural dysfunction, obesity, and pain.   GOALS: Goals reviewed with patient? Yes  SHORT TERM GOALS: Target date: 02/16/2024   Independent in initial HEP Baseline: Goal status: met  2.  Progress with PROM R shoulder per protocol  Baseline:  Goal status: on going   3.  Initiate AROM exercises per protocol  Baseline:  Goal status: on going    LONG TERM GOALS: Target date: 03/22/2024   Increase AROM R shoulder to WFL's as indicated per protocol  Baseline:  Goal status: on going   2.  4/5 to 5/5 strength R shoulder  Baseline:  Goal status: on going   3.  Patient reports use of R UE for functional activities  Baseline:  Goal status: on going   4.  Patient reports ability to sleep without awakening due to R shoulder pain  Baseline:  Goal status: on going   5.  Independent in advanced HEP  Baseline:  Goal status: on going   6.  Improve Quick DASH by 25-30%  Baseline: 84.1/100; 84.1% Goal status: on going   PLAN:  PT FREQUENCY: 1-2x/week  PT DURATION: 10 weeks  PLANNED  INTERVENTIONS: 97164- PT Re-evaluation, 97110-Therapeutic exercises, 97530- Therapeutic activity, 97112- Neuromuscular re-education, 97535- Self Care, [id number]- Manual therapy, 97016- Vasopneumatic device, and Patient/Family education  PLAN FOR NEXT SESSION: shoulder rehab per MD protocol - add active shoulder flexion in standing; rows; isometric IR/ER; work on ROM in supine    [employer], PT 02/25/2024, 11:11 AM   Referring diagnosis? M25.511 Treatment diagnosis? (if different than referring diagnosis) M25.111; R29.3; R29.898; M62.81 What was this (referring dx) caused by? [x]  Surgery []  Fall []  Ongoing issue []  Arthritis []  Other: ____________  Laterality: [x]  Rt []  Lt []  Both  Check all possible CPT codes:  *CHOOSE 10 OR LESS*    See Planned Interventions listed in the Plan section of the Evaluation.

## 2024-02-29 ENCOUNTER — Encounter

## 2024-03-01 ENCOUNTER — Ambulatory Visit: Admitting: Rehabilitative and Restorative Service Providers"

## 2024-03-01 ENCOUNTER — Encounter: Payer: Self-pay | Admitting: Rehabilitative and Restorative Service Providers"

## 2024-03-01 DIAGNOSIS — R29898 Other symptoms and signs involving the musculoskeletal system: Secondary | ICD-10-CM

## 2024-03-01 DIAGNOSIS — M6281 Muscle weakness (generalized): Secondary | ICD-10-CM | POA: Diagnosis not present

## 2024-03-01 DIAGNOSIS — R293 Abnormal posture: Secondary | ICD-10-CM

## 2024-03-01 DIAGNOSIS — M25511 Pain in right shoulder: Secondary | ICD-10-CM

## 2024-03-01 NOTE — Therapy (Signed)
 OUTPATIENT PHYSICAL THERAPY SHOULDER TREATMENT    Patient Name: Ana King MRN: [id number] DOB:April 20, 1951, 73 y.o., female Today's Date: 03/01/2024  END OF SESSION:  PT End of Session - 03/01/24 1442     Visit Number 7    Number of Visits 20    Date for PT Re-Evaluation 03/22/24    Authorization Type human[employer] prior auth required copay $25    Progress Note Due on Visit 10    PT Start Time 1445    PT Stop Time 1535    PT Time Calculation (min) 50 min    Activity Tolerance Patient tolerated treatment well          Past Medical History:  Diagnosis Date   Asthma    Essential hypertension, benign 08/28/2015   GERD (gastroesophageal reflux disease)    Past Surgical History:  Procedure Laterality Date   ABDOMINAL HYSTERECTOMY     Patient Active Problem List   Diagnosis Date Noted   Fall 01/01/2024   Hypotension 09/23/2023   Lung consolidation (HCC) 09/23/2023   Abnormal CT lung screening 09/23/2023   Hot flashes 09/23/2023   Opacity of lung on imaging study 06/29/2023   Pulmonary nodule 06/29/2023   Rotator cuff tear, right 04/22/2023   Excessive daytime sleepiness 03/17/2023   Fatigue 03/17/2023   GAD (generalized anxiety disorder) 03/10/2023   Anxiety attack 03/10/2023   Atypical chest pain 02/10/2023   Chronic obstructive pulmonary disease (HCC) 02/10/2023   Mild intermittent asthma with exacerbation 02/04/2023   Pain of great toe, left 12/12/2022   Gout 09/05/2022   Great toe pain, right 09/03/2022   Pruritus 08/05/2022   Chronic allergic rhinitis 07/08/2022   Migraine without aura and without status migrainosus, not intractable 03/28/2022   Seasonal allergic rhinitis due to pollen 12/24/2021   Seasonal allergies 12/24/2021   Decreased GFR 12/24/2021   Injury of right leg 10/14/2021   Bilateral leg edema 07/30/2021   Left ear impacted cerumen 07/30/2021   Vertigo 07/30/2021   Coronary artery calcification seen on CT scan 04/17/2021   Aortic  atherosclerosis (HCC) 04/17/2021   Centrilobular emphysema (HCC) 04/17/2021   Grade I diastolic dysfunction 02/26/2021   CKD (chronic kidney disease) stage 3, GFR 30-59 ml/min (HCC) 02/20/2021   AKI (acute kidney injury) (HCC) 02/21/2020   Hyponatremia 02/20/2020   Hypocalcemia 02/20/2020   Low hemoglobin 02/20/2020   Herpes zoster with ophthalmic complication 02/03/2020   Acute intractable headache 01/31/2020   Vision changes 01/31/2020   Non-intractable vomiting 01/31/2020   Eye pain, right 01/31/2020   Plantar fasciitis, left 01/24/2020   Diarrhea 11/08/2019   OSA (obstructive sleep apnea) 09/20/2019   Hyperlipidemia 08/18/2019   Non-restorative sleep 08/17/2019   Snoring 08/17/2019   Class 2 obesity due to excess calories without serious comorbidity with body mass index (BMI) of 36.0 to 36.9 in adult 08/17/2019   Cervical spondylosis 05/10/2019   SOB (shortness of breath) on exertion 01/11/2019   Depressed mood 01/11/2019   Osteopenia 03/17/2018   Chronic venous stasis 12/22/2017   Edema of left ankle 12/22/2017   Reactive airway disease 06/09/2017   Former smoker 06/09/2017   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   Contusion 11/13/2015   Essential hypertension, benign 08/28/2015   Hiatal hernia 12/09/2013   Anxiety state 11/02/2013   GERD (gastroesophageal reflux disease) 11/02/2013    PCP: Vermell LITTIE Bologna, PA-C  REFERRING PROVIDER: Dr Elspeth CHRISTELLA Farrow   REFERRING DIAG: R shoulder scope with RCR, SAD, DCR  THERAPY DIAG:  Acute pain of  right shoulder  Abnormal posture  Other symptoms and signs involving the musculoskeletal system  Muscle weakness (generalized)  Rationale for Evaluation and Treatment: Rehabilitation  ONSET DATE: 01/06/24  SUBJECTIVE:                                                                                                                                                                                      SUBJECTIVE STATEMENT: Patient reports  that she now has a pulley to use at home. She has been doing her exercises and trying to use her arm for some functional activities in lower positions. She can't tel that the movement in her shoulder is getting any better. She has a new mattress but has to get used to it but is getting better.   Post MD visit; Patient reports that the doctor is concerned with tightness in the R shoulder.   EVAL: Patient has a history of R rotator cuff tear with no known injury. Symptoms have been present since ~ 02/02/23. Patient underwent R arthroscopic RCR, DCR, SAD 01/06/24. She presents in abduction sling R UE. She has done okay since surgery. She is sleeping in her recliner and is very sedentary at this time. She has an ice machine that does some compression for home and that has helped with the pain. She sleeps with the ice machine and that helps a lot.  Hand dominance: Right  PERTINENT HISTORY: HTN; asthma; COPD; allergies; anxiety; headaches   PAIN:  Are you having pain? Yes: NPRS scale: 2/10; increases with exercises Pain location: mid arm(deltoid area); minimal to no pain in the shoulder joint  Pain description: muscle soreness  Aggravating factors: moving in her sleep; trying the pendulum  Relieving factors: ice machine; pain meds  PRECAUTIONS: Shoulder post op per protocol     WEIGHT BEARING RESTRICTIONS: Yes   FALLS:  Has patient fallen in last 6 months? Yes. 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  DIAGNOSTIC FINDINGS:   X-ray R shoulder: 04/06/24: FINDINGS: The bones are subjectively under mineralized. There is no evidence of fracture or dislocation. The alignment is normal. The joint spaces are normal. There is no evidence of arthropathy or other focal bone abnormality.  Soft tissues are unremarkable.   IMPRESSION: Subjective osteopenia/osteoporosis. Otherwise negative radiographs of the right shoulder  PATIENT SURVEYS:  Quick DASH: 84.1/100; 84.1%      SENSATION: WFL  POSTURE: Patient presents with head forward posture with increased thoracic kyphosis; shoulders rounded and elevated   UPPER EXTREMITY ROM: R shoulder deferred due to surgery at eval     02/16/24: painful and tight end rang motion - patient with difficulty relaxing for PT ROM  Active/Passive ROM Right eval Left Eval AROM Right  PROM 02/16/24  Shoulder flexion  132 91  Shoulder extension  47   Shoulder abduction (scaption)  124 60  Shoulder adduction     Shoulder internal rotation  T10   Shoulder external rotation  49 30  Elbow flexion 100    Elbow extension -40    Wrist flexion WFL's    Wrist extension WFL's    Wrist ulnar deviation     Wrist radial deviation     Forearm pronation WFL's     Forearm supination WFL's    (Blank rows = not tested)  UPPER EXTREMITY MMT: deferred due to surgery    MMT Right eval Left eval  Shoulder flexion    Shoulder extension    Shoulder abduction    Shoulder adduction    Shoulder internal rotation    Shoulder external rotation    Middle trapezius    Lower trapezius    Elbow flexion    Elbow extension    Wrist flexion    Wrist extension    Wrist ulnar deviation    Wrist radial deviation    Wrist pronation    Wrist supination    Grip strength (lbs)    (Blank rows = not tested)  PALPATION:  Tightness R shoulder girdle    [facility] Adult PT Treatment:                                                DATE: 03/01/24 Therapeutic Exercise: Standing  Pendulum 30 CW/30  CCW  Sitting   Pulley flexion 10 sec x 10  Pulley scaption 10 sec x 10  Pulley horizontal ab/adduction at ~ 70 deg abduction x 10   Manual Therapy: STM R shoulder girdle patient supine  PROM R shoulder into flexion 114; scaption 80; ER at side in scapular plane 40 Neuromuscular re-ed: Scap squeeze with noodle thoracic spine 5 sec x 10  Therapeutic Activity: Sitting  Shrugs x 10  Scap squeeze with noodle 7-10 sec x 10  Table slide ~ 5 sec x 10    Standing  Bent row x 10  Scap squeeze with noodle 7-10 sec x 10 Standing with noodle, towel under arm for shoulder ER with cane 10 sec x 10  Shoulder flexion step back at table 10 sec x 5  Active shoulder flexion R as possible x 5 Isometric abduction; ER; IR; extension 5 sec x 10  Supine  AAROM shoulder flexion assisting wth L UE 5 sec x 5   Modalities: Vaso medium pressure; 34 deg; 10 min  Self Care: Encouraged use of pulley twice daily continued HEP       [facility] Adult PT Treatment:  DATE: 02/25/24 Therapeutic Exercise: Standing  Pendulum 30 CW/30 CCW  Sitting   Pulley flexion 10 sec x 10  Pulley scaption 10 sec x 10  Pulley horizontal ab/adduction at ~ 70 deg abduction x 10   Manual Therapy: STM R shoulder girdle patient sitting PROM R shoulder into flexion; scaption; ER at side  Neuromuscular re-ed: Scap squeeze with coregeous ball thoracic spine 5 sec x 10  Therapeutic Activity: Sitting  Shrugs x 10  Scap squeeze with noodle 7-10 sec x 10  Table slide ~ 5 sec x 10  Active shoulder flexion R as possible x 5  Standing  Bent row x 10  Scap squeeze with noodle 7-10 sec x 10 Standing with noodle, towel under arm for shoulder ER with cane 10 sec x 10  Shoulder flexion step back at table 10 sec x 5  Supine   Modalities: Vaso medium pressure; 34 deg; 10 min  Self Care: Encouraged use of pulley twice daily continued HEP                                                                                                                             [facility] Adult PT Treatment:                                                DATE: 02/23/24 Therapeutic Exercise: Standing  Pendulum 30 CW/30 CCW  Sitting   Pulley flexion 10 sec x 10  Pulley scaption 10 sec x 10  Pulley horizontal ab/adduction at ~ 70 deg abduction x 10   Manual Therapy: STM R shoulder girdle patient sitting PROM R shoulder into flexion; scaption; ER at side  Neuromuscular re-ed: Scap squeeze with coregeous ball thoracic spine 5 sec x 10  Therapeutic Activity: Sitting  Shrugs x 10  Scap squeeze with noodle 3 sec x 10  Active shoulder flexion R as possible x 5  Standing  Bent row x 10 per protocol at 4-6 weeks  Standing with noodle, towel under arm for shoulder ER with cane 10 sec x 10  Shoulder flexion step back at table 10 sec x 5  Supine   Modalities: Vaso medium pressure; 34 deg; 10 min  Self Care: Discussed importance of exercise and progression with ROM  Encouraged use of ice at home   PATIENT EDUCATION: Education details: POC; HEP Person educated: Patient Education method: Programmer, multimedia, Demonstration, Actor cues, Verbal cues, and Handouts Education comprehension: verbalized understanding, returned demonstration, verbal cues required, tactile cues required, and needs further education  HOME EXERCISE PROGRAM: Access Code: [email or web address] URL: [email or web address] Date: 03/01/2024 Prepared by: Minami Arriaga  Exercises - Seated Straight Fist AROM  - 2 x daily - 7 x weekly - 1 sets - 5 reps -   hold - Wrist AROM Flexion Extension  - 2 x daily -  7 x weekly - 1 sets - 5 reps - Wrist Circumduction AROM  - 2 x daily - 7 x weekly - 1 sets - 5-10 reps - Seated Forearm Pronation and Supination AROM  - 2 x daily - 7 x weekly - 1 sets - 5 reps - Seated Elbow Flexion and Extension AROM  - 2 x daily - 7 x weekly - 1 sets - 5 reps - Seated Scapular Retraction  - 2 x daily - 7 x weekly - 1-2  sets - 10 reps - 10 sec  hold - Seated Shoulder Shrugs  - 2 x daily - 7 x weekly - 1-2 sets - 10 reps - 2-3 sec  hold - Seated Shoulder Flexion AAROM with Pulley Behind  - 2 x daily - 7 x weekly - 1 sets - 10 reps - 10 sec  hold - Seated Shoulder Scaption AAROM with Pulley at Side  - 2 x daily - 7 x weekly - 1 sets - 10 reps - 10sec  hold - Standing Shoulder and Trunk Flexion at Table  - 2 x daily - 7 x weekly - 1 sets - 5-10 reps - 10 sec  hold - Seated Shoulder External Rotation AAROM with Cane and Hand in Neutral  - 2 x daily - 7 x weekly - 1 sets - 5-10 reps - 5-10 sec  hold - Seated Shoulder Flexion  - 2 x daily - 7 x weekly - 1 sets - 5-10 reps - 2-3 sec  hold - Seated Shoulder Flexion Towel Slide at Table Top Full Range of Motion  - 2 x daily - 7 x weekly - 1 sets - 5-10 reps - 10sec  hold - Supine Shoulder Flexion AAROM with Hands Clasped  - 2 x daily - 7 x weekly - 1 sets - 5-10 reps - 2-3 sec  hold - Isometric Shoulder Abduction at Wall  - 2 x daily - 7 x weekly - 1 sets - 5-10 reps - 5 sec  hold - Isometric Shoulder External Rotation at Wall  - 1 x daily - 7 x weekly - 1 sets - 10 reps - 5 sec  hold - Standing Isometric Shoulder Internal Rotation with Towel Roll at Doorway  - 2 x daily - 7 x weekly - 1 sets - 5 reps - 5 sec  hold - Isometric Shoulder Extension at Wall  - 2 x daily - 7 x weekly - 1 sets - 5-10 reps - 5 sec  hold  ASSESSMENT:  CLINICAL IMPRESSION: Patient now has pulley at home and is using pulley for ROM exercises. Patient reports she can be out of the sling when at home. She wears the sling when outside the home. Still sleeping in the bed since she has her new mattress (02/24/24). She reports compliance with HEP and trying to use the R arm for light functional activities at home. Continued with manual work through the R shoulder girdle with PROM by PT within protocol parameters and tissue limitations. Patient has difficulty relaxing for PROM and stretching as permitted per  protocol. Note increase in PROM today. Added isometric shoulder strengthening today as well as AAROM in shoulder flexion in supine. Use of vaso post treatment to help prevent soreness and discomfort following treatment.     Eval: Patient is a 73 y.o. female who was seen today for physical therapy evaluation and treatment  s/p R RCR, SAD, DCR 01/06/24 following nontraumatic complete tear of R RC. She presents  decreased ROM and strength R UE, with R shoulder pain and decreased function. She has poor posture and alignment; limited cervical and shoulder ROM, mobility, strength, function. She will benefit from PT to address problems identified.   OBJECTIVE IMPAIRMENTS: decreased activity tolerance, decreased mobility, decreased ROM, decreased strength, increased fascial restrictions, increased muscle spasms, improper body mechanics, postural dysfunction, obesity, and pain.   GOALS: Goals reviewed with patient? Yes  SHORT TERM GOALS: Target date: 02/16/2024   Independent in initial HEP Baseline: Goal status: met  2.  Progress with PROM R shoulder per protocol  Baseline:  Goal status: on going   3.  Initiate AROM exercises per protocol  Baseline:  Goal status: on going    LONG TERM GOALS: Target date: 03/22/2024   Increase AROM R shoulder to WFL's as indicated per protocol  Baseline:  Goal status: on going   2.  4/5 to 5/5 strength R shoulder  Baseline:  Goal status: on going   3.  Patient reports use of R UE for functional activities  Baseline:  Goal status: on going   4.  Patient reports ability to sleep without awakening due to R shoulder pain  Baseline:  Goal status: on going   5.  Independent in advanced HEP  Baseline:  Goal status: on going   6.  Improve Quick DASH by 25-30%  Baseline: 84.1/100; 84.1% Goal status: on going   PLAN:  PT FREQUENCY: 1-2x/week  PT DURATION: 10 weeks  PLANNED INTERVENTIONS: 97164- PT Re-evaluation, 97110-Therapeutic exercises, 97530-  Therapeutic activity, 97112- Neuromuscular re-education, 97535- Self Care, [id number]- Manual therapy, 97016- Vasopneumatic device, and Patient/Family education  PLAN FOR NEXT SESSION: shoulder rehab per MD protocol - add active shoulder flexion in standing; rows; isometric IR/ER; work on ROM in supine    [employer], PT 03/01/2024, 3:29 PM   Referring diagnosis? M25.511 Treatment diagnosis? (if different than referring diagnosis) M25.111; R29.3; R29.898; M62.81 What was this (referring dx) caused by? [x]  Surgery []  Fall []  Ongoing issue []  Arthritis []  Other: ____________  Laterality: [x]  Rt []  Lt []  Both  Check all possible CPT codes:  *CHOOSE 10 OR LESS*    See Planned Interventions listed in the Plan section of the Evaluation.

## 2024-03-03 ENCOUNTER — Ambulatory Visit: Payer: Self-pay

## 2024-03-03 DIAGNOSIS — R29898 Other symptoms and signs involving the musculoskeletal system: Secondary | ICD-10-CM | POA: Diagnosis not present

## 2024-03-03 DIAGNOSIS — R293 Abnormal posture: Secondary | ICD-10-CM

## 2024-03-03 DIAGNOSIS — M25511 Pain in right shoulder: Secondary | ICD-10-CM | POA: Diagnosis not present

## 2024-03-03 DIAGNOSIS — M6281 Muscle weakness (generalized): Secondary | ICD-10-CM | POA: Diagnosis not present

## 2024-03-03 NOTE — Therapy (Signed)
 OUTPATIENT PHYSICAL THERAPY SHOULDER TREATMENT    Patient Name: Ana King MRN: [id number] DOB:1951/06/19, 73 y.o., female Today's Date: 03/03/2024  END OF SESSION:  PT End of Session - 03/03/24 1004     Visit Number 8    Number of Visits 20    Date for PT Re-Evaluation 03/22/24    Authorization Type [employer] prior auth required copay $25    Authorization Time Period 20 VISITS APPROVED FOR PT 01/12/2024-03/22/2024    Authorization - Visit Number 8    Authorization - Number of Visits 20    Progress Note Due on Visit 10    PT Start Time 1005    PT Stop Time 1055    PT Time Calculation (min) 50 min    Activity Tolerance Patient tolerated treatment well    Behavior During Therapy WFL for tasks assessed/performed         Past Medical History:  Diagnosis Date   Asthma    Essential hypertension, benign 08/28/2015   GERD (gastroesophageal reflux disease)    Past Surgical History:  Procedure Laterality Date   ABDOMINAL HYSTERECTOMY     Patient Active Problem List   Diagnosis Date Noted   Fall 01/01/2024   Hypotension 09/23/2023   Lung consolidation (HCC) 09/23/2023   Abnormal CT lung screening 09/23/2023   Hot flashes 09/23/2023   Opacity of lung on imaging study 06/29/2023   Pulmonary nodule 06/29/2023   Rotator cuff tear, right 04/22/2023   Excessive daytime sleepiness 03/17/2023   Fatigue 03/17/2023   GAD (generalized anxiety disorder) 03/10/2023   Anxiety attack 03/10/2023   Atypical chest pain 02/10/2023   Chronic obstructive pulmonary disease (HCC) 02/10/2023   Mild intermittent asthma with exacerbation 02/04/2023   Pain of great toe, left 12/12/2022   Gout 09/05/2022   Great toe pain, right 09/03/2022   Pruritus 08/05/2022   Chronic allergic rhinitis 07/08/2022   Migraine without aura and without status migrainosus, not intractable 03/28/2022   Seasonal allergic rhinitis due to pollen 12/24/2021   Seasonal allergies 12/24/2021   Decreased GFR 12/24/2021    Injury of right leg 10/14/2021   Bilateral leg edema 07/30/2021   Left ear impacted cerumen 07/30/2021   Vertigo 07/30/2021   Coronary artery calcification seen on CT scan 04/17/2021   Aortic atherosclerosis (HCC) 04/17/2021   Centrilobular emphysema (HCC) 04/17/2021   Grade I diastolic dysfunction 02/26/2021   CKD (chronic kidney disease) stage 3, GFR 30-59 ml/min (HCC) 02/20/2021   AKI (acute kidney injury) (HCC) 02/21/2020   Hyponatremia 02/20/2020   Hypocalcemia 02/20/2020   Low hemoglobin 02/20/2020   Herpes zoster with ophthalmic complication 02/03/2020   Acute intractable headache 01/31/2020   Vision changes 01/31/2020   Non-intractable vomiting 01/31/2020   Eye pain, right 01/31/2020   Plantar fasciitis, left 01/24/2020   Diarrhea 11/08/2019   OSA (obstructive sleep apnea) 09/20/2019   Hyperlipidemia 08/18/2019   Non-restorative sleep 08/17/2019   Snoring 08/17/2019   Class 2 obesity due to excess calories without serious comorbidity with body mass index (BMI) of 36.0 to 36.9 in adult 08/17/2019   Cervical spondylosis 05/10/2019   SOB (shortness of breath) on exertion 01/11/2019   Depressed mood 01/11/2019   Osteopenia 03/17/2018   Chronic venous stasis 12/22/2017   Edema of left ankle 12/22/2017   Reactive airway disease 06/09/2017   Former smoker 06/09/2017   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   Contusion 11/13/2015   Essential hypertension, benign 08/28/2015   Hiatal hernia 12/09/2013   Anxiety state 11/02/2013  GERD (gastroesophageal reflux disease) 11/02/2013    PCP: Vermell LITTIE Bologna, PA-C  REFERRING PROVIDER: Dr Elspeth CHRISTELLA Farrow   REFERRING DIAG: R shoulder scope with RCR, SAD, DCR  THERAPY DIAG:  Acute pain of right shoulder  Abnormal posture  Rationale for Evaluation and Treatment: Rehabilitation  ONSET DATE: 01/06/24  SUBJECTIVE:                                                                                                                                                                                       SUBJECTIVE STATEMENT: Patient reports her shoulder is feeling looser, states she has no pain  Post MD visit; Patient reports that the doctor is concerned with tightness in the R shoulder.   EVAL: Patient has a history of R rotator cuff tear with no known injury. Symptoms have been present since ~ 02/02/23. Patient underwent R arthroscopic RCR, DCR, SAD 01/06/24. She presents in abduction sling R UE. She has done okay since surgery. She is sleeping in her recliner and is very sedentary at this time. She has an ice machine that does some compression for home and that has helped with the pain. She sleeps with the ice machine and that helps a lot.  Hand dominance: Right  PERTINENT HISTORY: HTN; asthma; COPD; allergies; anxiety; headaches   PAIN:  Are you having pain? Yes: NPRS scale: 2/10; increases with exercises Pain location: mid arm(deltoid area); minimal to no pain in the shoulder joint  Pain description: muscle soreness  Aggravating factors: moving in her sleep; trying the pendulum  Relieving factors: ice machine; pain meds  PRECAUTIONS: Shoulder post op per protocol     WEIGHT BEARING RESTRICTIONS: Yes   FALLS:  Has patient fallen in last 6 months? Yes. 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  DIAGNOSTIC FINDINGS:  X-ray R shoulder: 04/06/24: FINDINGS: The bones are subjectively under mineralized. There is no evidence of fracture or dislocation. The alignment  is normal. The joint spaces are normal. There is no evidence of arthropathy or other focal bone abnormality. Soft tissues are unremarkable.   IMPRESSION: Subjective osteopenia/osteoporosis. Otherwise negative radiographs of the right shoulder  PATIENT SURVEYS:  Quick DASH: 84.1/100; 84.1%  SENSATION: WFL  POSTURE: Patient presents with head forward posture with increased thoracic kyphosis; shoulders rounded and elevated   UPPER EXTREMITY ROM: R shoulder deferred due to surgery at eval     02/16/24: painful and tight end rang motion - patient with difficulty relaxing for PT ROM  Active/Passive ROM Right eval Left Eval AROM Right  PROM 02/16/24  Shoulder flexion  132 91  Shoulder extension  47   Shoulder abduction (scaption)  124 60  Shoulder adduction     Shoulder internal rotation  T10   Shoulder external rotation  49 30  Elbow flexion 100    Elbow extension -40    Wrist flexion WFL's    Wrist extension WFL's    Wrist ulnar deviation     Wrist radial deviation     Forearm pronation WFL's     Forearm supination WFL's    (Blank rows = not tested)  UPPER EXTREMITY MMT: deferred due to surgery    MMT Right eval Left eval  Shoulder flexion    Shoulder extension    Shoulder abduction    Shoulder adduction    Shoulder internal rotation    Shoulder external rotation    Middle trapezius    Lower trapezius    Elbow flexion    Elbow extension    Wrist flexion    Wrist extension    Wrist ulnar deviation    Wrist radial deviation    Wrist pronation    Wrist supination    Grip strength (lbs)    (Blank rows = not tested)  PALPATION:  Tightness R shoulder girdle     [facility] Adult PT Treatment:                                                DATE: 03/03/2024 Therapeutic Exercise: Standing pendulum 30 CW/30 CCW Pulley flexion 10 sec x 10  Pulley scaption 10 sec x 10  Pulley horizontal ab/adduction at ~ 70 deg abduction x 10  Manual Therapy: STM R shoulder  girdle patient supine  PROM R shoulder into flexion 114; scaption 80; ER at side in scapular plane 40 Therapeutic Activity: Standing:  Bent row x 10 Scap squeeze with noodle 10x10 Standing with noodle, towel under arm for shoulder ER with cane 10x10 Shoulder flexion step back at counter 10x5  Supine AAROM shoulder flexion to tolerance 3x5 Modalities: Vaso medium pressure; 34 deg; 10 min     [facility] Adult PT Treatment:                                                DATE: 03/01/24 Therapeutic Exercise: Standing  Pendulum 30 CW/30 CCW  Sitting   Pulley flexion 10 sec x 10  Pulley scaption 10 sec x 10  Pulley horizontal ab/adduction at ~ 70 deg abduction x 10  Manual Therapy: STM R shoulder girdle patient supine  PROM R shoulder into flexion 114; scaption 80; ER at side in scapular plane 40 Neuromuscular re-ed: Scap squeeze with noodle thoracic spine 5 sec x 10  Therapeutic Activity: Sitting  Shrugs x 10  Scap squeeze with noodle 7-10 sec x 10  Table slide ~ 5 sec x 10    Standing  Bent  row x 10  Scap squeeze with noodle 7-10 sec x 10 Standing with noodle, towel under arm for shoulder ER with cane 10 sec x 10  Shoulder flexion step back at table 10 sec x 5  Active shoulder flexion R as possible x 5 Isometric abduction; ER; IR; extension 5 sec x 10  Supine  AAROM shoulder flexion assisting wth L UE 5 sec x 5   Modalities: Vaso medium pressure; 34 deg; 10 min  Self Care: Encouraged use of pulley twice daily continued HEP       [facility] Adult PT Treatment:                                                DATE: 02/25/24 Therapeutic Exercise: Standing  Pendulum 30 CW/30 CCW  Sitting   Pulley flexion 10 sec x 10  Pulley scaption 10 sec x 10  Pulley horizontal ab/adduction at ~ 70 deg abduction x 10   Manual Therapy: STM R shoulder girdle patient sitting PROM R shoulder into flexion; scaption; ER at side  Neuromuscular re-ed: Scap squeeze with coregeous ball thoracic  spine 5 sec x 10  Therapeutic Activity: Sitting  Shrugs x 10  Scap squeeze with noodle 7-10 sec x 10  Table slide ~ 5 sec x 10  Active shoulder flexion R as possible x 5  Standing  Bent row x 10  Scap squeeze with noodle 7-10 sec x 10 Standing with noodle, towel under arm for shoulder ER with cane 10 sec x 10  Shoulder flexion step back at table 10 sec x 5  Supine   Modalities: Vaso medium pressure; 34 deg; 10 min  Self Care: Encouraged use of pulley twice daily continued HEP                                                                                                                             PATIENT EDUCATION: Education details: POC; HEP Person educated: Patient Education method: Programmer, multimedia, Facilities manager, Actor cues, Verbal cues, and Handouts Education comprehension: verbalized understanding, returned demonstration, verbal cues required, tactile cues required, and needs further education  HOME EXERCISE PROGRAM: Access Code: [email or web address] URL: [email or web address] Date: 03/01/2024 Prepared by: Celyn Holt  Exercises - Seated Straight Fist AROM  - 2 x daily - 7 x weekly - 1 sets - 5 reps -   hold - Wrist AROM Flexion Extension  - 2 x daily - 7 x weekly - 1 sets - 5 reps - Wrist Circumduction AROM  - 2 x daily - 7 x weekly - 1 sets - 5-10 reps - Seated Forearm Pronation and Supination AROM  - 2 x daily - 7 x weekly - 1 sets - 5 reps - Seated Elbow Flexion and Extension AROM  - 2 x daily - 7 x weekly - 1 sets -  5 reps - Seated Scapular Retraction  - 2 x daily - 7 x weekly - 1-2 sets - 10 reps - 10 sec  hold - Seated Shoulder Shrugs  - 2 x daily - 7 x weekly - 1-2 sets - 10 reps - 2-3 sec  hold - Seated Shoulder Flexion AAROM with Pulley Behind  - 2 x daily - 7 x weekly - 1 sets - 10 reps - 10 sec  hold - Seated Shoulder Scaption AAROM with Pulley at Side  - 2 x daily - 7 x weekly - 1 sets - 10 reps - 10sec  hold - Standing Shoulder and Trunk Flexion at Table   - 2 x daily - 7 x weekly - 1 sets - 5-10 reps - 10 sec  hold - Seated Shoulder External Rotation AAROM with Cane and Hand in Neutral  - 2 x daily - 7 x weekly - 1 sets - 5-10 reps - 5-10 sec  hold - Seated Shoulder Flexion  - 2 x daily - 7 x weekly - 1 sets - 5-10 reps - 2-3 sec  hold - Seated Shoulder Flexion Towel Slide at Table Top Full Range of Motion  - 2 x daily - 7 x weekly - 1 sets - 5-10 reps - 10sec  hold - Supine Shoulder Flexion AAROM with Hands Clasped  - 2 x daily - 7 x weekly - 1 sets - 5-10 reps - 2-3 sec  hold - Isometric Shoulder Abduction at Wall  - 2 x daily - 7 x weekly - 1 sets - 5-10 reps - 5 sec  hold - Isometric Shoulder External Rotation at Wall  - 1 x daily - 7 x weekly - 1 sets - 10 reps - 5 sec  hold - Standing Isometric Shoulder Internal Rotation with Towel Roll at Doorway  - 2 x daily - 7 x weekly - 1 sets - 5 reps - 5 sec  hold - Isometric Shoulder Extension at Wall  - 2 x daily - 7 x weekly - 1 sets - 5-10 reps - 5 sec  hold  ASSESSMENT:  CLINICAL IMPRESSION: Noted muscle guarding during passive shoulder flexion ROM and patient required frequent verbal and tactile cueing to promote relaxation. Stabilizing scapula improved shoulder PROM however patient continues to have pain along anterior upper arm. Shoulder AAROM exercises continued per protocol guidelines.  Eval: Patient is a 73 y.o. female who was seen today for physical therapy evaluation and treatment  s/p R RCR, SAD, DCR 01/06/24 following nontraumatic complete tear of R RC. She presents decreased ROM and strength R UE, with R shoulder pain and decreased function. She has poor posture and alignment; limited cervical and shoulder ROM, mobility, strength, function. She will benefit from PT to address problems identified.   OBJECTIVE IMPAIRMENTS: decreased activity tolerance, decreased mobility, decreased ROM, decreased strength, increased fascial restrictions, increased muscle spasms, improper body mechanics,  postural dysfunction, obesity, and pain.   GOALS: Goals reviewed with patient? Yes  SHORT TERM GOALS: Target date: 02/16/2024  Independent in initial HEP Baseline: Goal status: met  2.  Progress with PROM R shoulder per protocol  Baseline:  Goal status: on going   3.  Initiate AROM exercises per protocol  Baseline:  Goal status: on going    LONG TERM GOALS: Target date: 03/22/2024  Increase AROM R shoulder to WFL's as indicated per protocol  Baseline:  Goal status: on going   2.  4/5 to 5/5 strength R shoulder  Baseline:  Goal status: on going   3.  Patient reports use of R UE for functional activities  Baseline:  Goal status: on going   4.  Patient reports ability to sleep without awakening due to R shoulder pain  Baseline:  Goal status: on going   5.  Independent in advanced HEP  Baseline:  Goal status: on going   6.  Improve Quick DASH by 25-30%  Baseline: 84.1/100; 84.1% Goal status: on going   PLAN:  PT FREQUENCY: 1-2x/week  PT DURATION: 10 weeks  PLANNED INTERVENTIONS: 97164- PT Re-evaluation, 97110-Therapeutic exercises, 97530- Therapeutic activity, 97112- Neuromuscular re-education, 97535- Self Care, [id number]- Manual therapy, 97016- Vasopneumatic device, and Patient/Family education  PLAN FOR NEXT SESSION: shoulder rehab per MD protocol - add active shoulder flexion in standing; rows; isometric IR/ER; work on ROM in supine    Lamarr GORMAN Price, PTA 03/03/2024, 10:52 AM

## 2024-03-07 DIAGNOSIS — J4550 Severe persistent asthma, uncomplicated: Secondary | ICD-10-CM | POA: Diagnosis not present

## 2024-03-07 DIAGNOSIS — J449 Chronic obstructive pulmonary disease, unspecified: Secondary | ICD-10-CM | POA: Diagnosis not present

## 2024-03-07 DIAGNOSIS — J302 Other seasonal allergic rhinitis: Secondary | ICD-10-CM | POA: Diagnosis not present

## 2024-03-08 ENCOUNTER — Ambulatory Visit: Payer: Self-pay | Admitting: Rehabilitative and Restorative Service Providers"

## 2024-03-08 ENCOUNTER — Encounter: Payer: Self-pay | Admitting: Rehabilitative and Restorative Service Providers"

## 2024-03-08 DIAGNOSIS — R293 Abnormal posture: Secondary | ICD-10-CM

## 2024-03-08 DIAGNOSIS — M25511 Pain in right shoulder: Secondary | ICD-10-CM | POA: Diagnosis not present

## 2024-03-08 DIAGNOSIS — M6281 Muscle weakness (generalized): Secondary | ICD-10-CM

## 2024-03-08 DIAGNOSIS — R29898 Other symptoms and signs involving the musculoskeletal system: Secondary | ICD-10-CM | POA: Diagnosis not present

## 2024-03-08 NOTE — Therapy (Signed)
 OUTPATIENT PHYSICAL THERAPY SHOULDER TREATMENT    Patient Name: Ana King MRN: [id number] DOB:1951/01/10, 73 y.o., female Today's Date: 03/08/2024  END OF SESSION:  PT End of Session - 03/08/24 1312     Visit Number 9    Number of Visits 20    Date for PT Re-Evaluation 03/22/24    Authorization Type humana prior auth required copay $25    Authorization Time Period 20 VISITS APPROVED FOR PT 01/12/2024-03/22/2024    Authorization - Visit Number 9    Authorization - Number of Visits 20    Progress Note Due on Visit 10    PT Start Time 1313    PT Stop Time 1405    PT Time Calculation (min) 52 min         Past Medical History:  Diagnosis Date   Asthma    Essential hypertension, benign 08/28/2015   GERD (gastroesophageal reflux disease)    Past Surgical History:  Procedure Laterality Date   ABDOMINAL HYSTERECTOMY     Patient Active Problem List   Diagnosis Date Noted   Fall 01/01/2024   Hypotension 09/23/2023   Lung consolidation (HCC) 09/23/2023   Abnormal CT lung screening 09/23/2023   Hot flashes 09/23/2023   Opacity of lung on imaging study 06/29/2023   Pulmonary nodule 06/29/2023   Rotator cuff tear, right 04/22/2023   Excessive daytime sleepiness 03/17/2023   Fatigue 03/17/2023   GAD (generalized anxiety disorder) 03/10/2023   Anxiety attack 03/10/2023   Atypical chest pain 02/10/2023   Chronic obstructive pulmonary disease (HCC) 02/10/2023   Mild intermittent asthma with exacerbation 02/04/2023   Pain of great toe, left 12/12/2022   Gout 09/05/2022   Great toe pain, right 09/03/2022   Pruritus 08/05/2022   Chronic allergic rhinitis 07/08/2022   Migraine without aura and without status migrainosus, not intractable 03/28/2022   Seasonal allergic rhinitis due to pollen 12/24/2021   Seasonal allergies 12/24/2021   Decreased GFR 12/24/2021   Injury of right leg 10/14/2021   Bilateral leg edema 07/30/2021   Left ear impacted cerumen 07/30/2021   Vertigo  07/30/2021   Coronary artery calcification seen on CT scan 04/17/2021   Aortic atherosclerosis (HCC) 04/17/2021   Centrilobular emphysema (HCC) 04/17/2021   Grade I diastolic dysfunction 02/26/2021   CKD (chronic kidney disease) stage 3, GFR 30-59 ml/min (HCC) 02/20/2021   AKI (acute kidney injury) (HCC) 02/21/2020   Hyponatremia 02/20/2020   Hypocalcemia 02/20/2020   Low hemoglobin 02/20/2020   Herpes zoster with ophthalmic complication 02/03/2020   Acute intractable headache 01/31/2020   Vision changes 01/31/2020   Non-intractable vomiting 01/31/2020   Eye pain, right 01/31/2020   Plantar fasciitis, left 01/24/2020   Diarrhea 11/08/2019   OSA (obstructive sleep apnea) 09/20/2019   Hyperlipidemia 08/18/2019   Non-restorative sleep 08/17/2019   Snoring 08/17/2019   Class 2 obesity due to excess calories without serious comorbidity with body mass index (BMI) of 36.0 to 36.9 in adult 08/17/2019   Cervical spondylosis 05/10/2019   SOB (shortness of breath) on exertion 01/11/2019   Depressed mood 01/11/2019   Osteopenia 03/17/2018   Chronic venous stasis 12/22/2017   Edema of left ankle 12/22/2017   Reactive airway disease 06/09/2017   Former smoker 06/09/2017   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   Contusion 11/13/2015   Essential hypertension, benign 08/28/2015   Hiatal hernia 12/09/2013   Anxiety state 11/02/2013   GERD (gastroesophageal reflux disease) 11/02/2013    PCP: Vermell LITTIE Bologna, PA-C  REFERRING PROVIDER: Dr Elspeth HERO  [place]   REFERRING DIAG: R shoulder scope with RCR, SAD, DCR  THERAPY DIAG:  Acute pain of right shoulder  Abnormal posture  Other symptoms and signs involving the musculoskeletal system  Muscle weakness (generalized)  Rationale for Evaluation and Treatment: Rehabilitation  ONSET DATE: 01/06/24  SUBJECTIVE:                                                                                                                                                                                       SUBJECTIVE STATEMENT: Patient reports her shoulder is hurting some - has had pain since last PT visit. Hurts in the arm below the shoulder. Hurts when she tries to lift light weight things.   Post MD visit; Patient reports that the doctor is concerned with tightness in the R shoulder.   EVAL: Patient has a history of R rotator cuff tear with no known injury. Symptoms have been present since ~ 02/02/23. Patient underwent R arthroscopic RCR, DCR, SAD 01/06/24. She presents in abduction sling R UE. She has done okay since surgery. She is sleeping in her recliner and is very sedentary at this time. She has an ice machine that does some compression for home and that has helped with the pain. She sleeps with the ice machine and that helps a lot.  Hand dominance: Right  PERTINENT HISTORY: HTN; asthma; COPD; allergies; anxiety; headaches   PAIN:  Are you having pain? Yes: NPRS scale: 5/10; increases with exercises Pain location: mid arm(deltoid area); minimal to no pain in the shoulder joint  Pain description: muscle soreness  Aggravating factors: moving in her sleep; trying the pendulum  Relieving factors: ice machine; pain meds  PRECAUTIONS: Shoulder post op per protocol     WEIGHT BEARING RESTRICTIONS: Yes   FALLS:  Has patient fallen in last 6 months? Yes. 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  DIAGNOSTIC FINDINGS:  X-ray R shoulder: 04/06/24: FINDINGS: The bones are subjectively  under mineralized. There is no evidence of fracture or dislocation. The alignment is normal. The joint spaces are normal. There is no evidence of arthropathy or other focal bone abnormality. Soft tissues are unremarkable.   IMPRESSION: Subjective osteopenia/osteoporosis. Otherwise  negative radiographs of the right shoulder  PATIENT SURVEYS:  Quick DASH: 84.1/100; 84.1%      SENSATION: WFL  POSTURE: Patient presents with head forward posture with increased thoracic kyphosis; shoulders rounded and elevated   UPPER EXTREMITY ROM: R shoulder deferred due to surgery at eval     02/16/24: painful and tight end rang motion - patient with difficulty relaxing for PT ROM  Active/Passive ROM Right eval Left Eval AROM Right  PROM 02/16/24  Shoulder flexion  132 91  Shoulder extension  47   Shoulder abduction (scaption)  124 60  Shoulder adduction     Shoulder internal rotation  T10   Shoulder external rotation  49 30  Elbow flexion 100    Elbow extension -40    Wrist flexion WFL's    Wrist extension WFL's    Wrist ulnar deviation     Wrist radial deviation     Forearm pronation WFL's     Forearm supination WFL's    (Blank rows = not tested)  UPPER EXTREMITY MMT: deferred due to surgery    MMT Right eval Left eval  Shoulder flexion    Shoulder extension    Shoulder abduction    Shoulder adduction    Shoulder internal rotation    Shoulder external rotation    Middle trapezius    Lower trapezius    Elbow flexion    Elbow extension    Wrist flexion    Wrist extension    Wrist ulnar deviation    Wrist radial deviation    Wrist pronation    Wrist supination    Grip strength (lbs)    (Blank rows = not tested)  PALPATION:  Tightness R shoulder girdle     [facility] Adult PT Treatment:                                                DATE: 03/08/2024 Therapeutic Exercise: Standing pendulum 30 CW/30 CCW Pulley flexion 10 sec x 10  Pulley scaption 10 sec x 10  Pulley  horizontal ab/adduction at ~ 70 deg abduction x 10  Manual Therapy: STM R shoulder girdle patient supine  PROM R shoulder into flexion 120; scaption 90; ER at side in scapular plane 40 Therapeutic Activity: Standing:  Bent row x 10 Scap squeeze with noodle 10x10 Standing with noodle, towel under arm for shoulder ER with cane 10x10 Shoulder flexion step back at counter  10 sec x 5  Supine AAROM shoulder flexion to tolerance, patient assisting w/ L LE x 3 Isometric step back row red TB 5 sec x 5 reps  Modalities: Vaso medium pressure; 34 deg; 10 min    [facility] Adult PT Treatment:                                                DATE: 03/03/2024 Therapeutic Exercise: Standing pendulum 30 CW/30 CCW Pulley flexion 10 sec x 10  Pulley scaption 10 sec x 10  Pulley horizontal ab/adduction at ~ 70 deg abduction x 10  Manual Therapy: STM R shoulder girdle patient supine  PROM R shoulder into flexion 114; scaption 80; ER at side in scapular plane 40 Therapeutic Activity: Standing:  Bent row x 10  Scap squeeze with noodle 10x10 Standing with noodle, towel under arm for shoulder ER with cane 10x10 Shoulder flexion step back at counter 10x5  Supine AAROM shoulder flexion to tolerance 3x5 Modalities: Vaso medium pressure; 34 deg; 10 min    [facility]  Standing Adult PT Treatment:                                                DATE: 03/01/24 Therapeutic Exercise: Pendulum 30 CW/30 CCW  Sitting   Pulley flexion 10 sec x 10  Pulley scaption 10 sec x 10  Pulley horizontal ab/adduction at ~ 70 deg abduction x 10  Manual Therapy: STM R shoulder girdle patient supine  PROM R shoulder into flexion 114; scaption 80; ER at side in scapular plane 40 Neuromuscular re-ed: Scap squeeze with noodle thoracic spine 5 sec x 10  Therapeutic Activity: Sitting  Shrugs x 10  Scap squeeze with noodle 7-10 sec x 10  Table slide ~ 5 sec x 10    Standing  Bent row x 10  Scap squeeze with noodle 7-10 sec x  10 Standing with noodle, towel under arm for shoulder ER with cane 10 sec x 10  Shoulder flexion step back at table 10 sec x 5  Active shoulder flexion R as possible x 5 Isometric abduction; ER; IR; extension 5 sec x 10  Supine  AAROM shoulder flexion assisting wth L UE 5 sec x 5   Modalities: Vaso medium pressure; 34 deg; 10 min  Self Care: Encouraged use of pulley twice daily continued HEP                                                                                                                     PATIENT EDUCATION: Education details: POC; HEP Person educated: Patient Education method: Programmer, multimedia, Facilities manager, Actor cues, Verbal cues, and Handouts Education comprehension: verbalized understanding, returned demonstration, verbal cues required, tactile cues required, and needs further education  HOME EXERCISE PROGRAM: Access Code: [email or web address] URL: [email or web address] Date: 03/01/2024 Prepared by: Renita Brocks  Exercises - Seated Straight Fist AROM  - 2 x daily - 7 x weekly - 1 sets - 5 reps -   hold - Wrist AROM Flexion Extension  - 2 x daily - 7 x weekly - 1 sets - 5 reps - Wrist Circumduction AROM  - 2 x daily - 7 x weekly - 1 sets - 5-10 reps - Seated Forearm Pronation and Supination AROM  - 2 x daily - 7 x weekly - 1 sets - 5 reps - Seated Elbow Flexion and Extension AROM  - 2 x daily - 7 x weekly - 1 sets - 5 reps - Seated Scapular Retraction  - 2 x daily - 7 x weekly - 1-2 sets - 10 reps - 10 sec  hold - Seated Shoulder Shrugs  - 2 x daily -  7 x weekly - 1-2 sets - 10 reps - 2-3 sec  hold - Seated Shoulder Flexion AAROM with Pulley Behind  - 2 x daily - 7 x weekly - 1 sets - 10 reps - 10 sec  hold - Seated Shoulder Scaption AAROM with Pulley at Side  - 2 x daily - 7 x weekly - 1 sets - 10 reps - 10sec  hold - Standing Shoulder and Trunk Flexion at Table  - 2 x daily - 7 x weekly - 1 sets - 5-10 reps - 10 sec  hold - Seated Shoulder External Rotation  AAROM with Cane and Hand in Neutral  - 2 x daily - 7 x weekly - 1 sets - 5-10 reps - 5-10 sec  hold - Seated Shoulder Flexion  - 2 x daily - 7 x weekly - 1 sets - 5-10 reps - 2-3 sec  hold - Seated Shoulder Flexion Towel Slide at Table Top Full Range of Motion  - 2 x daily - 7 x weekly - 1 sets - 5-10 reps - 10sec  hold - Supine Shoulder Flexion AAROM with Hands Clasped  - 2 x daily - 7 x weekly - 1 sets - 5-10 reps - 2-3 sec  hold - Isometric Shoulder Abduction at Wall  - 2 x daily - 7 x weekly - 1 sets - 5-10 reps - 5 sec  hold - Isometric Shoulder External Rotation at Wall  - 1 x daily - 7 x weekly - 1 sets - 10 reps - 5 sec  hold - Standing Isometric Shoulder Internal Rotation with Towel Roll at Doorway  - 2 x daily - 7 x weekly - 1 sets - 5 reps - 5 sec  hold - Isometric Shoulder Extension at Wall  - 2 x daily - 7 x weekly - 1 sets - 5-10 reps - 5 sec  hold  ASSESSMENT:  CLINICAL IMPRESSION: Patient reports increased pain in the R shoulder area this week. She has palpable tightness through the upper trap and biceps area.  Patient continues to have difficulty relaxing for PROM. Muscle guarding decreased with verbal and tactile cueing to promote relaxation. Note tightness and pain with palpation anterior upper arm, deltoid, pecs. Good response to manual work and gentle PROM/relaxation techniques.  Eval: Patient is a 73 y.o. female who was seen today for physical therapy evaluation and treatment  s/p R RCR, SAD, DCR 01/06/24 following nontraumatic complete tear of R RC. She presents decreased ROM and strength R UE, with R shoulder pain and decreased function. She has poor posture and alignment; limited cervical and shoulder ROM, mobility, strength, function. She will benefit from PT to address problems identified.   OBJECTIVE IMPAIRMENTS: decreased activity tolerance, decreased mobility, decreased ROM, decreased strength, increased fascial restrictions, increased muscle spasms, improper body  mechanics, postural dysfunction, obesity, and pain.   GOALS: Goals reviewed with patient? Yes  SHORT TERM GOALS: Target date: 02/16/2024  Independent in initial HEP Baseline: Goal status: met  2.  Progress with PROM R shoulder per protocol  Baseline:  Goal status: on going   3.  Initiate AROM exercises per protocol  Baseline:  Goal status: on going    LONG TERM GOALS: Target date: 03/22/2024  Increase AROM R shoulder to WFL's as indicated per protocol  Baseline:  Goal status: on going   2.  4/5 to 5/5 strength R shoulder  Baseline:  Goal status: on going   3.  Patient reports use of R  UE for functional activities  Baseline:  Goal status: on going   4.  Patient reports ability to sleep without awakening due to R shoulder pain  Baseline:  Goal status: on going   5.  Independent in advanced HEP  Baseline:  Goal status: on going   6.  Improve Quick DASH by 25-30%  Baseline: 84.1/100; 84.1% Goal status: on going   PLAN:  PT FREQUENCY: 1-2x/week  PT DURATION: 10 weeks  PLANNED INTERVENTIONS: 97164- PT Re-evaluation, 97110-Therapeutic exercises, 97530- Therapeutic activity, 97112- Neuromuscular re-education, 97535- Self Care, 02859- Manual therapy, 97016- Vasopneumatic device, and Patient/Family education  PLAN FOR NEXT SESSION: shoulder rehab per MD protocol - add active shoulder flexion in standing; rows; isometric IR/ER; work on ROM in supine    [employer], PT 03/08/2024, 2:00 PM  [facility] Out[facility] at [facility] [address] [place], [place], [address] Phone: [phone]   Fax:  [phone]  Patient Details  Name: Eldean Klatt MRN: [id number] Date of Birth: 1951/05/30 Referring Provider:  Lane Elspeth HERO, MD  Encounter Date: 03/08/2024   Florene SHAUNNA Baptist, PT 03/08/2024, 2:00 PM   [facility] Out[facility] at [facility] [address]  [address] [place], [place], [address] Phone: [phone]   Fax:  [phone]

## 2024-03-09 NOTE — Therapy (Signed)
 OUTPATIENT PHYSICAL THERAPY SHOULDER TREATMENT    Patient Name: Ana King MRN: [id number] DOB:04/14/1951, 73 y.o., female Today's Date: 03/09/2024  END OF SESSION:   Past Medical History:  Diagnosis Date   Asthma    Essential hypertension, benign 08/28/2015   GERD (gastroesophageal reflux disease)    Past Surgical History:  Procedure Laterality Date   ABDOMINAL HYSTERECTOMY     Patient Active Problem List   Diagnosis Date Noted   Fall 01/01/2024   Hypotension 09/23/2023   Lung consolidation (HCC) 09/23/2023   Abnormal CT lung screening 09/23/2023   Hot flashes 09/23/2023   Opacity of lung on imaging study 06/29/2023   Pulmonary nodule 06/29/2023   Rotator cuff tear, right 04/22/2023   Excessive daytime sleepiness 03/17/2023   Fatigue 03/17/2023   GAD (generalized anxiety disorder) 03/10/2023   Anxiety attack 03/10/2023   Atypical chest pain 02/10/2023   Chronic obstructive pulmonary disease (HCC) 02/10/2023   Mild intermittent asthma with exacerbation 02/04/2023   Pain of great toe, left 12/12/2022   Gout 09/05/2022   Great toe pain, right 09/03/2022   Pruritus 08/05/2022   Chronic allergic rhinitis 07/08/2022   Migraine without aura and without status migrainosus, not intractable 03/28/2022   Seasonal allergic rhinitis due to pollen 12/24/2021   Seasonal allergies 12/24/2021   Decreased GFR 12/24/2021   Injury of right leg 10/14/2021   Bilateral leg edema 07/30/2021   Left ear impacted cerumen 07/30/2021   Vertigo 07/30/2021   Coronary artery calcification seen on CT scan 04/17/2021   Aortic atherosclerosis (HCC) 04/17/2021   Centrilobular emphysema (HCC) 04/17/2021   Grade I diastolic dysfunction 02/26/2021   CKD (chronic kidney disease) stage 3, GFR 30-59 ml/min (HCC) 02/20/2021   AKI (acute kidney injury) (HCC) 02/21/2020   Hyponatremia 02/20/2020   Hypocalcemia 02/20/2020   Low hemoglobin 02/20/2020   Herpes zoster with ophthalmic complication  02/03/2020   Acute intractable headache 01/31/2020   Vision changes 01/31/2020   Non-intractable vomiting 01/31/2020   Eye pain, right 01/31/2020   Plantar fasciitis, left 01/24/2020   Diarrhea 11/08/2019   OSA (obstructive sleep apnea) 09/20/2019   Hyperlipidemia 08/18/2019   Non-restorative sleep 08/17/2019   Snoring 08/17/2019   Class 2 obesity due to excess calories without serious comorbidity with body mass index (BMI) of 36.0 to 36.9 in adult 08/17/2019   Cervical spondylosis 05/10/2019   SOB (shortness of breath) on exertion 01/11/2019   Depressed mood 01/11/2019   Osteopenia 03/17/2018   Chronic venous stasis 12/22/2017   Edema of left ankle 12/22/2017   Reactive airway disease 06/09/2017   Former smoker 06/09/2017   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   Contusion 11/13/2015   Essential hypertension, benign 08/28/2015   Hiatal hernia 12/09/2013   Anxiety state 11/02/2013   GERD (gastroesophageal reflux disease) 11/02/2013    PCP: Vermell LITTIE Bologna, PA-C  REFERRING PROVIDER: Dr Elspeth CHRISTELLA Farrow   REFERRING DIAG: R shoulder scope with RCR, SAD, DCR  THERAPY DIAG:  No diagnosis found.  Rationale for Evaluation and Treatment: Rehabilitation  ONSET DATE: 01/06/24  SUBJECTIVE:  SUBJECTIVE STATEMENT: 03/09/2024: ***  *** Patient reports her shoulder is hurting some - has had pain since last PT visit. Hurts in the arm below the shoulder. Hurts when she tries to lift light weight things.   Post MD visit; Patient reports that the doctor is concerned with tightness in the R shoulder.   EVAL: Patient has a history of R rotator cuff tear with no known injury. Symptoms have been present since ~ 02/02/23. Patient underwent R arthroscopic RCR, DCR, SAD 01/06/24. She presents in abduction sling R UE. She has done  okay since surgery. She is sleeping in her recliner and is very sedentary at this time. She has an ice machine that does some compression for home and that has helped with the pain. She sleeps with the ice machine and that helps a lot.  Hand dominance: Right  PERTINENT HISTORY: HTN; asthma; COPD; allergies; anxiety; headaches   PAIN:  Are you having pain? Yes: NPRS scale: 5/10; increases with exercises Pain location: mid arm(deltoid area); minimal to no pain in the shoulder joint  Pain description: muscle soreness  Aggravating factors: moving in her sleep; trying the pendulum  Relieving factors: ice machine; pain meds  PRECAUTIONS: Shoulder post op per protocol     WEIGHT BEARING RESTRICTIONS: Yes   FALLS:  Has patient fallen in last 6 months? Yes. 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  DIAGNOSTIC FINDINGS:  X-ray R shoulder: 04/06/24: FINDINGS: The bones are subjectively under mineralized. There is no evidence of fracture or dislocation. The alignment is normal. The joint spaces are normal. There is no evidence of arthropathy or other focal bone abnormality. Soft tissues are unremarkable.   IMPRESSION: Subjective osteopenia/osteoporosis. Otherwise negative radiographs of the right shoulder  PATIENT SURVEYS:  Quick DASH: 84.1/100; 84.1%      SENSATION: WFL  POSTURE: Patient presents with head forward posture with increased thoracic kyphosis; shoulders rounded and elevated   UPPER EXTREMITY ROM: R  shoulder deferred due to surgery at eval     02/16/24: painful and tight end rang motion - patient with difficulty relaxing for PT ROM  Active/Passive ROM Right eval Left Eval AROM Right  PROM 02/16/24  Shoulder flexion  132 91  Shoulder extension  47   Shoulder abduction (scaption)  124 60  Shoulder adduction     Shoulder internal rotation  T10   Shoulder external rotation  49 30  Elbow flexion 100    Elbow extension -40    Wrist flexion WFL's    Wrist extension WFL's    Wrist ulnar deviation     Wrist radial deviation     Forearm pronation WFL's     Forearm supination WFL's    (Blank rows = not tested)  UPPER EXTREMITY MMT: deferred due to surgery    MMT Right eval Left eval  Shoulder flexion    Shoulder extension    Shoulder abduction    Shoulder adduction    Shoulder internal rotation    Shoulder external rotation    Middle  trapezius    Lower trapezius    Elbow flexion    Elbow extension    Wrist flexion    Wrist extension    Wrist ulnar deviation    Wrist radial deviation    Wrist pronation    Wrist supination    Grip strength (lbs)    (Blank rows = not tested)  PALPATION:  Tightness R shoulder girdle     [facility] Adult PT Treatment:                                                DATE: 03/10/24 Therapeutic Exercise: *** Manual Therapy: *** Neuromuscular re-ed: *** Therapeutic Activity: *** Modalities: *** Self Care: ***    [facility] Adult PT Treatment:                                                DATE: 03/08/2024 Therapeutic Exercise: Standing pendulum 30 CW/30 CCW Pulley flexion 10 sec x 10  Pulley scaption 10 sec x 10  Pulley horizontal ab/adduction at ~ 70 deg abduction x 10  Manual Therapy: STM R shoulder girdle patient supine  PROM R shoulder into flexion 120; scaption 90; ER at side in scapular plane 40 Therapeutic Activity: Standing:  Bent row x 10 Scap squeeze with noodle 10x10 Standing with noodle, towel under arm for shoulder ER  with cane 10x10 Shoulder flexion step back at counter  10 sec x 5  Supine AAROM shoulder flexion to tolerance, patient assisting w/ L LE x 3 Isometric step back row red TB 5 sec x 5 reps  Modalities: Vaso medium pressure; 34 deg; 10 min    [facility] Adult PT Treatment:                                                DATE: 03/03/2024 Therapeutic Exercise: Standing pendulum 30 CW/30 CCW Pulley flexion 10 sec x 10  Pulley scaption 10 sec x 10  Pulley horizontal ab/adduction at ~ 70 deg abduction x 10  Manual Therapy: STM R shoulder girdle patient supine  PROM R shoulder into flexion 114; scaption 80; ER at side in scapular plane 40 Therapeutic Activity: Standing:  Bent row x 10 Scap squeeze with noodle 10x10 Standing with noodle, towel under arm for shoulder ER with cane 10x10 Shoulder flexion step back at counter 10x5  Supine AAROM shoulder flexion to tolerance 3x5 Modalities: Vaso medium pressure; 34 deg; 10 min    [facility]  Standing Adult PT Treatment:                                                DATE: 03/01/24 Therapeutic Exercise: Pendulum 30 CW/30 CCW  Sitting   Pulley flexion 10 sec x 10  Pulley scaption 10 sec x 10  Pulley horizontal ab/adduction at ~ 70 deg abduction x 10  Manual Therapy: STM R shoulder girdle patient supine  PROM R shoulder into flexion 114; scaption 80; ER at side in  scapular plane 40 Neuromuscular re-ed: Scap squeeze with noodle thoracic spine 5 sec x 10  Therapeutic Activity: Sitting  Shrugs x 10  Scap squeeze with noodle 7-10 sec x 10  Table slide ~ 5 sec x 10    Standing  Bent row x 10  Scap squeeze with noodle 7-10 sec x 10 Standing with noodle, towel under arm for shoulder ER with cane 10 sec x 10  Shoulder flexion step back at table 10 sec x 5  Active shoulder flexion R as possible x 5 Isometric abduction; ER; IR; extension 5 sec x 10  Supine  AAROM shoulder flexion assisting wth L UE 5 sec x 5   Modalities: Vaso medium pressure; 34  deg; 10 min  Self Care: Encouraged use of pulley twice daily continued HEP                                                                                                                     PATIENT EDUCATION: Education details: POC; HEP Person educated: Patient Education method: Programmer, multimedia, Facilities manager, Actor cues, Verbal cues, and Handouts Education comprehension: verbalized understanding, returned demonstration, verbal cues required, tactile cues required, and needs further education  HOME EXERCISE PROGRAM: Access Code: [email or web address] URL: [email or web address] Date: 03/01/2024 Prepared by: Celyn Holt  Exercises - Seated Straight Fist AROM  - 2 x daily - 7 x weekly - 1 sets - 5 reps -   hold - Wrist AROM Flexion Extension  - 2 x daily - 7 x weekly - 1 sets - 5 reps - Wrist Circumduction AROM  - 2 x daily - 7 x weekly - 1 sets - 5-10 reps - Seated Forearm Pronation and Supination AROM  - 2 x daily - 7 x weekly - 1 sets - 5 reps - Seated Elbow Flexion and Extension AROM  - 2 x daily - 7 x weekly - 1 sets - 5 reps - Seated Scapular Retraction  - 2 x daily - 7 x weekly - 1-2 sets - 10 reps - 10 sec  hold - Seated Shoulder Shrugs  - 2 x daily - 7 x weekly - 1-2 sets - 10 reps - 2-3 sec  hold - Seated Shoulder Flexion AAROM with Pulley Behind  - 2 x daily - 7 x weekly - 1 sets - 10 reps - 10 sec  hold - Seated Shoulder Scaption AAROM with Pulley at Side  - 2 x daily - 7 x weekly - 1 sets - 10 reps - 10sec  hold - Standing Shoulder and Trunk Flexion at Table  - 2 x daily - 7 x weekly - 1 sets - 5-10 reps - 10 sec  hold - Seated Shoulder External Rotation AAROM with Cane and Hand in Neutral  - 2 x daily - 7 x weekly - 1 sets - 5-10 reps - 5-10 sec  hold - Seated Shoulder Flexion  - 2 x daily - 7 x weekly - 1 sets - 5-10 reps - 2-3 sec  hold - Seated Shoulder Flexion Towel Slide at Table Top Full Range of Motion  - 2 x daily - 7 x weekly - 1 sets - 5-10 reps - 10sec  hold -  Supine Shoulder Flexion AAROM with Hands Clasped  - 2 x daily - 7 x weekly - 1 sets - 5-10 reps - 2-3 sec  hold - Isometric Shoulder Abduction at Wall  - 2 x daily - 7 x weekly - 1 sets - 5-10 reps - 5 sec  hold - Isometric Shoulder External Rotation at Wall  - 1 x daily - 7 x weekly - 1 sets - 10 reps - 5 sec  hold - Standing Isometric Shoulder Internal Rotation with Towel Roll at Doorway  - 2 x daily - 7 x weekly - 1 sets - 5 reps - 5 sec  hold - Isometric Shoulder Extension at Wall  - 2 x daily - 7 x weekly - 1 sets - 5-10 reps - 5 sec  hold  ASSESSMENT:  CLINICAL IMPRESSION: 03/09/2024: ***  *** Patient reports increased pain in the R shoulder area this week. She has palpable tightness through the upper trap and biceps area.  Patient continues to have difficulty relaxing for PROM. Muscle guarding decreased with verbal and tactile cueing to promote relaxation. Note tightness and pain with palpation anterior upper arm, deltoid, pecs. Good response to manual work and gentle PROM/relaxation techniques.  Eval: Patient is a 73 y.o. female who was seen today for physical therapy evaluation and treatment  s/p R RCR, SAD, DCR 01/06/24 following nontraumatic complete tear of R RC. She presents decreased ROM and strength R UE, with R shoulder pain and decreased function. She has poor posture and alignment; limited cervical and shoulder ROM, mobility, strength, function. She will benefit from PT to address problems identified.   OBJECTIVE IMPAIRMENTS: decreased activity tolerance, decreased mobility, decreased ROM, decreased strength, increased fascial restrictions, increased muscle spasms, improper body mechanics, postural dysfunction, obesity, and pain.   GOALS: Goals reviewed with patient? Yes  SHORT TERM GOALS: Target date: 02/16/2024  Independent in initial HEP Baseline: Goal status: met  2.  Progress with PROM R shoulder per protocol  Baseline:  Goal status: on going   3.  Initiate AROM  exercises per protocol  Baseline:  Goal status: on going    LONG TERM GOALS: Target date: 03/22/2024  Increase AROM R shoulder to WFL's as indicated per protocol  Baseline:  Goal status: on going   2.  4/5 to 5/5 strength R shoulder  Baseline:  Goal status: on going   3.  Patient reports use of R UE for functional activities  Baseline:  Goal status: on going   4.  Patient reports ability to sleep without awakening due to R shoulder pain  Baseline:  Goal status: on going   5.  Independent in advanced HEP  Baseline:  Goal status: on going   6.  Improve Quick DASH by 25-30%  Baseline: 84.1/100; 84.1% Goal status: on going   PLAN:  PT FREQUENCY: 1-2x/week  PT DURATION: 10 weeks  PLANNED INTERVENTIONS: 97164- PT Re-evaluation, 97110-Therapeutic exercises, 97530- Therapeutic activity, 97112- Neuromuscular re-education, 97535- Self Care, [id number]- Manual therapy, 97016- Vasopneumatic device, and Patient/Family education  PLAN FOR NEXT SESSION: shoulder rehab per MD protocol - add active shoulder flexion in standing; rows; isometric IR/ER; work on ROM in supine    Alm DELENA Jenny PT, DPT 03/09/2024 4:52 PM   [facility] at  [facility] [address] [place], [place], [address] Phone: [phone]   Fax:  [phone]  Patient Details  Name: Ezell Melikian MRN: [id number] Date of Birth: 14-Jul-1951 Referring Provider:  Lane Elspeth HERO, MD  Encounter Date: 03/10/2024  [facility] at [facility] [address] [place], [place], [address] Phone: [phone]   Fax:  [phone]

## 2024-03-10 ENCOUNTER — Encounter: Payer: Self-pay | Admitting: Physical Therapy

## 2024-03-10 ENCOUNTER — Ambulatory Visit: Payer: Self-pay | Admitting: Physical Therapy

## 2024-03-10 DIAGNOSIS — R29898 Other symptoms and signs involving the musculoskeletal system: Secondary | ICD-10-CM

## 2024-03-10 DIAGNOSIS — R293 Abnormal posture: Secondary | ICD-10-CM | POA: Diagnosis not present

## 2024-03-10 DIAGNOSIS — M6281 Muscle weakness (generalized): Secondary | ICD-10-CM | POA: Diagnosis not present

## 2024-03-10 DIAGNOSIS — M25511 Pain in right shoulder: Secondary | ICD-10-CM | POA: Diagnosis not present

## 2024-03-11 ENCOUNTER — Ambulatory Visit: Admitting: Physician Assistant

## 2024-03-12 ENCOUNTER — Other Ambulatory Visit: Payer: Self-pay | Admitting: Physician Assistant

## 2024-03-12 DIAGNOSIS — I700 Atherosclerosis of aorta: Secondary | ICD-10-CM

## 2024-03-12 DIAGNOSIS — R0789 Other chest pain: Secondary | ICD-10-CM

## 2024-03-12 DIAGNOSIS — E782 Mixed hyperlipidemia: Secondary | ICD-10-CM

## 2024-03-15 ENCOUNTER — Encounter: Payer: Self-pay | Admitting: Rehabilitative and Restorative Service Providers"

## 2024-03-15 ENCOUNTER — Ambulatory Visit: Payer: Self-pay | Admitting: Rehabilitative and Restorative Service Providers"

## 2024-03-15 DIAGNOSIS — R293 Abnormal posture: Secondary | ICD-10-CM

## 2024-03-15 DIAGNOSIS — R29898 Other symptoms and signs involving the musculoskeletal system: Secondary | ICD-10-CM | POA: Diagnosis not present

## 2024-03-15 DIAGNOSIS — M6281 Muscle weakness (generalized): Secondary | ICD-10-CM

## 2024-03-15 DIAGNOSIS — M25511 Pain in right shoulder: Secondary | ICD-10-CM | POA: Diagnosis not present

## 2024-03-15 NOTE — Therapy (Signed)
 OUTPATIENT PHYSICAL THERAPY SHOULDER TREATMENT   Patient Name: Ana King MRN: [id number] DOB:[birth year], 73 y.o., female Today's Date: 03/15/2024    END OF SESSION:  PT End of Session - 03/15/24 1407     Visit Number 11    Number of Visits 22    Date for PT Re-Evaluation 04/21/24    Authorization Type human[employer] prior auth required copay $25    Authorization Time Period 20 VISITS APPROVED FOR PT 01/12/2024-03/22/2024    Authorization - Visit Number 11    Authorization - Number of Visits 20    Progress Note Due on Visit 20    PT Start Time 1400    PT Stop Time 1450    PT Time Calculation (min) 50 min    Activity Tolerance Patient tolerated treatment well          Past Medical History:  Diagnosis Date   Asthma    Essential hypertension, benign 08/28/2015   GERD (gastroesophageal reflux disease)    Past Surgical History:  Procedure Laterality Date   ABDOMINAL HYSTERECTOMY     Patient Active Problem List   Diagnosis Date Noted   Fall 01/01/2024   Hypotension 09/23/2023   Lung consolidation (HCC) 09/23/2023   Abnormal CT lung screening 09/23/2023   Hot flashes 09/23/2023   Opacity of lung on imaging study 06/29/2023   Pulmonary nodule 06/29/2023   Rotator cuff tear, right 04/22/2023   Excessive daytime sleepiness 03/17/2023   Fatigue 03/17/2023   GAD (generalized anxiety disorder) 03/10/2023   Anxiety attack 03/10/2023   Atypical chest pain 02/10/2023   Chronic obstructive pulmonary disease (HCC) 02/10/2023   Mild intermittent asthma with exacerbation 02/04/2023   Pain of great toe, left 12/12/2022   Gout 09/05/2022   Great toe pain, right 09/03/2022   Pruritus 08/05/2022   Chronic allergic rhinitis 07/08/2022   Migraine without aura and without status migrainosus, not intractable 03/28/2022   Seasonal allergic rhinitis due to pollen 12/24/2021   Seasonal allergies 12/24/2021   Decreased GFR 12/24/2021   Injury of right leg 10/14/2021   Bilateral leg edema  07/30/2021   Left ear impacted cerumen 07/30/2021   Vertigo 07/30/2021   Coronary artery calcification seen on CT scan 04/17/2021   Aortic atherosclerosis (HCC) 04/17/2021   Centrilobular emphysema (HCC) 04/17/2021   Grade I diastolic dysfunction 02/26/2021   CKD (chronic kidney disease) stage 3, GFR 30-59 ml/min (HCC) 02/20/2021   AKI (acute kidney injury) (HCC) 02/21/2020   Hyponatremia 02/20/2020   Hypocalcemia 02/20/2020   Low hemoglobin 02/20/2020   Herpes zoster with ophthalmic complication 02/03/2020   Acute intractable headache 01/31/2020   Vision changes 01/31/2020   Non-intractable vomiting 01/31/2020   Eye pain, right 01/31/2020   Plantar fasciitis, left 01/24/2020   Diarrhea 11/08/2019   OSA (obstructive sleep apnea) 09/20/2019   Hyperlipidemia 08/18/2019   Non-restorative sleep 08/17/2019   Snoring 08/17/2019   Class 2 obesity due to excess calories without serious comorbidity with body mass index (BMI) of 36.0 to 36.9 in adult 08/17/2019   Cervical spondylosis 05/10/2019   SOB (shortness of breath) on exertion 01/11/2019   Depressed mood 01/11/2019   Osteopenia 03/17/2018   Chronic venous stasis 12/22/2017   Edema of left ankle 12/22/2017   Reactive airway disease 06/09/2017   Former smoker 06/09/2017   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   Contusion 11/13/2015   Essential hypertension, benign 08/28/2015   Hiatal hernia 12/09/2013   Anxiety state 11/02/2013   GERD (gastroesophageal reflux disease) 11/02/2013  PCP: Vermell LITTIE Bologna, PA-C  REFERRING PROVIDER: Dr Elspeth CHRISTELLA Farrow   REFERRING DIAG: R shoulder scope with RCR, SAD, DCR  THERAPY DIAG:  Acute pain of right shoulder  Abnormal posture  Other symptoms and signs involving the musculoskeletal system  Muscle weakness (generalized)  Rationale for Evaluation and Treatment: Rehabilitation  ONSET DATE: 01/06/24  SUBJECTIVE:                                                                                                                                                                                       SUBJECTIVE STATEMENT: 03/15/2024: Increased pain in the past couple of days. Has used arm for more functional activities in her home in the kitchen and things like making her bed.  Has noticed more popping. Wearing sling when outside the home but not at home. She is working on her exercises at home. States pain continues to be a problem, 6/10 at worst in past week. Will have some pain superiorly at the head of the humerus and down her arm. Pendulum feels good and decreases pain.  Post MD visit; Patient reports that the doctor is concerned with tightness in the R shoulder.   EVAL: Patient has a history of R rotator cuff tear with no known injury. Symptoms have been present since ~ 02/02/23. Patient underwent R arthroscopic RCR, DCR, SAD 01/06/24. She presents in abduction sling R UE. She has done okay since surgery. She is sleeping in her recliner and is very sedentary at this time. She has an ice machine that does some compression for home and that has helped with the pain. She sleeps with the ice machine and that helps a lot.  Hand dominance: Right  PERTINENT HISTORY: HTN; asthma; COPD; allergies; anxiety; headaches   PAIN:  Are you having pain? Yes: NPRS scale: 6/10; increases with exercises Pain location: mid arm(deltoid area); minimal to no pain in the shoulder joint  Pain description: muscle soreness  Aggravating factors: moving in her sleep; trying the pendulum  Relieving factors: ice machine; pain meds  PRECAUTIONS: Shoulder post op per protocol     WEIGHT BEARING RESTRICTIONS: Yes   FALLS:  Has patient fallen in last 6 months? Yes. 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  DIAGNOSTIC FINDINGS:  X-ray R shoulder: 04/06/24: FINDINGS: The bones are subjectively under mineralized. There is no evidence of fracture or dislocation. The alignment is normal. The joint spaces are normal. There is no evidence of arthropathy or other focal bone abnormality. Soft tissues are unremarkable.   IMPRESSION: Subjective osteopenia/osteoporosis. Otherwise negative radiographs of the right shoulder  PATIENT SURVEYS:  Quick DASH: 84.1/100; 84.1%  03/10/24 quickdash: 52.3%      SENSATION: WFL  POSTURE: Patient presents with head forward posture with increased thoracic kyphosis; shoulders rounded and elevated   UPPER EXTREMITY ROM: R shoulder deferred due to surgery at eval     02/16/24: painful and tight end rang motion - patient with difficulty relaxing for PT ROM  Active/Passive ROM Right eval Left Eval AROM Right  PROM 02/16/24 R PROM 03/10/24  Shoulder flexion  132 91 102 deg AA (PROM limited by muscle guarding)  A: pt self demonstrates <50 deg, not formally measured  Shoulder extension  47    Shoulder abduction (scaption)  124 60 60 deg PROM ; limited by muscle guarding  Shoulder adduction      Shoulder internal rotation  T10    Shoulder external rotation  49 30 35 deg P/AAROM  Elbow flexion 100     Elbow extension -40     Wrist flexion WFL's     Wrist extension WFL's     Wrist ulnar deviation      Wrist radial deviation      Forearm pronation WFL's      Forearm supination WFL's     (Blank rows = not tested)  UPPER EXTREMITY MMT: deferred due to surgery    MMT Right eval Left eval  Shoulder flexion    Shoulder extension    Shoulder abduction    Shoulder adduction     Shoulder internal rotation    Shoulder external rotation    Middle trapezius    Lower trapezius    Elbow flexion    Elbow extension    Wrist flexion    Wrist extension    Wrist ulnar deviation    Wrist radial deviation    Wrist pronation    Wrist supination    Grip strength (lbs)    (Blank rows = not tested) Comments: 03/10/24 deferred given postop protocol  PALPATION:  Tightness R shoulder girdle    [facility] Adult PT Treatment:                                                DATE: 03/16/2024 Therapeutic Exercise: Standing pendulum 30 CW/30 CCW Pulley flexion 10 sec x 10  Pulley scaption 10 sec x 10  Pulley horizontal ab/adduction at ~ 70 deg abduction x 10  Manual Therapy: STM R shoulder girdle patient supine  PROM R shoulder into flexion 122; scaption 90; ER at side in scapular plane 40 Taping - trial of unloading the deltoid with anterior to posterior bias. 3 strops of kinesotape - good support noted with taping  Therapeutic Activity: Standing:  Bent row x 10 Scap squeeze with noodle 10x10 Standing with noodle, towel under arm for shoulder ER with cane 10x10 Shoulder flexion step back at counter  10 sec x 5  Supine AAROM shoulder flexion to tolerance, patient assisting w/ L LE x 3  Isometric step back row red TB 5 sec x 5 reps  Isometric ER step out red TB 3 sec x 10  Isometric IR step out red TB 3 sec x 10  Modalities: Vaso medium pressure; 34 deg; 10 min   [facility] Adult PT Treatment:                                                DATE: 03/10/24 Therapeutic Exercise: Hands clasped supine shoulder flexion x8 Bent row, unresisted, RUE only x10 cues for scapular retraction Reclined hands clasped shoulder flexion, small arc (<75deg) 2x6  Pulley scaption 2 min cues for comfortable ROM HEP discussion/education  Manual Therapy: passive physiological movement  R GHJ to pt tolerance all planes, gentle oscillations for muscle guarding, verbal cues for muscle  guarding  Therapeutic Activity: MSK assessment + education QuickDASH + education Education/discussion re: progress with PT, symptom behavior as it affects activity tolerance, PT goals/POC   Modalities: Vaso R shoulder; seated w/ pillow; low compression 34 deg    [facility] Adult PT Treatment:                                                DATE: 03/08/2024 Therapeutic Exercise: Standing pendulum 30 CW/30 CCW Pulley flexion 10 sec x 10  Pulley scaption 10 sec x 10  Pulley horizontal ab/adduction at ~ 70 deg abduction x 10  Manual Therapy: STM R shoulder girdle patient supine  PROM R shoulder into flexion 120; scaption 90; ER at side in scapular plane 40 Therapeutic Activity: Standing:  Bent row x 10 Scap squeeze with noodle 10x10 Standing with noodle, towel under arm for shoulder ER with cane 10x10 Shoulder flexion step back at counter  10 sec x 5  Supine AAROM shoulder flexion to tolerance, patient assisting w/ L LE x 3 Isometric step back row red TB 5 sec x 5 reps  Modalities: Vaso medium pressure; 34 deg; 10 min                                                                                                                 PATIENT EDUCATION: Education details: POC; HEP Person educated: Patient Education method: Programmer, multimedia, Demonstration, Actor cues, Verbal cues, and Handouts Education comprehension: verbalized understanding, returned demonstration, verbal cues required, tactile cues required, and needs further education  HOME EXERCISE PROGRAM: Access Code: [email or web address] URL: [email or web address] Date: 03/15/2024 Prepared by: Mattie Novosel  Exercises - Seated Straight Fist AROM  - 2 x daily - 7 x weekly - 1 sets - 5 reps -   hold - Wrist AROM Flexion Extension  - 2 x daily - 7 x weekly - 1 sets - 5 reps - Wrist Circumduction AROM  - 2 x daily - 7 x weekly -  1 sets - 5-10 reps - Seated Forearm Pronation and Supination AROM  - 2 x daily - 7 x weekly - 1 sets - 5  reps - Seated Elbow Flexion and Extension AROM  - 2 x daily - 7 x weekly - 1 sets - 5 reps - Seated Scapular Retraction  - 2 x daily - 7 x weekly - 1-2 sets - 10 reps - 10 sec  hold - Seated Shoulder Shrugs  - 2 x daily - 7 x weekly - 1-2 sets - 10 reps - 2-3 sec  hold - Seated Shoulder Flexion AAROM with Pulley Behind  - 2 x daily - 7 x weekly - 1 sets - 10 reps - 10 sec  hold - Seated Shoulder Scaption AAROM with Pulley at Side  - 2 x daily - 7 x weekly - 1 sets - 10 reps - 10sec  hold - Standing Shoulder and Trunk Flexion at Table  - 2 x daily - 7 x weekly - 1 sets - 5-10 reps - 10 sec  hold - Seated Shoulder External Rotation AAROM with Cane and Hand in Neutral  - 2 x daily - 7 x weekly - 1 sets - 5-10 reps - 5-10 sec  hold - Seated Shoulder Flexion  - 2 x daily - 7 x weekly - 1 sets - 5-10 reps - 2-3 sec  hold - Seated Shoulder Flexion Towel Slide at Table Top Full Range of Motion  - 2 x daily - 7 x weekly - 1 sets - 5-10 reps - 10sec  hold - Supine Shoulder Flexion AAROM with Hands Clasped  - 2 x daily - 7 x weekly - 1 sets - 5-10 reps - 2-3 sec  hold - Isometric Shoulder Abduction at Wall  - 2 x daily - 7 x weekly - 1 sets - 5-10 reps - 5 sec  hold - Isometric Shoulder External Rotation at Wall  - 1 x daily - 7 x weekly - 1 sets - 10 reps - 5 sec  hold - Standing Isometric Shoulder Internal Rotation with Towel Roll at Doorway  - 2 x daily - 7 x weekly - 1 sets - 5 reps - 5 sec  hold - Isometric Shoulder Extension at Wall  - 2 x daily - 7 x weekly - 1 sets - 5-10 reps - 5 sec  hold - External Rotation Reactive Isometrics with Flex Bar  - 2 x daily - 7 x weekly - 1-2 sets - 10 reps - 3-5 sec  hold - Shoulder Internal Rotation Reactive Isometrics  - 2 x daily - 7 x weekly - 1 sets - 10 reps - 3-5 sec  hold  ASSESSMENT:  CLINICAL IMPRESSION: 03/15/2024: Pt ~ 10 weeks post op with report of increased pain in the past couple of days. 6/10 initially with decrease of pain to 2/10 pain post  treatment. Continued with PROM; AAROM; light resistive exercises. Protocol allows PROM/AAROM/AROM within tolerance (goal of full ROM week 10-12). Patient remains limited by pain and muscle guarding. She has difficulty relaxing for manual work and PROM exhibiting increased muscle guarding with PROM. Patient states that she is using R UE to perform ADLs and household tasks which are forward in nature but she is not lifting with R UE. Increased pain likely related to forward activities. Educated patient re-forward nature of most ADL's and sitting/sleeping postures and encouraged consistent postural awareness engaging posterior shoulder girdle to avoid anterior position of GH joint and scapulae.  Trial of taping to unload the deltoid and act as a reminder to avoid forward reaching activities.     Eval: Patient is a 73 y.o. female who was seen today for physical therapy evaluation and treatment  s/p R RCR, SAD, DCR 01/06/24 following nontraumatic complete tear of R RC. She presents decreased ROM and strength R UE, with R shoulder pain and decreased function. She has poor posture and alignment; limited cervical and shoulder ROM, mobility, strength, function. She will benefit from PT to address problems identified.   OBJECTIVE IMPAIRMENTS: decreased activity tolerance, decreased mobility, decreased ROM, decreased strength, increased fascial restrictions, increased muscle spasms, improper body mechanics, postural dysfunction, obesity, and pain.   GOALS: Goals reviewed with patient? Yes  SHORT TERM GOALS: Target date: 02/16/2024  Independent in initial HEP Baseline: Goal status: met  2.  Progress with PROM R shoulder per protocol  Baseline:  03/10/24: see ROM chart above Goal status: on going   3.  Initiate AROM exercises per protocol  Baseline:  03/10/24: not formally assessed given pain levels w/ PROM/AAROM, pt self-demonstrates <40 deg Goal status: on going    LONG TERM GOALS: Target date: 04/21/2024  (updated 03/10/24)  Increase AROM R shoulder to WFL's as indicated per protocol  Baseline:  03/10/24: not assessed given pain levels w/ PROM/AAROM Goal status: on going   2.  4/5 to 5/5 strength R shoulder  Baseline:  03/10/24: not assessed given post op timeline Goal status: on going   3.  Patient reports use of R UE for functional activities  Baseline:  03/10/24: limited by pain and post op protocol Goal status: on going   4.  Patient reports ability to sleep without awakening due to R shoulder pain  Baseline:  03/10/24: able to sleep without waking due to pain Goal status: MET   5.  Independent in advanced HEP  Baseline:  03/10/24: reports good adherence w/ current HEP but does have some pain Goal status: on going   6.  Improve Quick DASH by 25-30%  Baseline: 84.1/100; 84.1% 03/10/24: 52.3% Goal status: on going   PLAN: (updated 03/10/24)  PT FREQUENCY: 1-2x/week  PT DURATION: 6 weeks  PLANNED INTERVENTIONS: 97164- PT Re-evaluation, 97110-Therapeutic exercises, 97530- Therapeutic activity, 97112- Neuromuscular re-education, 97535- Self Care, [id number]- Manual therapy, 97016- Vasopneumatic device, and Patient/Family education  PLAN FOR NEXT SESSION: shoulder rehab per MD protocol - add active shoulder flexion in standing; rows; isometric IR/ER; work on ROM in supine   Harve Spradley P. Ina PT, MPH 03/15/24 2:57 PM

## 2024-03-17 ENCOUNTER — Encounter: Payer: Self-pay | Admitting: Rehabilitative and Restorative Service Providers"

## 2024-03-17 ENCOUNTER — Ambulatory Visit: Payer: Self-pay | Admitting: Rehabilitative and Restorative Service Providers"

## 2024-03-17 DIAGNOSIS — R293 Abnormal posture: Secondary | ICD-10-CM | POA: Diagnosis not present

## 2024-03-17 DIAGNOSIS — R29898 Other symptoms and signs involving the musculoskeletal system: Secondary | ICD-10-CM

## 2024-03-17 DIAGNOSIS — M6281 Muscle weakness (generalized): Secondary | ICD-10-CM

## 2024-03-17 DIAGNOSIS — M25511 Pain in right shoulder: Secondary | ICD-10-CM | POA: Diagnosis not present

## 2024-03-17 NOTE — Therapy (Signed)
 OUTPATIENT PHYSICAL THERAPY SHOULDER TREATMENT   Patient Name: Ana King MRN: [id number] DOB:[birth year], 73 y.o., female Today's Date: 03/17/2024    END OF SESSION:  PT End of Session - 03/17/24 1059     Visit Number 12    Number of Visits 22    Date for PT Re-Evaluation 04/21/24    Authorization Type human[employer] prior auth required copay $25    Authorization Time Period 20 VISITS APPROVED FOR PT 01/12/2024-03/22/2024    Authorization - Visit Number 12    Authorization - Number of Visits 20    Progress Note Due on Visit 20    PT Start Time 1058    PT Stop Time 1146    PT Time Calculation (min) 48 min          Past Medical History:  Diagnosis Date   Asthma    Essential hypertension, benign 08/28/2015   GERD (gastroesophageal reflux disease)    Past Surgical History:  Procedure Laterality Date   ABDOMINAL HYSTERECTOMY     Patient Active Problem List   Diagnosis Date Noted   Fall 01/01/2024   Hypotension 09/23/2023   Lung consolidation (HCC) 09/23/2023   Abnormal CT lung screening 09/23/2023   Hot flashes 09/23/2023   Opacity of lung on imaging study 06/29/2023   Pulmonary nodule 06/29/2023   Rotator cuff tear, right 04/22/2023   Excessive daytime sleepiness 03/17/2023   Fatigue 03/17/2023   GAD (generalized anxiety disorder) 03/10/2023   Anxiety attack 03/10/2023   Atypical chest pain 02/10/2023   Chronic obstructive pulmonary disease (HCC) 02/10/2023   Mild intermittent asthma with exacerbation 02/04/2023   Pain of great toe, left 12/12/2022   Gout 09/05/2022   Great toe pain, right 09/03/2022   Pruritus 08/05/2022   Chronic allergic rhinitis 07/08/2022   Migraine without aura and without status migrainosus, not intractable 03/28/2022   Seasonal allergic rhinitis due to pollen 12/24/2021   Seasonal allergies 12/24/2021   Decreased GFR 12/24/2021   Injury of right leg 10/14/2021   Bilateral leg edema 07/30/2021   Left ear impacted cerumen 07/30/2021    Vertigo 07/30/2021   Coronary artery calcification seen on CT scan 04/17/2021   Aortic atherosclerosis (HCC) 04/17/2021   Centrilobular emphysema (HCC) 04/17/2021   Grade I diastolic dysfunction 02/26/2021   CKD (chronic kidney disease) stage 3, GFR 30-59 ml/min (HCC) 02/20/2021   AKI (acute kidney injury) (HCC) 02/21/2020   Hyponatremia 02/20/2020   Hypocalcemia 02/20/2020   Low hemoglobin 02/20/2020   Herpes zoster with ophthalmic complication 02/03/2020   Acute intractable headache 01/31/2020   Vision changes 01/31/2020   Non-intractable vomiting 01/31/2020   Eye pain, right 01/31/2020   Plantar fasciitis, left 01/24/2020   Diarrhea 11/08/2019   OSA (obstructive sleep apnea) 09/20/2019   Hyperlipidemia 08/18/2019   Non-restorative sleep 08/17/2019   Snoring 08/17/2019   Class 2 obesity due to excess calories without serious comorbidity with body mass index (BMI) of 36.0 to 36.9 in adult 08/17/2019   Cervical spondylosis 05/10/2019   SOB (shortness of breath) on exertion 01/11/2019   Depressed mood 01/11/2019   Osteopenia 03/17/2018   Chronic venous stasis 12/22/2017   Edema of left ankle 12/22/2017   Reactive airway disease 06/09/2017   Former smoker 06/09/2017   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   Contusion 11/13/2015   Essential hypertension, benign 08/28/2015   Hiatal hernia 12/09/2013   Anxiety state 11/02/2013   GERD (gastroesophageal reflux disease) 11/02/2013    PCP: Vermell LITTIE Bologna, PA-C  REFERRING PROVIDER: Dr  Elspeth CHRISTELLA Farrow   REFERRING DIAG: R shoulder scope with RCR, SAD, DCR  THERAPY DIAG:  Acute pain of right shoulder  Abnormal posture  Other symptoms and signs involving the musculoskeletal system  Muscle weakness (generalized)  Rationale for Evaluation and Treatment: Rehabilitation  ONSET DATE: 01/06/24  SUBJECTIVE:                                                                                                                                                                                       SUBJECTIVE STATEMENT: 03/17/2024: shoulder continues to be painful with use. No pain when she is not using the shoulder. Tape didn't help - seemed to make it worse and she had some itching so she took the tape off. Increased pain in the past couple of days. Has used arm for more functional activities in her home in the kitchen and things like making her bed.  Has noticed more popping. Wearing sling when outside the home but not at home. She is working on her exercises at home. States pain continues to be a problem, 6/10 at worst in past week. Will have some pain superiorly at the head of the humerus and down her arm. Pendulum feels good and decreases pain. She wonders if her shoulder doesn't like her.   Post MD visit; Patient reports that the doctor is concerned with tightness in the R shoulder.   EVAL: Patient has a history of R rotator cuff tear with no known injury. Symptoms have been present since ~ 02/02/23. Patient underwent R arthroscopic RCR, DCR, SAD 01/06/24. She presents in abduction sling R UE. She has done okay since surgery. She is sleeping in her recliner and is very sedentary at this time. She has an ice machine that does some compression for home and that has helped with the pain. She sleeps with the ice machine and that helps a lot.  Hand dominance: Right  PERTINENT HISTORY: HTN; asthma; COPD; allergies; anxiety; headaches   PAIN:  Are you having pain? Yes: NPRS scale: 0/10 at rest; 6/10 increases with exercises Pain location: mid arm(deltoid area); minimal to no pain in the shoulder joint  Pain description: muscle soreness  Aggravating factors: moving in her sleep; trying the pendulum  Relieving factors: ice machine; pain meds  PRECAUTIONS: Shoulder post op per protocol     WEIGHT BEARING RESTRICTIONS: Yes   FALLS:  Has patient fallen in last 6 months? Yes. 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  DIAGNOSTIC FINDINGS:  X-ray R shoulder: 04/06/24: FINDINGS: The bones are subjectively under mineralized. There is no evidence of fracture or dislocation. The alignment is normal. The joint spaces are normal. There is no evidence of arthropathy or other focal bone abnormality. Soft tissues are unremarkable.   IMPRESSION: Subjective osteopenia/osteoporosis. Otherwise negative radiographs of the right shoulder  PATIENT SURVEYS:  Quick DASH: 84.1/100; 84.1%  03/10/24 quickdash: 52.3%      SENSATION: WFL  POSTURE: Patient presents with head forward posture with increased thoracic kyphosis; shoulders rounded and elevated   UPPER EXTREMITY ROM: R shoulder deferred due to surgery at eval     02/16/24: painful and tight end rang motion - patient with difficulty relaxing for PT ROM  Active/Passive ROM Right eval Left Eval AROM Right  PROM 02/16/24 R PROM 03/10/24  Shoulder flexion  132 91 102 deg AA (PROM limited by muscle guarding)  A: pt self demonstrates <50 deg, not formally measured  Shoulder extension  47    Shoulder abduction (scaption)  124 60 60 deg PROM ; limited by muscle guarding  Shoulder adduction      Shoulder internal rotation  T10    Shoulder external rotation  49 30 35 deg P/AAROM  Elbow flexion 100     Elbow extension -40     Wrist flexion WFL's     Wrist extension WFL's     Wrist ulnar deviation      Wrist radial deviation      Forearm pronation WFL's      Forearm supination WFL's     (Blank rows = not  tested)  UPPER EXTREMITY MMT: deferred due to surgery    MMT Right eval Left eval  Shoulder flexion    Shoulder extension    Shoulder abduction    Shoulder adduction    Shoulder internal rotation    Shoulder external rotation    Middle trapezius    Lower trapezius    Elbow flexion    Elbow extension    Wrist flexion    Wrist extension    Wrist ulnar deviation    Wrist radial deviation    Wrist pronation    Wrist supination    Grip strength (lbs)    (Blank rows = not tested) Comments: 03/10/24 deferred given postop protocol  PALPATION:  Tightness R shoulder girdle    [facility] Adult PT Treatment:                                                DATE: 03/17/2024 Therapeutic Exercise: Standing pendulum 30 CW/30 CCW Pulley flexion 10 sec x 10  Pulley scaption 10 sec x 10  Pulley horizontal ab/adduction at ~ 70 deg abduction x 10  Manual Therapy: STM R shoulder girdle patient supine  PROM R shoulder into flexion 122; scaption 90; ER at side in scapular plane 40 PT assist for scapular stability in standing as patient actively flexed UE X 10  Neuromuscular re-education:  Standing AROM shoulder ER noodle along wall with focus on activation of posterior shoulder girdle  musculature Forward prop for row through partial range working on posterior shoulder girdle activation Supine scap squeeze working on activation of posterior shoulder girdle  Therapeutic Activity: Standing:  Bent row x 10 Scap squeeze with noodle 10x10 Standing with noodle, towel under arm for shoulder ER with cane 10x10 Shoulder flexion step back at counter  10 sec x 5  Supine AAROM shoulder flexion to tolerance, patient assisting w/ L LE x 3 Isometric step back row green TB 5 sec x 5 reps  Isometric ER step out red TB 3 sec x 10  Isometric IR step out red TB 3 sec x 10   Modalities: Vaso medium pressure; 34 deg; 10 min   [facility] Adult PT Treatment:                                                DATE:  03/15/2024 Therapeutic Exercise: Standing pendulum 30 CW/30 CCW Pulley flexion 10 sec x 10  Pulley scaption 10 sec x 10  Pulley horizontal ab/adduction at ~ 70 deg abduction x 10  Manual Therapy: STM R shoulder girdle patient supine  PROM R shoulder into flexion 122; scaption 90; ER at side in scapular plane 40 Taping - trial of unloading the deltoid with anterior to posterior bias. 3 strops of kinesotape - good support noted with taping  Therapeutic Activity: Standing:  Bent row x 10 Scap squeeze with noodle 10x10 Standing with noodle, towel under arm for shoulder ER with cane 10x10 Shoulder flexion step back at counter  10 sec x 5  Supine AAROM shoulder flexion to tolerance, patient assisting w/ L LE x 3 Isometric step back row red TB 5 sec x 5 reps  Isometric ER step out red TB 3 sec x 10  Isometric IR step out red TB 3 sec x 10  Modalities: Vaso medium pressure; 34 deg; 10 min   [facility] Adult PT Treatment:                                                DATE: 03/10/24 Therapeutic Exercise: Hands clasped supine shoulder flexion x8 Bent row, unresisted, RUE only x10 cues for scapular retraction Reclined hands clasped shoulder flexion, small arc (<75deg) 2x6  Pulley scaption 2 min cues for comfortable ROM HEP discussion/education  Manual Therapy: passive physiological movement  R GHJ to pt tolerance all planes, gentle oscillations for muscle guarding, verbal cues for muscle guarding  Therapeutic Activity: MSK assessment + education QuickDASH + education Education/discussion re: progress with PT, symptom behavior as it affects activity tolerance, PT goals/POC   Modalities: Vaso R shoulder; seated w/ pillow; low compression 34 deg    [facility] Adult PT Treatment:                                                DATE: 03/08/2024 Therapeutic Exercise: Standing pendulum 30 CW/30 CCW Pulley flexion 10 sec x 10  Pulley scaption 10 sec x 10  Pulley horizontal ab/adduction at ~ 70 deg  abduction x 10  Manual Therapy: STM R shoulder girdle patient supine  PROM R shoulder  into flexion 120; scaption 90; ER at side in scapular plane 40 Therapeutic Activity: Standing:  Bent row x 10 Scap squeeze with noodle 10x10 Standing with noodle, towel under arm for shoulder ER with cane 10x10 Shoulder flexion step back at counter  10 sec x 5  Supine AAROM shoulder flexion to tolerance, patient assisting w/ L LE x 3 Isometric step back row red TB 5 sec x 5 reps  Modalities: Vaso medium pressure; 34 deg; 10 min                                                                                                                 PATIENT EDUCATION: Education details: POC; HEP Person educated: Patient Education method: Programmer, multimedia, Demonstration, Actor cues, Verbal cues, and Handouts Education comprehension: verbalized understanding, returned demonstration, verbal cues required, tactile cues required, and needs further education  HOME EXERCISE PROGRAM: Access Code: [email or web address] URL: [email or web address] Date: 03/15/2024 Prepared by: Zilpha Mcandrew  Exercises - Seated Straight Fist AROM  - 2 x daily - 7 x weekly - 1 sets - 5 reps -   hold - Wrist AROM Flexion Extension  - 2 x daily - 7 x weekly - 1 sets - 5 reps - Wrist Circumduction AROM  - 2 x daily - 7 x weekly - 1 sets - 5-10 reps - Seated Forearm Pronation and Supination AROM  - 2 x daily - 7 x weekly - 1 sets - 5 reps - Seated Elbow Flexion and Extension AROM  - 2 x daily - 7 x weekly - 1 sets - 5 reps - Seated Scapular Retraction  - 2 x daily - 7 x weekly - 1-2 sets - 10 reps - 10 sec  hold - Seated Shoulder Shrugs  - 2 x daily - 7 x weekly - 1-2 sets - 10 reps - 2-3 sec  hold - Seated Shoulder Flexion AAROM with Pulley Behind  - 2 x daily - 7 x weekly - 1 sets - 10 reps - 10 sec  hold - Seated Shoulder Scaption AAROM with Pulley at Side  - 2 x daily - 7 x weekly - 1 sets - 10 reps - 10sec  hold - Standing Shoulder and  Trunk Flexion at Table  - 2 x daily - 7 x weekly - 1 sets - 5-10 reps - 10 sec  hold - Seated Shoulder External Rotation AAROM with Cane and Hand in Neutral  - 2 x daily - 7 x weekly - 1 sets - 5-10 reps - 5-10 sec  hold - Seated Shoulder Flexion  - 2 x daily - 7 x weekly - 1 sets - 5-10 reps - 2-3 sec  hold - Seated Shoulder Flexion Towel Slide at Table Top Full Range of Motion  - 2 x daily - 7 x weekly - 1 sets - 5-10 reps - 10sec  hold - Supine Shoulder Flexion AAROM with Hands Clasped  - 2 x daily - 7 x weekly - 1 sets - 5-10 reps - 2-3 sec  hold - Isometric Shoulder Abduction at Wall  - 2 x daily - 7 x weekly - 1 sets - 5-10 reps - 5 sec  hold - Isometric Shoulder External Rotation at Wall  - 1 x daily - 7 x weekly - 1 sets - 10 reps - 5 sec  hold - Standing Isometric Shoulder Internal Rotation with Towel Roll at Doorway  - 2 x daily - 7 x weekly - 1 sets - 5 reps - 5 sec  hold - Isometric Shoulder Extension at Wall  - 2 x daily - 7 x weekly - 1 sets - 5-10 reps - 5 sec  hold - External Rotation Reactive Isometrics with Flex Bar  - 2 x daily - 7 x weekly - 1-2 sets - 10 reps - 3-5 sec  hold - Shoulder Internal Rotation Reactive Isometrics  - 2 x daily - 7 x weekly - 1 sets - 10 reps - 3-5 sec  hold  ASSESSMENT:  CLINICAL IMPRESSION: 03/17/2024: patient reports continued increased pain in the R shoulder as well as popping with certain activities. She has been using R UE for more functional activities at home and that is when she notices pain. Observing patient in clinic she exhibits significant forward posture with head of the humerus translating forward as she reaches forward. When PT provides stabilization for the scapula and assist to maintain position for movement through partial range in elevation. She has no pain with partial range elevation when supported in more anatomically correct position. Continued with PROM; AAROM; light resistive exercises. Patient remains limited by pain and muscle  guarding. She has difficulty relaxing for manual work and PROM exhibiting increased muscle guarding with PROM. Patient states that she is using R UE to perform ADLs and household tasks which are forward in nature but she is not lifting with R UE. Suggested patient avoid these painful activities and work on the exercises she did in clinic today to facilitate posterior shoulder girdle activation. Increased pain related to forward activities. Educated patient re-forward nature of most ADL's and sitting/sleeping postures and encouraged consistent postural awareness engaging posterior shoulder girdle to avoid anterior position of GH joint and scapulae.     Eval: Patient is a 73 y.o. female who was seen today for physical therapy evaluation and treatment  s/p R RCR, SAD, DCR 01/06/24 following nontraumatic complete tear of R RC. She presents decreased ROM and strength R UE, with R shoulder pain and decreased function. She has poor posture and alignment; limited cervical and shoulder ROM, mobility, strength, function. She will benefit from PT to address problems identified.   OBJECTIVE IMPAIRMENTS: decreased activity tolerance, decreased mobility, decreased ROM, decreased strength, increased fascial restrictions, increased muscle spasms, improper body mechanics, postural dysfunction, obesity, and pain.   GOALS: Goals reviewed with patient? Yes  SHORT TERM GOALS: Target date: 02/16/2024  Independent in initial HEP Baseline: Goal status: met  2.  Progress with PROM R shoulder per protocol  Baseline:  03/10/24: see ROM chart above Goal status: on going   3.  Initiate AROM exercises per protocol  Baseline:  03/10/24: not formally assessed given pain levels w/ PROM/AAROM, pt self-demonstrates <40 deg Goal status: on going    LONG TERM GOALS: Target date: 04/21/2024 (updated 03/10/24)  Increase AROM R shoulder to WFL's as indicated per protocol  Baseline:  03/10/24: not assessed given pain levels w/  PROM/AAROM Goal status: on going   2.  4/5 to 5/5 strength R shoulder  Baseline:  03/10/24: not assessed given post op timeline Goal status: on going   3.  Patient reports use of R UE for functional activities  Baseline:  03/10/24: limited by pain and post op protocol Goal status: on going   4.  Patient reports ability to sleep without awakening due to R shoulder pain  Baseline:  03/10/24: able to sleep without waking due to pain Goal status: MET   5.  Independent in advanced HEP  Baseline:  03/10/24: reports good adherence w/ current HEP but does have some pain Goal status: on going   6.  Improve Quick DASH by 25-30%  Baseline: 84.1/100; 84.1% 03/10/24: 52.3% Goal status: on going   PLAN: (updated 03/10/24)  PT FREQUENCY: 1-2x/week  PT DURATION: 6 weeks  PLANNED INTERVENTIONS: 97164- PT Re-evaluation, 97110-Therapeutic exercises, 97530- Therapeutic activity, 97112- Neuromuscular re-education, 97535- Self Care, [id number]- Manual therapy, 97016- Vasopneumatic device, and Patient/Family education  PLAN FOR NEXT SESSION: shoulder rehab per MD protocol - add active shoulder flexion in standing; rows; isometric IR/ER; work on ROM in supine   Pericles Carmicheal P. Ina PT, MPH 03/17/24 11:00 AM

## 2024-03-22 ENCOUNTER — Encounter: Payer: Self-pay | Admitting: Sports Medicine

## 2024-03-22 ENCOUNTER — Encounter: Payer: Self-pay | Admitting: Rehabilitative and Restorative Service Providers"

## 2024-03-22 ENCOUNTER — Ambulatory Visit: Payer: Self-pay | Attending: Orthopaedic Surgery | Admitting: Rehabilitative and Restorative Service Providers"

## 2024-03-22 DIAGNOSIS — R293 Abnormal posture: Secondary | ICD-10-CM | POA: Insufficient documentation

## 2024-03-22 DIAGNOSIS — M25511 Pain in right shoulder: Secondary | ICD-10-CM | POA: Insufficient documentation

## 2024-03-22 DIAGNOSIS — M6281 Muscle weakness (generalized): Secondary | ICD-10-CM | POA: Diagnosis not present

## 2024-03-22 DIAGNOSIS — R29898 Other symptoms and signs involving the musculoskeletal system: Secondary | ICD-10-CM | POA: Insufficient documentation

## 2024-03-22 NOTE — Therapy (Signed)
 OUTPATIENT PHYSICAL THERAPY SHOULDER TREATMENT   Patient Name: Ana King MRN: [id number] DOB:[birth year], 73 y.o., female Today's Date: 03/22/2024    END OF SESSION:  PT End of Session - 03/22/24 1355     Visit Number 13    Number of Visits 22    Date for PT Re-Evaluation 04/21/24    Authorization Type human[employer] prior auth required copay $25    Authorization Time Period 20 VISITS APPROVED FOR PT 01/12/2024-03/22/2024    Authorization - Visit Number 13    Authorization - Number of Visits 20    Progress Note Due on Visit 20    PT Start Time 1356    PT Stop Time 1445    PT Time Calculation (min) 49 min    Activity Tolerance Patient tolerated treatment well          Past Medical History:  Diagnosis Date   Asthma    Essential hypertension, benign 08/28/2015   GERD (gastroesophageal reflux disease)    Past Surgical History:  Procedure Laterality Date   ABDOMINAL HYSTERECTOMY     Patient Active Problem List   Diagnosis Date Noted   Fall 01/01/2024   Hypotension 09/23/2023   Lung consolidation (HCC) 09/23/2023   Abnormal CT lung screening 09/23/2023   Hot flashes 09/23/2023   Opacity of lung on imaging study 06/29/2023   Pulmonary nodule 06/29/2023   Rotator cuff tear, right 04/22/2023   Excessive daytime sleepiness 03/17/2023   Fatigue 03/17/2023   GAD (generalized anxiety disorder) 03/10/2023   Anxiety attack 03/10/2023   Atypical chest pain 02/10/2023   Chronic obstructive pulmonary disease (HCC) 02/10/2023   Mild intermittent asthma with exacerbation 02/04/2023   Pain of great toe, left 12/12/2022   Gout 09/05/2022   Great toe pain, right 09/03/2022   Pruritus 08/05/2022   Chronic allergic rhinitis 07/08/2022   Migraine without aura and without status migrainosus, not intractable 03/28/2022   Seasonal allergic rhinitis due to pollen 12/24/2021   Seasonal allergies 12/24/2021   Decreased GFR 12/24/2021   Injury of right leg 10/14/2021   Bilateral leg edema  07/30/2021   Left ear impacted cerumen 07/30/2021   Vertigo 07/30/2021   Coronary artery calcification seen on CT scan 04/17/2021   Aortic atherosclerosis (HCC) 04/17/2021   Centrilobular emphysema (HCC) 04/17/2021   Grade I diastolic dysfunction 02/26/2021   CKD (chronic kidney disease) stage 3, GFR 30-59 ml/min (HCC) 02/20/2021   AKI (acute kidney injury) (HCC) 02/21/2020   Hyponatremia 02/20/2020   Hypocalcemia 02/20/2020   Low hemoglobin 02/20/2020   Herpes zoster with ophthalmic complication 02/03/2020   Acute intractable headache 01/31/2020   Vision changes 01/31/2020   Non-intractable vomiting 01/31/2020   Eye pain, right 01/31/2020   Plantar fasciitis, left 01/24/2020   Diarrhea 11/08/2019   OSA (obstructive sleep apnea) 09/20/2019   Hyperlipidemia 08/18/2019   Non-restorative sleep 08/17/2019   Snoring 08/17/2019   Class 2 obesity due to excess calories without serious comorbidity with body mass index (BMI) of 36.0 to 36.9 in adult 08/17/2019   Cervical spondylosis 05/10/2019   SOB (shortness of breath) on exertion 01/11/2019   Depressed mood 01/11/2019   Osteopenia 03/17/2018   Chronic venous stasis 12/22/2017   Edema of left ankle 12/22/2017   Reactive airway disease 06/09/2017   Former smoker 06/09/2017   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   Contusion 11/13/2015   Essential hypertension, benign 08/28/2015   Hiatal hernia 12/09/2013   Anxiety state 11/02/2013   GERD (gastroesophageal reflux disease) 11/02/2013  PCP: Vermell LITTIE Bologna, PA-C  REFERRING PROVIDER: Dr Elspeth CHRISTELLA Farrow   REFERRING DIAG: R shoulder scope with RCR, SAD, DCR  THERAPY DIAG:  Acute pain of right shoulder  Abnormal posture  Other symptoms and signs involving the musculoskeletal system  Muscle weakness (generalized)  Rationale for Evaluation and Treatment: Rehabilitation  ONSET DATE: 01/06/24  SUBJECTIVE:                                                                                                                                                                                       SUBJECTIVE STATEMENT: 03/22/2024: shoulder is feeling some better with less pain. She continues to have popping in the shoulder when she lifts arm up of across her body.  continues to be painful with use. No pain when she is not using the shoulder. Has used arm for more functional activities in her home in the kitchen and things like making her bed. Wearing sling when outside the home but not at home. She is working on her exercises at home. States pain continues to be a problem, 6/10 at worst in past week. Pendulum feels good and decreases pain.   Post MD visit; Patient reports that the doctor is concerned with tightness in the R shoulder.   EVAL: Patient has a history of R rotator cuff tear with no known injury. Symptoms have been present since ~ 02/02/23. Patient underwent R arthroscopic RCR, DCR, SAD 01/06/24. She presents in abduction sling R UE. She has done okay since surgery. She is sleeping in her recliner and is very sedentary at this time. She has an ice machine that does some compression for home and that has helped with the pain. She sleeps with the ice machine and that helps a lot.  Hand dominance: Right  PERTINENT HISTORY: HTN; asthma; COPD; allergies; anxiety; headaches   PAIN:  Are you having pain? Yes: NPRS scale: 0/10 at rest; 6/10 increases with exercises Pain location: mid arm(deltoid area); minimal to no pain in the shoulder joint  Pain description: muscle soreness  Aggravating factors: moving in her sleep; trying the pendulum  Relieving factors: ice machine; pain meds  PRECAUTIONS: Shoulder post op per protocol     WEIGHT BEARING RESTRICTIONS: Yes   FALLS:  Has patient fallen in last 6 months? Yes. 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  DIAGNOSTIC FINDINGS:  X-ray R shoulder: 04/06/24: FINDINGS: The bones are subjectively under mineralized. There is no evidence of fracture or dislocation. The alignment is normal. The joint spaces are normal. There is no evidence of arthropathy or other focal bone abnormality. Soft tissues are unremarkable.   IMPRESSION: Subjective osteopenia/osteoporosis. Otherwise negative radiographs of the right shoulder  PATIENT SURVEYS:  Quick DASH: 84.1/100; 84.1%  03/10/24 quickdash: 52.3%      SENSATION: WFL  POSTURE: Patient presents with head forward posture with increased thoracic kyphosis; shoulders rounded and elevated   UPPER EXTREMITY ROM: R shoulder deferred due to surgery at eval     02/16/24: painful and tight end rang motion - patient with difficulty relaxing for PT ROM  Active/Passive ROM Right eval Left Eval AROM Right  PROM 02/16/24 R PROM 03/10/24  Shoulder flexion  132 91 102 deg AA (PROM limited by muscle guarding)  A: pt self demonstrates <50 deg, not formally measured  Shoulder extension  47    Shoulder abduction (scaption)  124 60 60 deg PROM ; limited by muscle guarding  Shoulder adduction      Shoulder internal rotation  T10    Shoulder external rotation  49 30 35 deg P/AAROM  Elbow flexion 100     Elbow extension -40     Wrist flexion WFL's     Wrist extension WFL's     Wrist ulnar deviation      Wrist radial deviation      Forearm pronation WFL's      Forearm supination WFL's     (Blank rows = not tested)  UPPER EXTREMITY MMT: deferred due to surgery    MMT Right eval Left eval  Shoulder flexion     Shoulder extension    Shoulder abduction    Shoulder adduction    Shoulder internal rotation    Shoulder external rotation    Middle trapezius    Lower trapezius    Elbow flexion    Elbow extension    Wrist flexion    Wrist extension    Wrist ulnar deviation    Wrist radial deviation    Wrist pronation    Wrist supination    Grip strength (lbs)    (Blank rows = not tested) Comments: 03/10/24 deferred given postop protocol  PALPATION:  Tightness R shoulder girdle     [facility] Adult PT Treatment:                                                DATE: 03/22/2024 Therapeutic Exercise: Standing pendulum 30 CW/30 CCW Table slide shoulder flexion 10 sec x 4 Active shoulder flexion x 4; repeated with PR providing stabilization for shoulder girdle    Manual Therapy: STM R shoulder girdle patient supine  PROM R shoulder into flexion 120(down 2 deg from last visit); scaption 90; ER at side in scapular plane 40 PT assist for scapular stability in standing as patient actively flexed UE X 10  Neuromuscular re-education:  Sitting with coregeous ball thoracic spine working on activation of posterior shoulder girdle  Sitting with scap squeeze with coregeous ball working on active shoulder flexion  Standing AROM shoulder ER noodle along  wall with focus on activation of posterior shoulder girdle musculature Forward prop for row with 2# DB through partial range working on posterior shoulder girdle activation and control 10 x 2  Supine scap squeeze working on activation of posterior shoulder girdle  Therapeutic Activity: Standing:  Bent row   Scap squeeze with noodle 10x10 Shoulder flexion step back at counter  10 sec x 5  Supine AAROM shoulder flexion to tolerance, patient assisting w/ L LE x 3   Modalities: Vaso medium pressure; 34 deg; 10 min   [facility] Adult PT Treatment:                                                DATE: 03/17/2024 Therapeutic Exercise: Standing pendulum 30 CW/30  CCW Pulley flexion 10 sec x 10  Pulley scaption 10 sec x 10  Pulley horizontal ab/adduction at ~ 70 deg abduction x 10  Manual Therapy: STM R shoulder girdle patient supine  PROM R shoulder into flexion 122; scaption 90; ER at side in scapular plane 40 PT assist for scapular stability in standing as patient actively flexed UE X 10  Neuromuscular re-education:  Standing AROM shoulder ER noodle along wall with focus on activation of posterior shoulder girdle musculature Forward prop for row through partial range working on posterior shoulder girdle activation Supine scap squeeze working on activation of posterior shoulder girdle  Therapeutic Activity: Standing:  Bent row x 10 Scap squeeze with noodle 10x10 Standing with noodle, towel under arm for shoulder ER with cane 10x10 Shoulder flexion step back at counter  10 sec x 5  Supine AAROM shoulder flexion to tolerance, patient assisting w/ L LE x 3 Isometric step back row green TB 5 sec x 5 reps  Isometric ER step out red TB 3 sec x 10  Isometric IR step out red TB 3 sec x 10    Modalities: Vaso medium pressure; 34 deg; 10 min   [facility] Adult PT Treatment:                                                DATE: 03/15/2024 Therapeutic Exercise: Standing pendulum 30 CW/30 CCW Pulley flexion 10 sec x 10  Pulley scaption 10 sec x 10  Pulley horizontal ab/adduction at ~ 70 deg abduction x 10  Manual Therapy: STM R shoulder girdle patient supine  PROM R shoulder into flexion 122; scaption 90; ER at side in scapular plane 40 Taping - trial of unloading the deltoid with anterior to posterior bias. 3 strops of kinesotape - good support noted with taping  Therapeutic Activity: Standing:  Bent row x 10 Scap squeeze with noodle 10x10 Standing with noodle, towel under arm for shoulder ER with cane 10x10 Shoulder flexion step back at counter  10 sec x 5  Supine AAROM shoulder flexion to tolerance, patient assisting w/ L LE x 3 Isometric step  back row red TB 5 sec x 5 reps  Isometric ER step out red TB 3 sec x 10  Isometric IR step out red TB 3 sec x 10  Modalities: Vaso medium pressure; 34 deg; 10 min  PATIENT EDUCATION: Education details: POC; HEP Person educated: Patient Education method: Programmer, multimedia, Demonstration, Actor cues, Verbal cues, and Handouts Education comprehension: verbalized understanding, returned demonstration, verbal cues required, tactile cues required, and needs further education  HOME EXERCISE PROGRAM: Access Code: [email or web address] URL: [email or web address] Date: 03/15/2024 Prepared by: Jasman Pfeifle  Exercises - Seated Straight Fist AROM  - 2 x daily - 7 x weekly - 1 sets - 5 reps -   hold - Wrist AROM Flexion Extension  - 2 x daily - 7 x weekly - 1 sets - 5 reps - Wrist Circumduction AROM  - 2 x daily - 7 x weekly - 1 sets - 5-10 reps - Seated Forearm Pronation and Supination AROM  - 2 x daily - 7 x weekly - 1 sets - 5 reps - Seated Elbow Flexion and Extension AROM  - 2 x daily - 7 x weekly - 1 sets - 5 reps - Seated Scapular Retraction  - 2 x daily - 7 x weekly - 1-2 sets - 10 reps - 10 sec  hold - Seated Shoulder Shrugs  - 2 x daily - 7 x weekly - 1-2 sets - 10 reps - 2-3 sec  hold - Seated Shoulder Flexion AAROM with Pulley Behind  - 2 x daily - 7 x weekly - 1 sets - 10 reps - 10 sec  hold - Seated Shoulder Scaption AAROM with Pulley at Side  - 2 x daily - 7 x weekly - 1 sets - 10 reps - 10sec  hold - Standing Shoulder and Trunk Flexion at Table  - 2 x daily - 7 x weekly - 1 sets - 5-10 reps - 10 sec  hold - Seated Shoulder External Rotation AAROM with Cane and Hand in Neutral  - 2 x daily - 7 x weekly - 1 sets - 5-10 reps - 5-10 sec  hold - Seated Shoulder Flexion  - 2 x daily - 7 x weekly - 1 sets - 5-10 reps - 2-3 sec  hold - Seated Shoulder Flexion Towel Slide at Table Top Full  Range of Motion  - 2 x daily - 7 x weekly - 1 sets - 5-10 reps - 10sec  hold - Supine Shoulder Flexion AAROM with Hands Clasped  - 2 x daily - 7 x weekly - 1 sets - 5-10 reps - 2-3 sec  hold - Isometric Shoulder Abduction at Wall  - 2 x daily - 7 x weekly - 1 sets - 5-10 reps - 5 sec  hold - Isometric Shoulder External Rotation at Wall  - 1 x daily - 7 x weekly - 1 sets - 10 reps - 5 sec  hold - Standing Isometric Shoulder Internal Rotation with Towel Roll at Doorway  - 2 x daily - 7 x weekly - 1 sets - 5 reps - 5 sec  hold - Isometric Shoulder Extension at Wall  - 2 x daily - 7 x weekly - 1 sets - 5-10 reps - 5 sec  hold - External Rotation Reactive Isometrics with Flex Bar  - 2 x daily - 7 x weekly - 1-2 sets - 10 reps - 3-5 sec  hold - Shoulder Internal Rotation Reactive Isometrics  - 2 x daily - 7 x weekly - 1 sets - 10 reps - 3-5 sec  hold  ASSESSMENT:  CLINICAL IMPRESSION: 03/22/2024: patient reports continued increased pain in the R shoulder and popping with certain activities. Will have some pain superiorly at the head  of the humerus and down her arm. She has been using R UE for more functional activities at home and she continues to notice pain with functional activities. Briseis exhibits significant forward posture with head of the humerus translating forward and shoulder girdle elevating as she reaches forward. When PT provides stabilization for the scapula and assist to maintain position for movement through partial range in elevation movement is improved. She has no pain with partial range elevation when supported in more anatomically correct position. Continued with PROM; AAROM; light resistive exercises. Patient remains limited by pain and muscle guarding. She has difficulty relaxing for manual work and PROM exhibiting increased muscle guarding with PROM. Patient states that she is using R UE to perform ADLs and household tasks which are forward in nature but she is not lifting with R UE.  Suggested patient avoid these painful activities and work on the exercises she did in clinic today to facilitate posterior shoulder girdle activation. Increased pain related to forward activities. Continued educated patient re-forward nature of most ADL's and sitting/sleeping postures and encouraged consistent postural awareness engaging posterior shoulder girdle to avoid anterior position of GH joint and scapulae.     Eval: Patient is a 73 y.o. female who was seen today for physical therapy evaluation and treatment  s/p R RCR, SAD, DCR 01/06/24 following nontraumatic complete tear of R RC. She presents decreased ROM and strength R UE, with R shoulder pain and decreased function. She has poor posture and alignment; limited cervical and shoulder ROM, mobility, strength, function. She will benefit from PT to address problems identified.   OBJECTIVE IMPAIRMENTS: decreased activity tolerance, decreased mobility, decreased ROM, decreased strength, increased fascial restrictions, increased muscle spasms, improper body mechanics, postural dysfunction, obesity, and pain.   GOALS: Goals reviewed with patient? Yes  SHORT TERM GOALS: Target date: 02/16/2024  Independent in initial HEP Baseline: Goal status: met  2.  Progress with PROM R shoulder per protocol  Baseline:  03/10/24: see ROM chart above Goal status: on going   3.  Initiate AROM exercises per protocol  Baseline:  03/10/24: not formally assessed given pain levels w/ PROM/AAROM, pt self-demonstrates <40 deg Goal status: on going    LONG TERM GOALS: Target date: 04/21/2024 (updated 03/10/24)  Increase AROM R shoulder to WFL's as indicated per protocol  Baseline:  03/10/24: not assessed given pain levels w/ PROM/AAROM Goal status: on going   2.  4/5 to 5/5 strength R shoulder  Baseline:  03/10/24: not assessed given post op timeline Goal status: on going   3.  Patient reports use of R UE for functional activities  Baseline:  03/10/24:  limited by pain and post op protocol Goal status: on going   4.  Patient reports ability to sleep without awakening due to R shoulder pain  Baseline:  03/10/24: able to sleep without waking due to pain Goal status: MET   5.  Independent in advanced HEP  Baseline:  03/10/24: reports good adherence w/ current HEP but does have some pain Goal status: on going   6.  Improve Quick DASH by 25-30%  Baseline: 84.1/100; 84.1% 03/10/24: 52.3% Goal status: on going   PLAN: (updated 03/10/24)  PT FREQUENCY: 1-2x/week  PT DURATION: 6 weeks  PLANNED INTERVENTIONS: 97164- PT Re-evaluation, 97110-Therapeutic exercises, 97530- Therapeutic activity, 97112- Neuromuscular re-education, 97535- Self Care, [id number]- Manual therapy, 97016- Vasopneumatic device, and Patient/Family education  PLAN FOR NEXT SESSION: shoulder rehab per MD protocol - add active shoulder flexion in standing; rows;  isometric IR/ER; work on ROM in supine   Elias Dennington P. Ina PT, MPH 03/22/24 1:56 PM

## 2024-03-24 ENCOUNTER — Ambulatory Visit

## 2024-03-24 ENCOUNTER — Encounter: Payer: Self-pay | Admitting: Rehabilitative and Restorative Service Providers"

## 2024-03-24 ENCOUNTER — Ambulatory Visit: Payer: Self-pay | Admitting: Rehabilitative and Restorative Service Providers"

## 2024-03-24 VITALS — Temp 98.3°F

## 2024-03-24 DIAGNOSIS — M6281 Muscle weakness (generalized): Secondary | ICD-10-CM

## 2024-03-24 DIAGNOSIS — R29898 Other symptoms and signs involving the musculoskeletal system: Secondary | ICD-10-CM | POA: Diagnosis not present

## 2024-03-24 DIAGNOSIS — R293 Abnormal posture: Secondary | ICD-10-CM

## 2024-03-24 DIAGNOSIS — Z23 Encounter for immunization: Secondary | ICD-10-CM

## 2024-03-24 DIAGNOSIS — M25511 Pain in right shoulder: Secondary | ICD-10-CM

## 2024-03-24 NOTE — Progress Notes (Signed)
 Injection was tolerated well by the patient. (See MAR for injection details)

## 2024-03-24 NOTE — Therapy (Signed)
 OUTPATIENT PHYSICAL THERAPY SHOULDER TREATMENT   Patient Name: Ana King MRN: [id number] DOB:06-06-1951, 73 y.o., female Today's Date: 03/24/2024    END OF SESSION:  PT End of Session - 03/24/24 1049     Visit Number 14    Number of Visits 22    Date for PT Re-Evaluation 04/21/24    Authorization Type [employer] prior auth required copay $25    Authorization Time Period 20 VISITS APPROVED FOR PT 01/12/2024-03/22/2024    Authorization - Visit Number 14    Authorization - Number of Visits 20    Progress Note Due on Visit 20    PT Start Time 1055    PT Stop Time 1145    PT Time Calculation (min) 50 min          Past Medical History:  Diagnosis Date   Asthma    Essential hypertension, benign 08/28/2015   GERD (gastroesophageal reflux disease)    Past Surgical History:  Procedure Laterality Date   ABDOMINAL HYSTERECTOMY     Patient Active Problem List   Diagnosis Date Noted   Fall 01/01/2024   Hypotension 09/23/2023   Lung consolidation (HCC) 09/23/2023   Abnormal CT lung screening 09/23/2023   Hot flashes 09/23/2023   Opacity of lung on imaging study 06/29/2023   Pulmonary nodule 06/29/2023   Rotator cuff tear, right 04/22/2023   Excessive daytime sleepiness 03/17/2023   Fatigue 03/17/2023   GAD (generalized anxiety disorder) 03/10/2023   Anxiety attack 03/10/2023   Atypical chest pain 02/10/2023   Chronic obstructive pulmonary disease (HCC) 02/10/2023   Mild intermittent asthma with exacerbation 02/04/2023   Pain of great toe, left 12/12/2022   Gout 09/05/2022   Great toe pain, right 09/03/2022   Pruritus 08/05/2022   Chronic allergic rhinitis 07/08/2022   Migraine without aura and without status migrainosus, not intractable 03/28/2022   Seasonal allergic rhinitis due to pollen 12/24/2021   Seasonal allergies 12/24/2021   Decreased GFR 12/24/2021   Injury of right leg 10/14/2021   Bilateral leg edema 07/30/2021   Left ear impacted cerumen 07/30/2021    Vertigo 07/30/2021   Coronary artery calcification seen on CT scan 04/17/2021   Aortic atherosclerosis (HCC) 04/17/2021   Centrilobular emphysema (HCC) 04/17/2021   Grade I diastolic dysfunction 02/26/2021   CKD (chronic kidney disease) stage 3, GFR 30-59 ml/min (HCC) 02/20/2021   AKI (acute kidney injury) (HCC) 02/21/2020   Hyponatremia 02/20/2020   Hypocalcemia 02/20/2020   Low hemoglobin 02/20/2020   Herpes zoster with ophthalmic complication 02/03/2020   Acute intractable headache 01/31/2020   Vision changes 01/31/2020   Non-intractable vomiting 01/31/2020   Eye pain, right 01/31/2020   Plantar fasciitis, left 01/24/2020   Diarrhea 11/08/2019   OSA (obstructive sleep apnea) 09/20/2019   Hyperlipidemia 08/18/2019   Non-restorative sleep 08/17/2019   Snoring 08/17/2019   Class 2 obesity due to excess calories without serious comorbidity with body mass index (BMI) of 36.0 to 36.9 in adult 08/17/2019   Cervical spondylosis 05/10/2019   SOB (shortness of breath) on exertion 01/11/2019   Depressed mood 01/11/2019   Osteopenia 03/17/2018   Chronic venous stasis 12/22/2017   Edema of left ankle 12/22/2017   Reactive airway disease 06/09/2017   Former smoker 06/09/2017   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   Contusion 11/13/2015   Essential hypertension, benign 08/28/2015   Hiatal hernia 12/09/2013   Anxiety state 11/02/2013   GERD (gastroesophageal reflux disease) 11/02/2013    PCP: Vermell LITTIE Bologna, PA-C  REFERRING PROVIDER: Dr  Elspeth CHRISTELLA Farrow   REFERRING DIAG: R shoulder scope with RCR, SAD, DCR  THERAPY DIAG:  Acute pain of right shoulder  Abnormal posture  Other symptoms and signs involving the musculoskeletal system  Muscle weakness (generalized)  Rationale for Evaluation and Treatment: Rehabilitation  ONSET DATE: 01/06/24  SUBJECTIVE:                                                                                                                                                                                       SUBJECTIVE STATEMENT: 03/24/2024: [name] reports that shoulder is feeling some better with less pain when arm is at side; still hurts to lift. She is having less popping in the shoulder when she lifts arm up of across her body. Continues to be painful with use. Has used arm for more functional activities in her home in the kitchen and things like making her bed. Wearing sling when outside the home but not at home. She is working on her exercises at home especially working to keep shoulders down and back as she lifts arm. That is difficulty. Pendulum feels good and decreases pain.   Post MD visit; Patient reports that the doctor is concerned with tightness in the R shoulder.   EVAL: Patient has a history of R rotator cuff tear with no known injury. Symptoms have been present since ~ 02/02/23. Patient underwent R arthroscopic RCR, DCR, SAD 01/06/24. She presents in abduction sling R UE. She has done okay since surgery. She is sleeping in her recliner and is very sedentary at this time. She has an ice machine that does some compression for home and that has helped with the pain. She sleeps with the ice machine and that helps a lot.  Hand dominance: Right  PERTINENT HISTORY: HTN; asthma; COPD; allergies; anxiety; headaches   PAIN:  Are you having pain? Yes: NPRS scale: 0/10 at rest; 6/10 increases with exercises Pain location: mid arm(deltoid area); minimal to no pain in the shoulder joint  Pain description: muscle soreness  Aggravating factors: moving in her sleep; trying the pendulum  Relieving factors: ice machine; pain meds  PRECAUTIONS: Shoulder post op per protocol     WEIGHT BEARING RESTRICTIONS: Yes   FALLS:  Has patient fallen in last 6 months? Yes. 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  DIAGNOSTIC FINDINGS:  X-ray R shoulder: 04/06/24: FINDINGS: The bones are subjectively under mineralized. There is no evidence of fracture or dislocation. The alignment is normal. The joint spaces are normal. There is no evidence of arthropathy or other focal bone abnormality. Soft tissues are unremarkable.   IMPRESSION: Subjective osteopenia/osteoporosis. Otherwise negative radiographs of the right shoulder  PATIENT SURVEYS:  Quick DASH: 84.1/100; 84.1%  03/10/24 quickdash: 52.3%      SENSATION: WFL  POSTURE: Patient presents with head forward posture with increased thoracic kyphosis; shoulders rounded and elevated   UPPER EXTREMITY ROM: R shoulder deferred due to surgery at eval     02/16/24: painful and tight end rang motion - patient with difficulty relaxing for PT ROM  Active/Passive ROM Right eval Left Eval AROM Right  PROM 02/16/24 R PROM 03/10/24  Shoulder flexion  132 91 102 deg AA (PROM limited by muscle guarding)  A: pt self demonstrates <50 deg, not formally measured  Shoulder extension  47    Shoulder abduction (scaption)  124 60 60 deg PROM ; limited by muscle guarding  Shoulder adduction      Shoulder internal rotation  T10    Shoulder external rotation  49 30 35 deg P/AAROM  Elbow flexion 100     Elbow extension -40     Wrist flexion WFL's     Wrist extension WFL's     Wrist ulnar deviation      Wrist radial deviation      Forearm pronation WFL's      Forearm supination WFL's     (Blank rows = not tested)  UPPER EXTREMITY MMT: deferred due to surgery    MMT Right eval Left eval  Shoulder flexion    Shoulder extension     Shoulder abduction    Shoulder adduction    Shoulder internal rotation    Shoulder external rotation    Middle trapezius    Lower trapezius    Elbow flexion    Elbow extension    Wrist flexion    Wrist extension    Wrist ulnar deviation    Wrist radial deviation    Wrist pronation    Wrist supination    Grip strength (lbs)    (Blank rows = not tested) Comments: 03/10/24 deferred given postop protocol  PALPATION:  Tightness R shoulder girdle    [facility] Adult PT Treatment:                                                DATE: 03/24/2024 Therapeutic Exercise: Standing pendulum 30 CW/30 CCW Table slide shoulder flexion 10 sec x 4 Active shoulder flexion x 5; repeated with PT providing stabilization for shoulder girdle   Active shoulder abduction with elbows flexed to 90 deg x 5  Rolling large green therapy ball forward on table to work on R shoulder flexion 10 sec hold x 5 x 2  Counter step back shoulder flexion 10 sec x 5   Manual Therapy: STM R shoulder girdle patient supine  PROM R shoulder into flexion 120(down 2 deg from last visit); scaption 90; ER at side in scapular plane 40 PT assist for scapular stability in standing as patient actively flexed UE  X 10  Gentle biceps stretch sitting 20 sec x 3  Neuromuscular re-education:  Sitting with coregeous ball thoracic spine working on activation of posterior shoulder girdle  Sitting with scap squeeze with coregeous ball working on active shoulder flexion  Standing AROM shoulder ER noodle along wall with focus on activation of posterior shoulder girdle musculature Forward prop for row with 3# DB through partial range working on posterior shoulder girdle activation and control 10 x 2  Supine scap squeeze working on activation of posterior shoulder girdle  Therapeutic Activity: Standing:  Isometric row green TB stepping back 5 sec x 10  Isometric ER green TB stepping to R 5 sec x 10  Isometric IR green TB stepping to L 5 sec x 10   Scap squeeze with noodle 10x10 Supine AAROM shoulder flexion to tolerance, patient assisting w/ L UE x 3 Supine punch to ceiling with PT assist x 10  Supine rhythmic stabilization shoulder 90 deg flexion moving through partial range flex/ext x 10  Supine rhythmic stabilization repeated with 1# wt 90 to 110 deg flex x 10   Modalities: Vaso medium pressure; 34 deg; 10 min   [facility] Adult PT Treatment:                                                DATE: 03/22/2024 Therapeutic Exercise: Standing pendulum 30 CW/30 CCW Table slide shoulder flexion 10 sec x 4 Active shoulder flexion x 4; repeated with PR providing stabilization for shoulder girdle    Manual Therapy: STM R shoulder girdle patient supine  PROM R shoulder into flexion 120(down 2 deg from last visit); scaption 90; ER at side in scapular plane 40 PT assist for scapular stability in standing as patient actively flexed UE X 10  Neuromuscular re-education:  Sitting with coregeous ball thoracic spine working on activation of posterior shoulder girdle  Sitting with scap squeeze with coregeous ball working on active shoulder flexion  Standing AROM shoulder ER noodle along wall with focus on activation of posterior shoulder girdle musculature Forward prop for row with 2# DB through partial range working on posterior shoulder girdle activation and control 10 x 2  Supine scap squeeze working on activation of posterior shoulder girdle  Therapeutic Activity: Standing:  Bent row   Scap squeeze with noodle 10x10 Shoulder flexion step back at counter  10 sec x 5  Supine AAROM shoulder flexion to tolerance, patient assisting w/ L UE x 3   Modalities: Vaso medium pressure; 34 deg; 10 min                                                                                                               PATIENT EDUCATION: Education details: POC; HEP Person educated: Patient Education method: Programmer, multimedia, Facilities manager, Actor cues, Verbal  cues, and Handouts Education comprehension: verbalized understanding, returned demonstration, verbal cues required, tactile cues required, and needs further  education  HOME EXERCISE PROGRAM: Access Code: [email or web address] URL: [email or web address] Date: 03/24/2024 Prepared by: Jazaria Jarecki  Exercises - Seated Scapular Retraction  - 2 x daily - 7 x weekly - 1-2 sets - 10 reps - 10 sec  hold - Seated Shoulder Shrugs  - 2 x daily - 7 x weekly - 1-2 sets - 10 reps - 2-3 sec  hold - Seated Shoulder Flexion AAROM with Pulley Behind  - 2 x daily - 7 x weekly - 1 sets - 10 reps - 10 sec  hold - Seated Shoulder Scaption AAROM with Pulley at Side  - 2 x daily - 7 x weekly - 1 sets - 10 reps - 10sec  hold - Standing Shoulder and Trunk Flexion at Table  - 2 x daily - 7 x weekly - 1 sets - 5-10 reps - 10 sec  hold - Seated Shoulder External Rotation AAROM with Cane and Hand in Neutral  - 2 x daily - 7 x weekly - 1 sets - 5-10 reps - 5-10 sec  hold - Seated Shoulder Flexion  - 2 x daily - 7 x weekly - 1 sets - 5-10 reps - 2-3 sec  hold - Seated Shoulder Flexion Towel Slide at Table Top Full Range of Motion  - 2 x daily - 7 x weekly - 1 sets - 5-10 reps - 10sec  hold - Supine Shoulder Flexion AAROM with Hands Clasped  - 2 x daily - 7 x weekly - 1 sets - 5-10 reps - 2-3 sec  hold - Isometric Shoulder Abduction at Wall  - 2 x daily - 7 x weekly - 1 sets - 5-10 reps - 5 sec  hold - Isometric Shoulder External Rotation at Wall  - 1 x daily - 7 x weekly - 1 sets - 10 reps - 5 sec  hold - Standing Isometric Shoulder Internal Rotation with Towel Roll at Doorway  - 2 x daily - 7 x weekly - 1 sets - 5 reps - 5 sec  hold - Isometric Shoulder Extension at Wall  - 2 x daily - 7 x weekly - 1 sets - 5-10 reps - 5 sec  hold - External Rotation Reactive Isometrics with Flex Bar  - 2 x daily - 7 x weekly - 1-2 sets - 10 reps - 3-5 sec  hold - Shoulder Internal Rotation Reactive Isometrics  - 2 x daily - 7 x weekly - 1  sets - 10 reps - 3-5 sec  hold - Supine Shoulder Rhythmic Stabilization- Horizontal Abduction/Adduction  - 2 x daily - 7 x weekly - 1 sets - 5-10 reps - Supine Shoulder Flexion AAROM with Hands Clasped  - 2 x daily - 7 x weekly - 1 sets - 5-10 reps - 2-3 sec  hold - Anterior Shoulder and Biceps Stretch  - 1 x daily - 7 x weekly - 1 sets - 3 reps - 20 sec  hold  ASSESSMENT:  CLINICAL IMPRESSION: 03/24/2024: patient reports slightly less pain in the R shoulder and decreased popping. Patient demonstrates good improvement in scapular retraction pulling scapulae down and back. Working on active elevation with posterior shoulder girdle musculature engaged. Added supine stabilization in supine which was tolerated well. Worked moving shoulder into increased shoulder flexion in supine and patient tolerated 1# DB will this exercise. Good progress with exercises today.   Jernie exhibits significant forward posture with head of the humerus translating forward and shoulder girdle elevating as she reaches forward.  When PT provides stabilization for the scapula and assist to maintain position for movement through partial range in elevation movement is improved. She has no pain with partial range elevation when supported in more anatomically correct position. Continued with PROM; AAROM; light resistive exercises. Patient remains limited by pain and muscle guarding but demonstrates progress. She has difficulty relaxing for manual work and PROM exhibiting increased muscle guarding with PROM. Patient states that she is using R UE to perform ADLs and household tasks which are forward in nature but she is not lifting with R UE. Continued educated patient re-forward nature of most ADL's and sitting/sleeping postures and encouraged consistent postural awareness engaging posterior shoulder girdle to avoid anterior position of GH joint and scapulae.     Eval: Patient is a 73 y.o. female who was seen today for physical therapy  evaluation and treatment  s/p R RCR, SAD, DCR 01/06/24 following nontraumatic complete tear of R RC. She presents decreased ROM and strength R UE, with R shoulder pain and decreased function. She has poor posture and alignment; limited cervical and shoulder ROM, mobility, strength, function. She will benefit from PT to address problems identified.   OBJECTIVE IMPAIRMENTS: decreased activity tolerance, decreased mobility, decreased ROM, decreased strength, increased fascial restrictions, increased muscle spasms, improper body mechanics, postural dysfunction, obesity, and pain.   GOALS: Goals reviewed with patient? Yes  SHORT TERM GOALS: Target date: 02/16/2024  Independent in initial HEP Baseline: Goal status: met  2.  Progress with PROM R shoulder per protocol  Baseline:  03/10/24: see ROM chart above Goal status: on going   3.  Initiate AROM exercises per protocol  Baseline:  03/10/24: not formally assessed given pain levels w/ PROM/AAROM, pt self-demonstrates <40 deg Goal status: on going    LONG TERM GOALS: Target date: 04/21/2024 (updated 03/10/24)  Increase AROM R shoulder to WFL's as indicated per protocol  Baseline:  03/10/24: not assessed given pain levels w/ PROM/AAROM Goal status: on going   2.  4/5 to 5/5 strength R shoulder  Baseline:  03/10/24: not assessed given post op timeline Goal status: on going   3.  Patient reports use of R UE for functional activities  Baseline:  03/10/24: limited by pain and post op protocol Goal status: on going   4.  Patient reports ability to sleep without awakening due to R shoulder pain  Baseline:  03/10/24: able to sleep without waking due to pain Goal status: MET   5.  Independent in advanced HEP  Baseline:  03/10/24: reports good adherence w/ current HEP but does have some pain Goal status: on going   6.  Improve Quick DASH by 25-30%  Baseline: 84.1/100; 84.1% 03/10/24: 52.3% Goal status: on going   PLAN: (updated  03/10/24)  PT FREQUENCY: 1-2x/week  PT DURATION: 6 weeks  PLANNED INTERVENTIONS: 97164- PT Re-evaluation, 97110-Therapeutic exercises, 97530- Therapeutic activity, 97112- Neuromuscular re-education, 97535- Self Care, [id number]- Manual therapy, 97016- Vasopneumatic device, and Patient/Family education  PLAN FOR NEXT SESSION: shoulder rehab per MD protocol - add active shoulder flexion in standing; rows; isometric IR/ER; work on ROM in supine   Angelia Hazell P. Ina PT, MPH 03/24/24 10:50 AM

## 2024-03-29 ENCOUNTER — Ambulatory Visit: Attending: Orthopaedic Surgery | Admitting: Physical Therapy

## 2024-03-31 ENCOUNTER — Encounter: Payer: Self-pay | Admitting: Rehabilitative and Restorative Service Providers"

## 2024-03-31 ENCOUNTER — Ambulatory Visit: Admitting: Rehabilitative and Restorative Service Providers"

## 2024-03-31 DIAGNOSIS — M6281 Muscle weakness (generalized): Secondary | ICD-10-CM

## 2024-03-31 DIAGNOSIS — R293 Abnormal posture: Secondary | ICD-10-CM | POA: Diagnosis not present

## 2024-03-31 DIAGNOSIS — M25511 Pain in right shoulder: Secondary | ICD-10-CM

## 2024-03-31 DIAGNOSIS — R29898 Other symptoms and signs involving the musculoskeletal system: Secondary | ICD-10-CM

## 2024-03-31 NOTE — Therapy (Signed)
 OUTPATIENT PHYSICAL THERAPY SHOULDER TREATMENT   Patient Name: Ana King MRN: [id number] DOB:[birth year], 73 y.o., female Today's Date: 03/31/2024    END OF SESSION:  PT End of Session - 03/31/24 1137     Visit Number 15    Number of Visits 22    Date for PT Re-Evaluation 04/21/24    Authorization Type human[employer] prior auth required copay $25    Authorization Time Period 20 VISITS APPROVED FOR PT 01/12/2024-03/22/2024    Authorization - Visit Number 15    Authorization - Number of Visits 20    PT Start Time 1145    PT Stop Time 1235    PT Time Calculation (min) 50 min    Activity Tolerance Patient tolerated treatment well          Past Medical History:  Diagnosis Date   Asthma    Essential hypertension, benign 08/28/2015   GERD (gastroesophageal reflux disease)    Past Surgical History:  Procedure Laterality Date   ABDOMINAL HYSTERECTOMY     Patient Active Problem List   Diagnosis Date Noted   Fall 01/01/2024   Hypotension 09/23/2023   Lung consolidation (HCC) 09/23/2023   Abnormal CT lung screening 09/23/2023   Hot flashes 09/23/2023   Opacity of lung on imaging study 06/29/2023   Pulmonary nodule 06/29/2023   Rotator cuff tear, right 04/22/2023   Excessive daytime sleepiness 03/17/2023   Fatigue 03/17/2023   GAD (generalized anxiety disorder) 03/10/2023   Anxiety attack 03/10/2023   Atypical chest pain 02/10/2023   Chronic obstructive pulmonary disease (HCC) 02/10/2023   Mild intermittent asthma with exacerbation 02/04/2023   Pain of great toe, left 12/12/2022   Gout 09/05/2022   Great toe pain, right 09/03/2022   Pruritus 08/05/2022   Chronic allergic rhinitis 07/08/2022   Migraine without aura and without status migrainosus, not intractable 03/28/2022   Seasonal allergic rhinitis due to pollen 12/24/2021   Seasonal allergies 12/24/2021   Decreased GFR 12/24/2021   Injury of right leg 10/14/2021   Bilateral leg edema 07/30/2021   Left ear impacted  cerumen 07/30/2021   Vertigo 07/30/2021   Coronary artery calcification seen on CT scan 04/17/2021   Aortic atherosclerosis (HCC) 04/17/2021   Centrilobular emphysema (HCC) 04/17/2021   Grade I diastolic dysfunction 02/26/2021   CKD (chronic kidney disease) stage 3, GFR 30-59 ml/min (HCC) 02/20/2021   AKI (acute kidney injury) (HCC) 02/21/2020   Hyponatremia 02/20/2020   Hypocalcemia 02/20/2020   Low hemoglobin 02/20/2020   Herpes zoster with ophthalmic complication 02/03/2020   Acute intractable headache 01/31/2020   Vision changes 01/31/2020   Non-intractable vomiting 01/31/2020   Eye pain, right 01/31/2020   Plantar fasciitis, left 01/24/2020   Diarrhea 11/08/2019   OSA (obstructive sleep apnea) 09/20/2019   Hyperlipidemia 08/18/2019   Non-restorative sleep 08/17/2019   Snoring 08/17/2019   Class 2 obesity due to excess calories without serious comorbidity with body mass index (BMI) of 36.0 to 36.9 in adult 08/17/2019   Cervical spondylosis 05/10/2019   SOB (shortness of breath) on exertion 01/11/2019   Depressed mood 01/11/2019   Osteopenia 03/17/2018   Chronic venous stasis 12/22/2017   Edema of left ankle 12/22/2017   Reactive airway disease 06/09/2017   Former smoker 06/09/2017   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   Contusion 11/13/2015   Essential hypertension, benign 08/28/2015   Hiatal hernia 12/09/2013   Anxiety state 11/02/2013   GERD (gastroesophageal reflux disease) 11/02/2013    PCP: Vermell LITTIE Bologna, PA-C  REFERRING PROVIDER: Dr  Elspeth CHRISTELLA Farrow   REFERRING DIAG: R shoulder scope with RCR, SAD, DCR  THERAPY DIAG:  Acute pain of right shoulder  Abnormal posture  Other symptoms and signs involving the musculoskeletal system  Muscle weakness (generalized)  Rationale for Evaluation and Treatment: Rehabilitation  ONSET DATE: 01/06/24  SUBJECTIVE:                                                                                                                                                                                       SUBJECTIVE STATEMENT: 03/31/2024: Ana King reports that shoulder is feeling some better with less pain when arm is at side; still hurts to lift. She is having popping in the shoulder when she lifts arm up of across her body. Continues to be painful with use. She was out of town or several days and did not do much with her arm. She noticed less pain with less use of R arm. Has used arm for more functional activities in her home in the kitchen and things like making her bed. Wearing sling when outside the home but not at home. She is working on her exercises at home especially working to keep shoulders down and back as she lifts arm. That is difficulty. Pendulum feels good and decreases pain.   Post MD visit; Patient reports that the doctor is concerned with tightness in the R shoulder.   EVAL: Patient has a history of R rotator cuff tear with no known injury. Symptoms have been present since ~ 02/02/23. Patient underwent R arthroscopic RCR, DCR, SAD 01/06/24. She presents in abduction sling R UE. She has done okay since surgery. She is sleeping in her recliner and is very sedentary at this time. She has an ice machine that does some compression for home and that has helped with the pain. She sleeps with the ice machine and that helps a lot.  Hand dominance: Right  PERTINENT HISTORY: HTN; asthma; COPD; allergies; anxiety; headaches   PAIN:  Are you having pain? Yes: NPRS scale: 0/10 at rest; 6/10 increases with exercises Pain location: mid arm(deltoid area); minimal to no pain in the shoulder joint  Pain description: muscle soreness  Aggravating factors: moving in her sleep; trying the pendulum  Relieving factors: ice machine; pain meds  PRECAUTIONS: Shoulder post op per protocol     WEIGHT BEARING RESTRICTIONS: Yes   FALLS:  Has patient fallen in last 6 months? Yes. 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  DIAGNOSTIC FINDINGS:  X-ray R shoulder: 04/06/24: FINDINGS: The bones are subjectively under mineralized. There is no evidence of fracture or dislocation. The alignment is normal. The joint spaces are normal. There is no evidence of arthropathy or other focal bone abnormality. Soft tissues are unremarkable.   IMPRESSION: Subjective osteopenia/osteoporosis. Otherwise negative radiographs of the right shoulder  PATIENT SURVEYS:  Quick DASH: 84.1/100; 84.1%  03/10/24 quickdash: 52.3%      SENSATION: WFL  POSTURE: Patient presents with head forward posture with increased thoracic kyphosis; shoulders rounded and elevated   UPPER EXTREMITY ROM: R shoulder deferred due to surgery at eval     02/16/24: painful and tight end rang motion - patient with difficulty relaxing for PT ROM     03/31/24; AROM assessed w/ patient standing - note poor scapular control with elevation; PROM assessed w/ patient supine   Active/Passive ROM Right eval Left Eval AROM Right  PROM 02/16/24 R PROM 03/10/24 ROM 03/31/24  Shoulder flexion  132 91 102 deg AA (PROM limited by muscle guarding)  A: pt self demonstrates <50 deg, not formally measured Active 105 W/ scapular dyskinesis   Passive  135   Shoulder extension  47     Shoulder abduction (scaption)  124 60 60 deg PROM ; limited by muscle guarding Active 72 W/ scapular dyskinesis Passive 103  Shoulder adduction       Shoulder internal rotation  T10     Shoulder external rotation  49 30 35 deg  P/AAROM Active  49 deg Passive 45  Elbow flexion 100      Elbow extension -40      Wrist flexion WFL's      Wrist extension WFL's      Wrist ulnar deviation       Wrist radial deviation       Forearm pronation WFL's       Forearm supination WFL's      (Blank rows = not tested)  UPPER EXTREMITY MMT: deferred due to surgery       03/31/24: strength not assessed resistively   MMT Right eval Left eval  Shoulder flexion    Shoulder extension    Shoulder abduction    Shoulder adduction    Shoulder internal rotation    Shoulder external rotation    Middle trapezius    Lower trapezius    Elbow flexion    Elbow extension    Wrist flexion    Wrist extension    Wrist ulnar deviation    Wrist radial deviation    Wrist pronation    Wrist supination    Grip strength (lbs)    (Blank rows = not tested) Comments: 03/10/24 deferred given postop protocol  PALPATION:  Tightness R shoulder girdle    [facility] Adult PT Treatment:                                                DATE: 03/31/2024 Therapeutic Exercise: Standing pendulum 30 CW/30 CCW Active shoulder flexion x 5; repeated with PT providing stabilization for shoulder girdle  Active shoulder abduction with elbows flexed to 90 deg x 5  Rolling large green therapy ball forward on table to work on R shoulder flexion 10 sec hold x 5 x 2  Counter step back shoulder flexion 10 sec x 5   Manual Therapy: STM R shoulder girdle patient supine  PROM R shoulder into flexion 120(down 2 deg from last visit); scaption 90; ER at side in scapular plane 40 PT assist for scapular stability in standing as patient actively flexed UE X 10  Gentle biceps stretch sitting 20 sec x 3  Neuromuscular re-education:  Sitting with coregeous ball thoracic spine working on activation of posterior shoulder girdle  Sitting with scap squeeze with coregeous ball working on active shoulder flexion  Standing AROM shoulder ER noodle along wall with focus on activation  of posterior shoulder girdle musculature Forward prop for row with 3# DB through partial range working on posterior shoulder girdle activation and control 10 x 2  Supine scap squeeze working on activation of posterior shoulder girdle  Therapeutic Activity: Standing:  AAROM arm at side with towel under arm ER with cane x 10  Row red TB 3 sec x 10  ER red TB stepping to R 5 sec x 10  IR red TB stepping to L 5 sec x 10  Scap squeeze with noodle 10x10 Supine AROM shoulder flexion 90 deg to overhead to tolerance Supine punch to ceiling with PT assist x 10  Supine rhythmic stabilization shoulder 90 deg flexion moving through partial range flex/ext x 10  Supine rhythmic stabilization repeated with 1# wt 90 to 110 deg flex x 10   Modalities: Vaso medium pressure; 34 deg; 10 min    [facility] Adult PT Treatment:                                                DATE: 03/24/2024 Therapeutic Exercise: Standing pendulum 30 CW/30 CCW Table slide shoulder flexion 10 sec x 4 Active shoulder flexion x 5; repeated with PT providing stabilization for shoulder girdle   Active shoulder abduction with elbows flexed to 90 deg x 5  Rolling large green therapy ball forward on table to work on R shoulder flexion 10 sec hold x 5 x 2  Counter step back shoulder flexion 10 sec x 5   Manual Therapy: STM R shoulder girdle patient supine  PROM R shoulder into flexion 120(down 2 deg from last visit); scaption 90; ER at side in scapular plane 40 PT assist for scapular stability in standing as patient actively flexed UE X 10  Gentle biceps stretch sitting 20 sec x 3  Neuromuscular re-education:  Sitting with coregeous ball thoracic spine working on activation of posterior shoulder girdle  Sitting with scap squeeze with coregeous ball working on active shoulder flexion  Standing AROM shoulder ER noodle along wall with focus on activation of posterior shoulder girdle musculature Forward prop for row with 3# DB through  partial range working on posterior shoulder girdle activation and control 10 x 2  Supine scap squeeze working on activation of posterior shoulder girdle  Therapeutic Activity: Standing:  Isometric row green TB stepping back 5 sec x 10  Isometric ER green TB stepping to R 5 sec x 10  Isometric IR green TB stepping to L 5 sec x 10  Scap squeeze with noodle 10x10 Supine AAROM  shoulder flexion to tolerance, patient assisting w/ L UE x 3 Supine punch to ceiling with PT assist x 10  Supine rhythmic stabilization shoulder 90 deg flexion moving through partial range flex/ext x 10  Supine rhythmic stabilization repeated with 1# wt 90 to 110 deg flex x 10   Modalities: Vaso medium pressure; 34 deg; 10 min   [facility] Adult PT Treatment:                                                DATE: 03/22/2024 Therapeutic Exercise: Standing pendulum 30 CW/30 CCW Table slide shoulder flexion 10 sec x 4 Active shoulder flexion x 4; repeated with PR providing stabilization for shoulder girdle    Manual Therapy: STM R shoulder girdle patient supine  PROM R shoulder into flexion 120(down 2 deg from last visit); scaption 90; ER at side in scapular plane 40 PT assist for scapular stability in standing as patient actively flexed UE X 10  Neuromuscular re-education:  Sitting with coregeous ball thoracic spine working on activation of posterior shoulder girdle  Sitting with scap squeeze with coregeous ball working on active shoulder flexion  Standing AROM shoulder ER noodle along wall with focus on activation of posterior shoulder girdle musculature Forward prop for row with 2# DB through partial range working on posterior shoulder girdle activation and control 10 x 2  Supine scap squeeze working on activation of posterior shoulder girdle  Therapeutic Activity: Standing:  Bent row   Scap squeeze with noodle 10x10 Shoulder flexion step back at counter  10 sec x 5  Supine AAROM shoulder flexion to tolerance, patient  assisting w/ L UE x 3   Modalities: Vaso medium pressure; 34 deg; 10 min                                                                                                               PATIENT EDUCATION: Education details: POC; HEP Person educated: Patient Education method: Programmer, multimedia, Facilities manager, Actor cues, Verbal cues, and Handouts Education comprehension: verbalized understanding, returned demonstration, verbal cues required, tactile cues required, and needs further education  HOME EXERCISE PROGRAM: Access Code: [email or web address] URL: [email or web address] Date: 03/31/2024 Prepared by: Ariz Terrones  Exercises - Seated Scapular Retraction  - 2 x daily - 7 x weekly - 1-2 sets - 10 reps - 10 sec  hold - Seated Shoulder Shrugs  - 2 x daily - 7 x weekly - 1-2 sets - 10 reps - 2-3 sec  hold - Seated Shoulder Flexion AAROM with Pulley Behind  - 2 x daily - 7 x weekly - 1 sets - 10 reps - 10 sec  hold - Seated Shoulder Scaption AAROM with Pulley at Side  - 2 x daily - 7 x weekly - 1 sets - 10 reps - 10sec  hold - Standing Shoulder and Trunk Flexion at Table  - 2 x daily - 7 x  weekly - 1 sets - 5-10 reps - 10 sec  hold - Seated Shoulder External Rotation AAROM with Cane and Hand in Neutral  - 2 x daily - 7 x weekly - 1 sets - 5-10 reps - 5-10 sec  hold - Seated Shoulder Flexion  - 2 x daily - 7 x weekly - 1 sets - 5-10 reps - 2-3 sec  hold - Seated Shoulder Flexion Towel Slide at Table Top Full Range of Motion  - 2 x daily - 7 x weekly - 1 sets - 5-10 reps - 10sec  hold - Supine Shoulder Flexion AAROM with Hands Clasped  - 2 x daily - 7 x weekly - 1 sets - 5-10 reps - 2-3 sec  hold - Isometric Shoulder Abduction at Wall  - 2 x daily - 7 x weekly - 1 sets - 5-10 reps - 5 sec  hold - Isometric Shoulder External Rotation at Wall  - 1 x daily - 7 x weekly - 1 sets - 10 reps - 5 sec  hold - Standing Isometric Shoulder Internal Rotation with Towel Roll at Doorway  - 2 x daily - 7 x  weekly - 1 sets - 5 reps - 5 sec  hold - Isometric Shoulder Extension at Wall  - 2 x daily - 7 x weekly - 1 sets - 5-10 reps - 5 sec  hold - External Rotation Reactive Isometrics with Flex Bar  - 2 x daily - 7 x weekly - 1-2 sets - 10 reps - 3-5 sec  hold - Shoulder Internal Rotation Reactive Isometrics  - 2 x daily - 7 x weekly - 1 sets - 10 reps - 3-5 sec  hold - Supine Shoulder Rhythmic Stabilization- Horizontal Abduction/Adduction  - 2 x daily - 7 x weekly - 1 sets - 5-10 reps - Supine Shoulder Flexion AAROM with Hands Clasped  - 2 x daily - 7 x weekly - 1 sets - 5-10 reps - 2-3 sec  hold - Anterior Shoulder and Biceps Stretch  - 1 x daily - 7 x weekly - 1 sets - 3 reps - 20 sec  hold - Standing Bilateral Low Shoulder Row with Anchored Resistance  - 1 x daily - 7 x weekly - 1-3 sets - 10 reps - 2-3 sec  hold - Shoulder External Rotation with Anchored Resistance  - 1 x daily - 7 x weekly - 1-2 sets - 10 reps - 3 sec  hold - Shoulder Internal Rotation with Resistance  - 1 x daily - 7 x weekly - 1-2 sets - 10 reps - 3 sec  hold  ASSESSMENT:  CLINICAL IMPRESSION: 03/31/2024: Ana King reports continued pain and popping in the R shoulder with some improvement in both. She demonstrates poor scapular control with all postures and movements. Scapular control has improvement with scapular retraction; pulling scapulae down and back but remains weak with noted scapular dyskinesis with elevation. Working on active elevation with posterior shoulder girdle musculature engaged and supine stabilization which is tolerated well. Worked moving shoulder into increased shoulder flexion in supine and patient tolerated 1# DB will this exercise. Bent row using 3# DB. Active and passive shoulder ROM is increasing. Progressing gradually with stability and strength. Forward posture inhibits scapular stability and continues to lead to faulty shoulder mechanics.    Araiyah exhibits significant forward posture with head of the  humerus translating forward and shoulder girdle elevating as she reaches forward. When PT provides stabilization for the scapula and  assist to maintain position for movement through partial range in elevation movement is improved. She has no pain with partial range elevation when supported in more anatomically correct position. Continued with PROM; AAROM; light resistive exercises. Patient remains limited by pain and muscle guarding but demonstrates progress. She has difficulty relaxing for manual work and PROM exhibiting increased muscle guarding with PROM. Patient states that she is using R UE to perform ADLs and household tasks which are forward in nature but she is not lifting with R UE. Continued educated patient re-forward nature of most ADL's and sitting/sleeping postures and encouraged consistent postural awareness engaging posterior shoulder girdle to avoid anterior position of GH joint and scapulae.     Eval: Patient is a 73 y.o. female who was seen today for physical therapy evaluation and treatment  s/p R RCR, SAD, DCR 01/06/24 following nontraumatic complete tear of R RC. She presents decreased ROM and strength R UE, with R shoulder pain and decreased function. She has poor posture and alignment; limited cervical and shoulder ROM, mobility, strength, function. She will benefit from PT to address problems identified.   OBJECTIVE IMPAIRMENTS: decreased activity tolerance, decreased mobility, decreased ROM, decreased strength, increased fascial restrictions, increased muscle spasms, improper body mechanics, postural dysfunction, obesity, and pain.   GOALS: Goals reviewed with patient? Yes  SHORT TERM GOALS: Target date: 02/16/2024  Independent in initial HEP Baseline: Goal status: met  2.  Progress with PROM R shoulder per protocol  Baseline:  03/10/24: see ROM chart above Goal status: on going   3.  Initiate AROM exercises per protocol  Baseline:  03/10/24: not formally assessed given  pain levels w/ PROM/AAROM, pt self-demonstrates <40 deg Goal status: on going    LONG TERM GOALS: Target date: 04/21/2024 (updated 03/10/24)  Increase AROM R shoulder to WFL's as indicated per protocol  Baseline:  03/10/24: not assessed given pain levels w/ PROM/AAROM Goal status: on going   2.  4/5 to 5/5 strength R shoulder  Baseline:  03/10/24: not assessed given post op timeline Goal status: on going   3.  Patient reports use of R UE for functional activities  Baseline:  03/10/24: limited by pain and post op protocol Goal status: on going   4.  Patient reports ability to sleep without awakening due to R shoulder pain  Baseline:  03/10/24: able to sleep without waking due to pain Goal status: MET   5.  Independent in advanced HEP  Baseline:  03/10/24: reports good adherence w/ current HEP but does have some pain Goal status: on going   6.  Improve Quick DASH by 25-30%  Baseline: 84.1/100; 84.1% 03/10/24: 52.3% Goal status: on going   PLAN: (updated 03/10/24)  PT FREQUENCY: 1-2x/week  PT DURATION: 6 weeks  PLANNED INTERVENTIONS: 97164- PT Re-evaluation, 97110-Therapeutic exercises, 97530- Therapeutic activity, 97112- Neuromuscular re-education, 97535- Self Care, [id number]- Manual therapy, 97016- Vasopneumatic device, and Patient/Family education  PLAN FOR NEXT SESSION: shoulder rehab per MD protocol - add active shoulder flexion in standing; rows; isometric IR/ER; work on ROM in supine   Kyliah Deanda P. Ina PT, MPH 03/31/24 12:44 PM

## 2024-04-05 ENCOUNTER — Encounter: Payer: Self-pay | Admitting: Rehabilitative and Restorative Service Providers"

## 2024-04-05 ENCOUNTER — Ambulatory Visit: Admitting: Rehabilitative and Restorative Service Providers"

## 2024-04-05 DIAGNOSIS — R29898 Other symptoms and signs involving the musculoskeletal system: Secondary | ICD-10-CM

## 2024-04-05 DIAGNOSIS — M25511 Pain in right shoulder: Secondary | ICD-10-CM

## 2024-04-05 DIAGNOSIS — R293 Abnormal posture: Secondary | ICD-10-CM | POA: Diagnosis not present

## 2024-04-05 DIAGNOSIS — M6281 Muscle weakness (generalized): Secondary | ICD-10-CM

## 2024-04-05 NOTE — Therapy (Signed)
 OUTPATIENT PHYSICAL THERAPY SHOULDER TREATMENT   Patient Name: Ana King MRN: [id number] DOB:07/06/1951, 73 y.o., female Today's Date: 04/05/2024    END OF SESSION:  PT End of Session - 04/05/24 1047     Visit Number 16    Number of Visits 22    Date for PT Re-Evaluation 04/21/24    Authorization Type human[employer] prior auth required copay $25    Authorization Time Period 20 VISITS APPROVED FOR PT 01/12/2024-04/21/2024    Authorization - Visit Number 15    Authorization - Number of Visits 20    PT Start Time 1049    PT Stop Time 1139    PT Time Calculation (min) 50 min    Activity Tolerance Patient tolerated treatment well          Past Medical History:  Diagnosis Date   Asthma    Essential hypertension, benign 08/28/2015   GERD (gastroesophageal reflux disease)    Past Surgical History:  Procedure Laterality Date   ABDOMINAL HYSTERECTOMY     Patient Active Problem List   Diagnosis Date Noted   Fall 01/01/2024   Hypotension 09/23/2023   Lung consolidation (HCC) 09/23/2023   Abnormal CT lung screening 09/23/2023   Hot flashes 09/23/2023   Opacity of lung on imaging study 06/29/2023   Pulmonary nodule 06/29/2023   Rotator cuff tear, right 04/22/2023   Excessive daytime sleepiness 03/17/2023   Fatigue 03/17/2023   GAD (generalized anxiety disorder) 03/10/2023   Anxiety attack 03/10/2023   Atypical chest pain 02/10/2023   Chronic obstructive pulmonary disease (HCC) 02/10/2023   Mild intermittent asthma with exacerbation 02/04/2023   Pain of great toe, left 12/12/2022   Gout 09/05/2022   Great toe pain, right 09/03/2022   Pruritus 08/05/2022   Chronic allergic rhinitis 07/08/2022   Migraine without aura and without status migrainosus, not intractable 03/28/2022   Seasonal allergic rhinitis due to pollen 12/24/2021   Seasonal allergies 12/24/2021   Decreased GFR 12/24/2021   Injury of right leg 10/14/2021   Bilateral leg edema 07/30/2021   Left ear impacted  cerumen 07/30/2021   Vertigo 07/30/2021   Coronary artery calcification seen on CT scan 04/17/2021   Aortic atherosclerosis (HCC) 04/17/2021   Centrilobular emphysema (HCC) 04/17/2021   Grade I diastolic dysfunction 02/26/2021   CKD (chronic kidney disease) stage 3, GFR 30-59 ml/min (HCC) 02/20/2021   AKI (acute kidney injury) (HCC) 02/21/2020   Hyponatremia 02/20/2020   Hypocalcemia 02/20/2020   Low hemoglobin 02/20/2020   Herpes zoster with ophthalmic complication 02/03/2020   Acute intractable headache 01/31/2020   Vision changes 01/31/2020   Non-intractable vomiting 01/31/2020   Eye pain, right 01/31/2020   Plantar fasciitis, left 01/24/2020   Diarrhea 11/08/2019   OSA (obstructive sleep apnea) 09/20/2019   Hyperlipidemia 08/18/2019   Non-restorative sleep 08/17/2019   Snoring 08/17/2019   Class 2 obesity due to excess calories without serious comorbidity with body mass index (BMI) of 36.0 to 36.9 in adult 08/17/2019   Cervical spondylosis 05/10/2019   SOB (shortness of breath) on exertion 01/11/2019   Depressed mood 01/11/2019   Osteopenia 03/17/2018   Chronic venous stasis 12/22/2017   Edema of left ankle 12/22/2017   Reactive airway disease 06/09/2017   Former smoker 06/09/2017   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   Contusion 11/13/2015   Essential hypertension, benign 08/28/2015   Hiatal hernia 12/09/2013   Anxiety state 11/02/2013   GERD (gastroesophageal reflux disease) 11/02/2013    PCP: Vermell LITTIE Bologna, PA-C  REFERRING PROVIDER: Dr  Elspeth CHRISTELLA Farrow   REFERRING DIAG: R shoulder scope with RCR, SAD, DCR  THERAPY DIAG:  Acute pain of right shoulder  Abnormal posture  Other symptoms and signs involving the musculoskeletal system  Muscle weakness (generalized)  Rationale for Evaluation and Treatment: Rehabilitation  ONSET DATE: 01/06/24  SUBJECTIVE:                                                                                                                                                                                       SUBJECTIVE STATEMENT: 04/05/2024: Ana King reports that she saw her surgeon yesterday and he feels she is on track with where she is with her rehab. Shoulder is feeling some better with less pain when arm is at side; still hurts to lift. She is having popping in the shoulder when she lifts arm up of across her body. She is still using arm for more functional activities in her home in the kitchen and things like making her bed. She is working on her exercises at home especially working to keep shoulders down and back as she lifts arm.   Post MD visit; Patient reports that the doctor is concerned with tightness in the R shoulder.   EVAL: Patient has a history of R rotator cuff tear with no known injury. Symptoms have been present since ~ 02/02/23. Patient underwent R arthroscopic RCR, DCR, SAD 01/06/24. She presents in abduction sling R UE. She has done okay since surgery. She is sleeping in her recliner and is very sedentary at this time. She has an ice machine that does some compression for home and that has helped with the pain. She sleeps with the ice machine and that helps a lot.  Hand dominance: Right  PERTINENT HISTORY: HTN; asthma; COPD; allergies; anxiety; headaches   PAIN:  Are you having pain? Yes: NPRS scale: 0/10 at rest; 4-5/10 increases with exercises Pain location: mid arm(deltoid area); minimal to no pain in the shoulder joint  Pain description: muscle soreness  Aggravating factors: moving in her sleep; trying the pendulum  Relieving factors: ice machine; pain meds  PRECAUTIONS: Shoulder post op per protocol     WEIGHT BEARING RESTRICTIONS: Yes   FALLS:  Has patient fallen in last 6 months? Yes. 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  DIAGNOSTIC FINDINGS:  X-ray R shoulder: 04/06/24: FINDINGS: The bones are subjectively under mineralized. There is no evidence of fracture or dislocation. The alignment is normal. The joint spaces are normal. There is no evidence of arthropathy or other focal bone abnormality. Soft tissues are unremarkable.   IMPRESSION: Subjective osteopenia/osteoporosis. Otherwise negative radiographs of the right shoulder  PATIENT SURVEYS:  Quick DASH: 84.1/100; 84.1%  03/10/24 quickdash: 52.3%      SENSATION: WFL  POSTURE: Patient presents with head forward posture with increased thoracic kyphosis; shoulders rounded and elevated   UPPER EXTREMITY ROM: R shoulder deferred due to surgery at eval     02/16/24: painful and tight end rang motion - patient with difficulty relaxing for PT ROM     03/31/24; AROM assessed w/ patient standing - note poor scapular control with elevation; PROM assessed w/ patient supine   Active/Passive ROM Right eval Left Eval AROM Right  PROM 02/16/24 R PROM 03/10/24 ROM 03/31/24  Shoulder flexion  132 91 102 deg AA (PROM limited by muscle guarding)  A: pt self demonstrates <50 deg, not formally measured Active 105 W/ scapular dyskinesis   Passive  135   Shoulder extension  47     Shoulder abduction (scaption)  124 60 60 deg PROM ; limited by muscle guarding Active 72 W/ scapular dyskinesis Passive 103  Shoulder adduction       Shoulder internal rotation  T10     Shoulder external rotation  49 30 35 deg P/AAROM Active  49 deg Passive 45  Elbow flexion 100      Elbow extension -40      Wrist flexion WFL's      Wrist extension WFL's      Wrist ulnar  deviation       Wrist radial deviation       Forearm pronation WFL's       Forearm supination WFL's      (Blank rows = not tested)  UPPER EXTREMITY MMT: deferred due to surgery       03/31/24: strength not assessed resistively   MMT Right eval Left eval  Shoulder flexion    Shoulder extension    Shoulder abduction    Shoulder adduction    Shoulder internal rotation    Shoulder external rotation    Middle trapezius    Lower trapezius    Elbow flexion    Elbow extension    Wrist flexion    Wrist extension    Wrist ulnar deviation    Wrist radial deviation    Wrist pronation    Wrist supination    Grip strength (lbs)    (Blank rows = not tested) Comments: 03/10/24 deferred given postop protocol  PALPATION:  Tightness R shoulder girdle    [facility] Adult PT Treatment:                                                DATE: 04/05/2024 Therapeutic Exercise: Standing pendulum 30 CW/30 CCW Active shoulder flexion x 5; repeated with PT providing stabilization for shoulder girdle   Active shoulder abduction with elbows flexed to 90 deg x 5  Rolling large green therapy ball forward on table to work on R shoulder flexion 10 sec hold x  5 x 2  Counter step back shoulder flexion 10 sec x 5   Manual Therapy: STM R shoulder girdle patient supine  PROM R shoulder into flexion 120(down 2 deg from last visit); scaption 90; ER at side in scapular plane 40 Gentle biceps stretch supine 20 sec x 3  Neuromuscular re-education:  Sitting with coregeous ball thoracic spine working on activation of posterior shoulder girdle  Sitting with scap squeeze with coregeous ball working on active shoulder flexion  Standing AROM shoulder ER noodle along wall with focus on activation of posterior shoulder girdle musculature Forward prop for row with 3# DB through partial range working on posterior shoulder girdle activation and control 10 x 2  Supine scap squeeze working on activation of posterior shoulder girdle   Therapeutic Activity: Standing:  AAROM arm at side with towel under arm ER with cane x 10  Row red TB 3 sec x 10 x 2 ER red R 5 sec x 10 x 2 IR red TB stepping to L 5 sec x 10 (HEP) Scap squeeze with noodle 10x10 shoulder flexion x 10; scaption x 10 focus on scapular control  Modalities: Vaso medium pressure; 34 deg; 10 min  [facility] Adult PT Treatment:                                                DATE: 03/31/2024 Therapeutic Exercise: Standing pendulum 30 CW/30 CCW Active shoulder flexion x 5; repeated with PT providing stabilization for shoulder girdle   Active shoulder abduction with elbows flexed to 90 deg x 5  Rolling large green therapy ball forward on table to work on R shoulder flexion 10 sec hold x 5 x 2  Counter step back shoulder flexion 10 sec x 5   Manual Therapy: STM R shoulder girdle patient supine  PROM R shoulder into flexion 120(down 2 deg from last visit); scaption 90; ER at side in scapular plane 40 PT assist for scapular stability in standing as patient actively flexed UE X 10  Gentle biceps stretch sitting 20 sec x 3  Neuromuscular re-education:  Sitting with coregeous ball thoracic spine working on activation of posterior shoulder girdle  Sitting with scap squeeze with coregeous ball working on active shoulder flexion  Standing AROM shoulder ER noodle along wall with focus on activation of posterior shoulder girdle musculature Forward prop for row with 3# DB through partial range working on posterior shoulder girdle activation and control 10 x 2  Supine scap squeeze working on activation of posterior shoulder girdle  Therapeutic Activity: Standing:  AAROM arm at side with towel under arm ER with cane x 10  Row red TB 3 sec x 10  ER red TB stepping to R 5 sec x 10  IR red TB stepping to L 5 sec x 10  Scap squeeze with noodle 10x10 Supine AROM shoulder flexion 90 deg to overhead to tolerance Supine punch to ceiling with PT assist x 10  Supine rhythmic  stabilization shoulder 90 deg flexion moving through partial range flex/ext x 10  Supine rhythmic stabilization repeated with 1# wt 90 to 110 deg flex x 10   Modalities: Vaso medium pressure; 34 deg; 10 min  PATIENT EDUCATION: Education details: POC; HEP Person educated: Patient Education method: Programmer, multimedia, Demonstration, Actor cues, Verbal cues, and Handouts Education comprehension: verbalized understanding, returned demonstration, verbal cues required, tactile cues required, and needs further education  HOME EXERCISE PROGRAM: Access Code: [email or web address] URL: [email or web address] Date: 04/05/2024 Prepared by: Yulonda Wheeling  Exercises - Seated Scapular Retraction  - 2 x daily - 7 x weekly - 1-2 sets - 10 reps - 10 sec  hold - Seated Shoulder Shrugs  - 2 x daily - 7 x weekly - 1-2 sets - 10 reps - 2-3 sec  hold - Seated Shoulder Flexion AAROM with Pulley Behind  - 2 x daily - 7 x weekly - 1 sets - 10 reps - 10 sec  hold - Seated Shoulder Scaption AAROM with Pulley at Side  - 2 x daily - 7 x weekly - 1 sets - 10 reps - 10sec  hold - Standing Shoulder and Trunk Flexion at Table  - 2 x daily - 7 x weekly - 1 sets - 5-10 reps - 10 sec  hold - Seated Shoulder External Rotation AAROM with Cane and Hand in Neutral  - 2 x daily - 7 x weekly - 1 sets - 5-10 reps - 5-10 sec  hold - Seated Shoulder Flexion  - 2 x daily - 7 x weekly - 1 sets - 5-10 reps - 2-3 sec  hold - Seated Shoulder Flexion Towel Slide at Table Top Full Range of Motion  - 2 x daily - 7 x weekly - 1 sets - 5-10 reps - 10sec  hold - Supine Shoulder Flexion AAROM with Hands Clasped  - 2 x daily - 7 x weekly - 1 sets - 5-10 reps - 2-3 sec  hold - Isometric Shoulder Abduction at Wall  - 2 x daily - 7 x weekly - 1 sets - 5-10 reps - 5 sec  hold - Isometric Shoulder External Rotation at Wall  - 1 x daily - 7 x weekly - 1  sets - 10 reps - 5 sec  hold - Standing Isometric Shoulder Internal Rotation with Towel Roll at Doorway  - 2 x daily - 7 x weekly - 1 sets - 5 reps - 5 sec  hold - Isometric Shoulder Extension at Wall  - 2 x daily - 7 x weekly - 1 sets - 5-10 reps - 5 sec  hold - External Rotation Reactive Isometrics with Flex Bar  - 2 x daily - 7 x weekly - 1-2 sets - 10 reps - 3-5 sec  hold - Shoulder Internal Rotation Reactive Isometrics  - 2 x daily - 7 x weekly - 1 sets - 10 reps - 3-5 sec  hold - Supine Shoulder Rhythmic Stabilization- Horizontal Abduction/Adduction  - 2 x daily - 7 x weekly - 1 sets - 5-10 reps - Supine Shoulder Flexion AAROM with Hands Clasped  - 2 x daily - 7 x weekly - 1 sets - 5-10 reps - 2-3 sec  hold - Anterior Shoulder and Biceps Stretch  - 1 x daily - 7 x weekly - 1 sets - 3 reps - 20 sec  hold - Standing Bilateral Low Shoulder Row with Anchored Resistance  - 1 x daily - 7 x weekly - 1-3 sets - 10 reps - 2-3 sec  hold - Shoulder External Rotation with Anchored Resistance  - 1 x daily - 7 x weekly - 1-2 sets - 10 reps - 3 sec  hold - Shoulder Internal  Rotation with Resistance  - 1 x daily - 7 x weekly - 1-2 sets - 10 reps - 3 sec  hold - Bicep Stretch at Table  - 1 x daily - 7 x weekly - 1 sets - 3 reps - 30 sec  hold - Shoulder External Rotation and Scapular Retraction with Resistance  - 2 x daily - 7 x weekly - 1 sets - 10 reps - 3-5 sec  hold - Standing Bilateral Low Shoulder Row with Anchored Resistance  - 2 x daily - 7 x weekly - 1-3 sets - 10 reps - 2-3 sec  hold - Shoulder External Rotation with Anchored Resistance  - 2 x daily - 7 x weekly - 1-2 sets - 10 reps - 3 sec  hold  ASSESSMENT:  CLINICAL IMPRESSION: 04/05/2024: Ana King reports MD felt her progress was on track. She has continued pain and popping in the R shoulder with some improvement in both. She demonstrates poor scapular control with all postures and movements. Scapular control has improvement but scapular  retraction remains weak with noted scapular dyskinesis with elevation. Added gentle stretch for biceps and pecs. Added additional TB strengthening exercises. Progressing gradually with stability and strength. Forward posture inhibits scapular stability and continues to lead to faulty shoulder mechanics.    [place] exhibits significant forward posture with head of the humerus translating forward and shoulder girdle elevating as she reaches forward. When PT provides stabilization for the scapula and assist to maintain position for movement through partial range in elevation movement is improved. She has no pain with partial range elevation when supported in more anatomically correct position. Continued with PROM; AAROM; light resistive exercises. Patient remains limited by pain and muscle guarding but demonstrates progress. She has difficulty relaxing for manual work and PROM exhibiting increased muscle guarding with PROM. Patient states that she is using R UE to perform ADLs and household tasks which are forward in nature but she is not lifting with R UE. Continued educated patient re-forward nature of most ADL's and sitting/sleeping postures and encouraged consistent postural awareness engaging posterior shoulder girdle to avoid anterior position of GH joint and scapulae.     Eval: Patient is a 73 y.o. female who was seen today for physical therapy evaluation and treatment  s/p R RCR, SAD, DCR 01/06/24 following nontraumatic complete tear of R RC. She presents decreased ROM and strength R UE, with R shoulder pain and decreased function. She has poor posture and alignment; limited cervical and shoulder ROM, mobility, strength, function. She will benefit from PT to address problems identified.   OBJECTIVE IMPAIRMENTS: decreased activity tolerance, decreased mobility, decreased ROM, decreased strength, increased fascial restrictions, increased muscle spasms, improper body mechanics, postural dysfunction,  obesity, and pain.   GOALS: Goals reviewed with patient? Yes  SHORT TERM GOALS: Target date: 02/16/2024  Independent in initial HEP Baseline: Goal status: met  2.  Progress with PROM R shoulder per protocol  Baseline:  03/10/24: see ROM chart above Goal status: on going   3.  Initiate AROM exercises per protocol  Baseline:  03/10/24: not formally assessed given pain levels w/ PROM/AAROM, pt self-demonstrates <40 deg Goal status: on going    LONG TERM GOALS: Target date: 04/21/2024 (updated 03/10/24)  Increase AROM R shoulder to WFL's as indicated per protocol  Baseline:  03/10/24: not assessed given pain levels w/ PROM/AAROM Goal status: on going   2.  4/5 to 5/5 strength R shoulder  Baseline:  03/10/24: not assessed given post  op timeline Goal status: on going   3.  Patient reports use of R UE for functional activities  Baseline:  03/10/24: limited by pain and post op protocol Goal status: on going   4.  Patient reports ability to sleep without awakening due to R shoulder pain  Baseline:  03/10/24: able to sleep without waking due to pain Goal status: MET   5.  Independent in advanced HEP  Baseline:  03/10/24: reports good adherence w/ current HEP but does have some pain Goal status: on going   6.  Improve Quick DASH by 25-30%  Baseline: 84.1/100; 84.1% 03/10/24: 52.3% Goal status: on going   PLAN: (updated 03/10/24)  PT FREQUENCY: 1-2x/week  PT DURATION: 6 weeks  PLANNED INTERVENTIONS: 97164- PT Re-evaluation, 97110-Therapeutic exercises, 97530- Therapeutic activity, 97112- Neuromuscular re-education, 97535- Self Care, [id number]- Manual therapy, 97016- Vasopneumatic device, and Patient/Family education  PLAN FOR NEXT SESSION: shoulder rehab per MD protocol - add active shoulder flexion in standing; rows; isometric IR/ER; work on ROM in supine   Ninamarie Keel P. Ina PT, MPH 04/05/24 10:50 AM

## 2024-04-07 ENCOUNTER — Encounter: Payer: Self-pay | Admitting: Rehabilitative and Restorative Service Providers"

## 2024-04-07 ENCOUNTER — Ambulatory Visit: Admitting: Rehabilitative and Restorative Service Providers"

## 2024-04-07 DIAGNOSIS — M6281 Muscle weakness (generalized): Secondary | ICD-10-CM | POA: Diagnosis not present

## 2024-04-07 DIAGNOSIS — M25511 Pain in right shoulder: Secondary | ICD-10-CM

## 2024-04-07 DIAGNOSIS — R293 Abnormal posture: Secondary | ICD-10-CM | POA: Diagnosis not present

## 2024-04-07 DIAGNOSIS — R29898 Other symptoms and signs involving the musculoskeletal system: Secondary | ICD-10-CM | POA: Diagnosis not present

## 2024-04-07 NOTE — Therapy (Signed)
 OUTPATIENT PHYSICAL THERAPY SHOULDER TREATMENT   Patient Name: Ana King MRN: [id number] DOB:1950/12/29, 73 y.o., female Today's Date: 04/07/2024    END OF SESSION:  PT End of Session - 04/07/24 1056     Visit Number 16    Number of Visits 22    Date for Recertification  04/21/24    Authorization Type [employer] prior auth required copay $25    Authorization Time Period 20 VISITS APPROVED FOR PT 01/12/2024-04/21/2024    Authorization - Visit Number 16    Authorization - Number of Visits 20    Progress Note Due on Visit 20    PT Start Time 1057    PT Stop Time 1147    PT Time Calculation (min) 50 min          Past Medical History:  Diagnosis Date   Asthma    Essential hypertension, benign 08/28/2015   GERD (gastroesophageal reflux disease)    Past Surgical History:  Procedure Laterality Date   ABDOMINAL HYSTERECTOMY     Patient Active Problem List   Diagnosis Date Noted   Fall 01/01/2024   Hypotension 09/23/2023   Lung consolidation (HCC) 09/23/2023   Abnormal CT lung screening 09/23/2023   Hot flashes 09/23/2023   Opacity of lung on imaging study 06/29/2023   Pulmonary nodule 06/29/2023   Rotator cuff tear, right 04/22/2023   Excessive daytime sleepiness 03/17/2023   Fatigue 03/17/2023   GAD (generalized anxiety disorder) 03/10/2023   Anxiety attack 03/10/2023   Atypical chest pain 02/10/2023   Chronic obstructive pulmonary disease (HCC) 02/10/2023   Mild intermittent asthma with exacerbation 02/04/2023   Pain of great toe, left 12/12/2022   Gout 09/05/2022   Great toe pain, right 09/03/2022   Pruritus 08/05/2022   Chronic allergic rhinitis 07/08/2022   Migraine without aura and without status migrainosus, not intractable 03/28/2022   Seasonal allergic rhinitis due to pollen 12/24/2021   Seasonal allergies 12/24/2021   Decreased GFR 12/24/2021   Injury of right leg 10/14/2021   Bilateral leg edema 07/30/2021   Left ear impacted cerumen 07/30/2021    Vertigo 07/30/2021   Coronary artery calcification seen on CT scan 04/17/2021   Aortic atherosclerosis (HCC) 04/17/2021   Centrilobular emphysema (HCC) 04/17/2021   Grade I diastolic dysfunction 02/26/2021   CKD (chronic kidney disease) stage 3, GFR 30-59 ml/min (HCC) 02/20/2021   AKI (acute kidney injury) (HCC) 02/21/2020   Hyponatremia 02/20/2020   Hypocalcemia 02/20/2020   Low hemoglobin 02/20/2020   Herpes zoster with ophthalmic complication 02/03/2020   Acute intractable headache 01/31/2020   Vision changes 01/31/2020   Non-intractable vomiting 01/31/2020   Eye pain, right 01/31/2020   Plantar fasciitis, left 01/24/2020   Diarrhea 11/08/2019   OSA (obstructive sleep apnea) 09/20/2019   Hyperlipidemia 08/18/2019   Non-restorative sleep 08/17/2019   Snoring 08/17/2019   Class 2 obesity due to excess calories without serious comorbidity with body mass index (BMI) of 36.0 to 36.9 in adult 08/17/2019   Cervical spondylosis 05/10/2019   SOB (shortness of breath) on exertion 01/11/2019   Depressed mood 01/11/2019   Osteopenia 03/17/2018   Chronic venous stasis 12/22/2017   Edema of left ankle 12/22/2017   Reactive airway disease 06/09/2017   Former smoker 06/09/2017   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   Contusion 11/13/2015   Essential hypertension, benign 08/28/2015   Hiatal hernia 12/09/2013   Anxiety state 11/02/2013   GERD (gastroesophageal reflux disease) 11/02/2013    PCP: Vermell LITTIE Bologna, PA-C  REFERRING PROVIDER: Dr  Elspeth CHRISTELLA Farrow   REFERRING DIAG: R shoulder scope with RCR, SAD, DCR  THERAPY DIAG:  Acute pain of right shoulder  Abnormal posture  Other symptoms and signs involving the musculoskeletal system  Muscle weakness (generalized)  Rationale for Evaluation and Treatment: Rehabilitation  ONSET DATE: 01/06/24  SUBJECTIVE:                                                                                                                                                                                       SUBJECTIVE STATEMENT: 04/07/2024: Ana King reports that she used her arm for more activities yesterday when decorating her home for fall. She didn't lift anything heavy but she was moving and using arm for more activities than usual. Her shoulder is hurting more today. She still continued with her exercises and used heat on her shoulder last night. Shoulder still hurts to lift and she is having popping in the shoulder when she lifts arm up or across her body. She is still using arm for more functional activities in her home in the kitchen and things like making her bed. She is working on her exercises at home especially working to keep shoulders down and back as she lifts arm.   Post MD visit; Patient reports that the doctor is concerned with tightness in the R shoulder.   EVAL: Patient has a history of R rotator cuff tear with no known injury. Symptoms have been present since ~ 02/02/23. Patient underwent R arthroscopic RCR, DCR, SAD 01/06/24. She presents in abduction sling R UE. She has done okay since surgery. She is sleeping in her recliner and is very sedentary at this time. She has an ice machine that does some compression for home and that has helped with the pain. She sleeps with the ice machine and that helps a lot.  Hand dominance: Right  PERTINENT HISTORY: HTN; asthma; COPD; allergies; anxiety; headaches   PAIN:  Are you having pain? Yes: NPRS scale: 5/10 at rest; 4-5/10 increases with exercises Pain location: mid arm(deltoid area); minimal to no pain in the shoulder joint  Pain description: muscle soreness  Aggravating factors: moving in her sleep; trying the pendulum  Relieving factors: ice machine; pain meds  PRECAUTIONS: Shoulder post op per protocol     WEIGHT BEARING RESTRICTIONS: Yes   FALLS:  Has patient fallen in last 6 months? Yes. 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  DIAGNOSTIC FINDINGS:  X-ray R shoulder: 04/06/24: FINDINGS: The bones are subjectively under mineralized. There is no evidence of fracture or dislocation. The alignment is normal. The joint spaces are normal. There is no evidence of arthropathy or other focal bone abnormality. Soft tissues are unremarkable.   IMPRESSION: Subjective osteopenia/osteoporosis. Otherwise negative radiographs of the right shoulder  PATIENT SURVEYS:  Quick DASH: 84.1/100; 84.1%  03/10/24 quickdash: 52.3%      SENSATION: WFL  POSTURE: Patient presents with head forward posture with increased thoracic kyphosis; shoulders rounded and elevated   UPPER EXTREMITY ROM: R shoulder deferred due to surgery at eval     02/16/24: painful and tight end rang motion - patient with difficulty relaxing for PT ROM     03/31/24; AROM assessed w/ patient standing - note poor scapular control with elevation; PROM assessed w/ patient supine   Active/Passive ROM Right eval Left Eval AROM Right  PROM 02/16/24 R PROM 03/10/24 ROM 03/31/24  Shoulder flexion  132 91 102 deg AA (PROM limited by muscle guarding)  A: pt self demonstrates <50 deg, not formally measured Active 105 W/ scapular dyskinesis   Passive  135   Shoulder extension  47     Shoulder abduction (scaption)  124 60 60 deg PROM ; limited by muscle guarding Active 72 W/ scapular dyskinesis Passive 103  Shoulder adduction       Shoulder internal rotation  T10     Shoulder external rotation  49 30 35 deg P/AAROM Active  49  deg Passive 45  Elbow flexion 100      Elbow extension -40      Wrist flexion WFL's      Wrist extension WFL's      Wrist ulnar deviation       Wrist radial deviation       Forearm pronation WFL's       Forearm supination WFL's      (Blank rows = not tested)  UPPER EXTREMITY MMT: deferred due to surgery       03/31/24: strength not assessed resistively   MMT Right eval Left eval  Shoulder flexion    Shoulder extension    Shoulder abduction    Shoulder adduction    Shoulder internal rotation    Shoulder external rotation    Middle trapezius    Lower trapezius    Elbow flexion    Elbow extension    Wrist flexion    Wrist extension    Wrist ulnar deviation    Wrist radial deviation    Wrist pronation    Wrist supination    Grip strength (lbs)    (Blank rows = not tested) Comments: 03/10/24 deferred given postop protocol  PALPATION:  Tightness R shoulder girdle   [facility] Adult PT Treatment:                                                DATE: 04/07/2024 Therapeutic Exercise: Standing pendulum 30 CW/30 CCW Active shoulder flexion x 5; repeated with PT providing stabilization for shoulder girdle   Active shoulder abduction with elbows flexed to 90 deg  x 5  Rolling large green therapy ball forward on table to work on R shoulder flexion 10 sec hold x 5 x 2  Counter step back shoulder flexion 10 sec x 5   Manual Therapy: STM R shoulder girdle patient supine  PROM R shoulder into flexion 120(down 2 deg from last visit); scaption 90; ER at side in scapular plane 40 Gentle biceps stretch supine 20 sec x 3  Neuromuscular re-education:  Sitting with coregeous ball thoracic spine working on activation of posterior shoulder girdle  Sitting with scap squeeze with coregeous ball working on active shoulder flexion  Standing AROM shoulder ER noodle along wall with focus on activation of posterior shoulder girdle musculature Forward prop for row with 3# DB through partial range working  on posterior shoulder girdle activation and control 10 x 2  Supine scap squeeze working on activation of posterior shoulder girdle  Therapeutic Activity: Standing:  AAROM arm at side with towel under arm ER with cane x 10  Row red TB 3 sec x 10 x 2 ER red R 5 sec x 10 x 2 IR red TB stepping to L 5 sec x 10 (HEP) Scap squeeze with noodle 10x10 shoulder flexion x 10; scaption x 10 focus on scapular control  Modalities: Vaso medium pressure; 34 deg; 10 min   [facility] Adult PT Treatment:                                                DATE: 04/05/2024 Therapeutic Exercise: Standing pendulum 30 CW/30 CCW Active shoulder flexion x 5; repeated with PT providing stabilization for shoulder girdle   Active shoulder abduction with elbows flexed to 90 deg x 5  Rolling large green therapy ball forward on table to work on R shoulder flexion 10 sec hold x 5 x 2  Counter step back shoulder flexion 10 sec x 5   Manual Therapy: STM R shoulder girdle patient supine  PROM R shoulder into flexion 120(down 2 deg from last visit); scaption 90; ER at side in scapular plane 40 Gentle biceps stretch supine 20 sec x 3  Neuromuscular re-education:  Sitting with coregeous ball thoracic spine working on activation of posterior shoulder girdle  Sitting with scap squeeze with coregeous ball working on active shoulder flexion  Standing AROM shoulder ER noodle along wall with focus on activation of posterior shoulder girdle musculature Forward prop for row with 3# DB through partial range working on posterior shoulder girdle activation and control 10 x 2  Supine scap squeeze working on activation of posterior shoulder girdle  Therapeutic Activity: Standing:  AAROM arm at side with towel under arm ER with cane x 10  Row red TB 3 sec x 10 x 2 ER red R 5 sec x 10 x 2 IR red TB stepping to L 5 sec x 10 (HEP) Scap squeeze with noodle 10x10 shoulder flexion x 10; scaption x 10 focus on scapular  control  Modalities: Vaso medium pressure; 34 deg; 10 min  PATIENT EDUCATION: Education details: POC; HEP Person educated: Patient Education method: Programmer, multimedia, Demonstration, Actor cues, Verbal cues, and Handouts Education comprehension: verbalized understanding, returned demonstration, verbal cues required, tactile cues required, and needs further education  HOME EXERCISE PROGRAM: Access Code: [email or web address] URL: [email or web address] Date: 04/05/2024 Prepared by: Tomislav Micale  Exercises - Seated Scapular Retraction  - 2 x daily - 7 x weekly - 1-2 sets - 10 reps - 10 sec  hold - Seated Shoulder Shrugs  - 2 x daily - 7 x weekly - 1-2 sets - 10 reps - 2-3 sec  hold - Seated Shoulder Flexion AAROM with Pulley Behind  - 2 x daily - 7 x weekly - 1 sets - 10 reps - 10 sec  hold - Seated Shoulder Scaption AAROM with Pulley at Side  - 2 x daily - 7 x weekly - 1 sets - 10 reps - 10sec  hold - Standing Shoulder and Trunk Flexion at Table  - 2 x daily - 7 x weekly - 1 sets - 5-10 reps - 10 sec  hold - Seated Shoulder External Rotation AAROM with Cane and Hand in Neutral  - 2 x daily - 7 x weekly - 1 sets - 5-10 reps - 5-10 sec  hold - Seated Shoulder Flexion  - 2 x daily - 7 x weekly - 1 sets - 5-10 reps - 2-3 sec  hold - Seated Shoulder Flexion Towel Slide at Table Top Full Range of Motion  - 2 x daily - 7 x weekly - 1 sets - 5-10 reps - 10sec  hold - Supine Shoulder Flexion AAROM with Hands Clasped  - 2 x daily - 7 x weekly - 1 sets - 5-10 reps - 2-3 sec  hold - Isometric Shoulder Abduction at Wall  - 2 x daily - 7 x weekly - 1 sets - 5-10 reps - 5 sec  hold - Isometric Shoulder External Rotation at Wall  - 1 x daily - 7 x weekly - 1 sets - 10 reps - 5 sec  hold - Standing Isometric Shoulder Internal Rotation with Towel Roll at Doorway  - 2 x daily - 7 x weekly - 1 sets - 5 reps -  5 sec  hold - Isometric Shoulder Extension at Wall  - 2 x daily - 7 x weekly - 1 sets - 5-10 reps - 5 sec  hold - External Rotation Reactive Isometrics with Flex Bar  - 2 x daily - 7 x weekly - 1-2 sets - 10 reps - 3-5 sec  hold - Shoulder Internal Rotation Reactive Isometrics  - 2 x daily - 7 x weekly - 1 sets - 10 reps - 3-5 sec  hold - Supine Shoulder Rhythmic Stabilization- Horizontal Abduction/Adduction  - 2 x daily - 7 x weekly - 1 sets - 5-10 reps - Supine Shoulder Flexion AAROM with Hands Clasped  - 2 x daily - 7 x weekly - 1 sets - 5-10 reps - 2-3 sec  hold - Anterior Shoulder and Biceps Stretch  - 1 x daily - 7 x weekly - 1 sets - 3 reps - 20 sec  hold - Standing Bilateral Low Shoulder Row with Anchored Resistance  - 1 x daily - 7 x weekly - 1-3 sets - 10 reps - 2-3 sec  hold - Shoulder External Rotation with Anchored Resistance  - 1 x daily - 7 x weekly - 1-2 sets - 10 reps - 3 sec  hold - Shoulder Internal  Rotation with Resistance  - 1 x daily - 7 x weekly - 1-2 sets - 10 reps - 3 sec  hold - Bicep Stretch at Table  - 1 x daily - 7 x weekly - 1 sets - 3 reps - 30 sec  hold - Shoulder External Rotation and Scapular Retraction with Resistance  - 2 x daily - 7 x weekly - 1 sets - 10 reps - 3-5 sec  hold - Standing Bilateral Low Shoulder Row with Anchored Resistance  - 2 x daily - 7 x weekly - 1-3 sets - 10 reps - 2-3 sec  hold - Shoulder External Rotation with Anchored Resistance  - 2 x daily - 7 x weekly - 1-2 sets - 10 reps - 3 sec  hold  ASSESSMENT:  CLINICAL IMPRESSION: 04/07/2024: Ana King reports some increase in shoulder pain today from activities at home yesterday. She has continued pain and popping in the R shoulder with elevation and adduction. She demonstrates poor scapular control with all postures and movements. Scapular control has improvement but scapular retraction remains weak with noted scapular dyskinesis with elevation. Continued gentle stretch for biceps and pecs and TB  strengthening exercises. No new exercises today. Progressing gradually with stability and strength. Forward posture inhibits scapular stability and continues to lead to faulty shoulder mechanics.    Ana King exhibits significant forward posture with head of the humerus translating forward and shoulder girdle elevating as she reaches forward. When PT provides stabilization for the scapula and assist to maintain position for movement through partial range in elevation movement is improved. She has no pain with partial range elevation when supported in more anatomically correct position. Continued with PROM; AAROM; light resistive exercises. Patient remains limited by pain and muscle guarding but demonstrates progress. She has difficulty relaxing for manual work and PROM exhibiting increased muscle guarding with PROM. Patient states that she is using R UE to perform ADLs and household tasks which are forward in nature but she is not lifting with R UE. Continued educated patient re-forward nature of most ADL's and sitting/sleeping postures and encouraged consistent postural awareness engaging posterior shoulder girdle to avoid anterior position of GH joint and scapulae.     Eval: Patient is a 73 y.o. female who was seen today for physical therapy evaluation and treatment  s/p R RCR, SAD, DCR 01/06/24 following nontraumatic complete tear of R RC. She presents decreased ROM and strength R UE, with R shoulder pain and decreased function. She has poor posture and alignment; limited cervical and shoulder ROM, mobility, strength, function. She will benefit from PT to address problems identified.   OBJECTIVE IMPAIRMENTS: decreased activity tolerance, decreased mobility, decreased ROM, decreased strength, increased fascial restrictions, increased muscle spasms, improper body mechanics, postural dysfunction, obesity, and pain.   GOALS: Goals reviewed with patient? Yes  SHORT TERM GOALS: Target date:  02/16/2024  Independent in initial HEP Baseline: Goal status: met  2.  Progress with PROM R shoulder per protocol  Baseline:  03/10/24: see ROM chart above Goal status: on going   3.  Initiate AROM exercises per protocol  Baseline:  03/10/24: not formally assessed given pain levels w/ PROM/AAROM, pt self-demonstrates <40 deg Goal status: on going    LONG TERM GOALS: Target date: 04/21/2024 (updated 03/10/24)  Increase AROM R shoulder to WFL's as indicated per protocol  Baseline:  03/10/24: not assessed given pain levels w/ PROM/AAROM Goal status: on going   2.  4/5 to 5/5 strength R shoulder  Baseline:  03/10/24: not assessed given post op timeline Goal status: on going   3.  Patient reports use of R UE for functional activities  Baseline:  03/10/24: limited by pain and post op protocol Goal status: on going   4.  Patient reports ability to sleep without awakening due to R shoulder pain  Baseline:  03/10/24: able to sleep without waking due to pain Goal status: MET   5.  Independent in advanced HEP  Baseline:  03/10/24: reports good adherence w/ current HEP but does have some pain Goal status: on going   6.  Improve Quick DASH by 25-30%  Baseline: 84.1/100; 84.1% 03/10/24: 52.3% Goal status: on going   PLAN: (updated 03/10/24)  PT FREQUENCY: 1-2x/week  PT DURATION: 6 weeks  PLANNED INTERVENTIONS: 97164- PT Re-evaluation, 97110-Therapeutic exercises, 97530- Therapeutic activity, 97112- Neuromuscular re-education, 97535- Self Care, [id number]- Manual therapy, 97016- Vasopneumatic device, and Patient/Family education  PLAN FOR NEXT SESSION: shoulder rehab per MD protocol - add active shoulder flexion in standing; rows; isometric IR/ER; work on ROM in supine   Garan Frappier P. Ina PT, MPH 04/07/24 10:57 AM

## 2024-04-13 ENCOUNTER — Encounter: Payer: Self-pay | Admitting: Physician Assistant

## 2024-04-14 ENCOUNTER — Ambulatory Visit: Admitting: Rehabilitative and Restorative Service Providers"

## 2024-04-14 ENCOUNTER — Encounter: Payer: Self-pay | Admitting: Rehabilitative and Restorative Service Providers"

## 2024-04-14 DIAGNOSIS — M25511 Pain in right shoulder: Secondary | ICD-10-CM

## 2024-04-14 DIAGNOSIS — M6281 Muscle weakness (generalized): Secondary | ICD-10-CM | POA: Diagnosis not present

## 2024-04-14 DIAGNOSIS — R293 Abnormal posture: Secondary | ICD-10-CM

## 2024-04-14 DIAGNOSIS — R29898 Other symptoms and signs involving the musculoskeletal system: Secondary | ICD-10-CM | POA: Diagnosis not present

## 2024-04-14 NOTE — Therapy (Signed)
 OUTPATIENT PHYSICAL THERAPY SHOULDER TREATMENT   Patient Name: Ana King MRN: [id number] DOB:[birth year], 73 y.o., female Today's Date: 04/14/2024    END OF SESSION:  PT End of Session - 04/14/24 0850     Visit Number 17    Number of Visits 22    Date for Recertification  04/21/24    Authorization Type [employer] prior auth required copay $25    Authorization Time Period 20 VISITS APPROVED FOR PT 01/12/2024-04/21/2024    Authorization - Visit Number 17    Authorization - Number of Visits 20    Progress Note Due on Visit 20    PT Start Time 0845    PT Stop Time 0940    PT Time Calculation (min) 55 min    Activity Tolerance Patient tolerated treatment well          Past Medical History:  Diagnosis Date   Asthma    Essential hypertension, benign 08/28/2015   GERD (gastroesophageal reflux disease)    Past Surgical History:  Procedure Laterality Date   ABDOMINAL HYSTERECTOMY     Patient Active Problem List   Diagnosis Date Noted   Fall 01/01/2024   Hypotension 09/23/2023   Lung consolidation 09/23/2023   Abnormal CT lung screening 09/23/2023   Hot flashes 09/23/2023   Opacity of lung on imaging study 06/29/2023   Pulmonary nodule 06/29/2023   Rotator cuff tear, right 04/22/2023   Excessive daytime sleepiness 03/17/2023   Fatigue 03/17/2023   GAD (generalized anxiety disorder) 03/10/2023   Anxiety attack 03/10/2023   Atypical chest pain 02/10/2023   Chronic obstructive pulmonary disease (HCC) 02/10/2023   Mild intermittent asthma with exacerbation 02/04/2023   Pain of great toe, left 12/12/2022   Gout 09/05/2022   Great toe pain, right 09/03/2022   Pruritus 08/05/2022   Chronic allergic rhinitis 07/08/2022   Migraine without aura and without status migrainosus, not intractable 03/28/2022   Seasonal allergic rhinitis due to pollen 12/24/2021   Seasonal allergies 12/24/2021   Decreased GFR 12/24/2021   Injury of right leg 10/14/2021   Bilateral leg edema  07/30/2021   Left ear impacted cerumen 07/30/2021   Vertigo 07/30/2021   Coronary artery calcification seen on CT scan 04/17/2021   Aortic atherosclerosis 04/17/2021   Centrilobular emphysema (HCC) 04/17/2021   Grade I diastolic dysfunction 02/26/2021   CKD (chronic kidney disease) stage 3, GFR 30-59 ml/min (HCC) 02/20/2021   AKI (acute kidney injury) 02/21/2020   Hyponatremia 02/20/2020   Hypocalcemia 02/20/2020   Low hemoglobin 02/20/2020   Herpes zoster with ophthalmic complication 02/03/2020   Acute intractable headache 01/31/2020   Vision changes 01/31/2020   Non-intractable vomiting 01/31/2020   Eye pain, right 01/31/2020   Plantar fasciitis, left 01/24/2020   Diarrhea 11/08/2019   OSA (obstructive sleep apnea) 09/20/2019   Hyperlipidemia 08/18/2019   Non-restorative sleep 08/17/2019   Snoring 08/17/2019   Class 2 obesity due to excess calories without serious comorbidity with body mass index (BMI) of 36.0 to 36.9 in adult 08/17/2019   Cervical spondylosis 05/10/2019   SOB (shortness of breath) on exertion 01/11/2019   Depressed mood 01/11/2019   Osteopenia 03/17/2018   Chronic venous stasis 12/22/2017   Edema of left ankle 12/22/2017   Reactive airway disease 06/09/2017   Former smoker 06/09/2017   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   Contusion 11/13/2015   Essential hypertension, benign 08/28/2015   Hiatal hernia 12/09/2013   Anxiety state 11/02/2013   GERD (gastroesophageal reflux disease) 11/02/2013    PCP: Ana King  [name], PA-C  REFERRING PROVIDER: Dr Ana King   REFERRING DIAG: R shoulder scope with RCR, SAD, DCR  THERAPY DIAG:  Acute pain of right shoulder  Abnormal posture  Other symptoms and signs involving the musculoskeletal system  Muscle weakness (generalized)  Rationale for Evaluation and Treatment: Rehabilitation  ONSET DATE: 01/06/24  SUBJECTIVE:                                                                                                                                                                                       SUBJECTIVE STATEMENT: 04/14/2024: [name] reports that her shoulder and arm are feeling better! She is using her arm for more functional at home. She is still avoiding didn't lift anything heavy but she has been moving and using arm for more activities than usual.   Post MD visit; Patient reports that the doctor is concerned with tightness in the R shoulder.   EVAL: Patient has a history of R rotator cuff tear with no known injury. Symptoms have been present since ~ 02/02/23. Patient underwent R arthroscopic RCR, DCR, SAD 01/06/24. She presents in abduction sling R UE. She has done okay since surgery. She is sleeping in her recliner and is very sedentary at this time. She has an ice machine that does some compression for home and that has helped with the pain. She sleeps with the ice machine and that helps a lot.  Hand dominance: Right  PERTINENT HISTORY: HTN; asthma; COPD; allergies; anxiety; headaches   PAIN:  Are you having pain? Yes: NPRS scale: 0/10 at rest; 4-5/10 increases with exercises Pain location: mid arm(deltoid area); minimal to no pain in the shoulder joint  Pain description: muscle soreness  Aggravating factors: moving in her sleep; trying the pendulum  Relieving factors: ice machine; pain meds  PRECAUTIONS: Shoulder post op per protocol     WEIGHT BEARING RESTRICTIONS: Yes   FALLS:  Has patient fallen in last 6 months? Yes. 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  DIAGNOSTIC FINDINGS:  X-ray R shoulder: 04/06/24: FINDINGS: The bones are subjectively under mineralized. There is no evidence of fracture or dislocation. The alignment is normal. The joint  spaces are normal. There is no evidence of arthropathy or other focal bone abnormality. Soft tissues are unremarkable.   IMPRESSION: Subjective osteopenia/osteoporosis. Otherwise negative radiographs of the right shoulder  PATIENT SURVEYS:  Quick DASH: 84.1/100; 84.1%  03/10/24 quickdash: 52.3%      SENSATION: WFL  POSTURE: Patient presents with head forward posture with increased thoracic kyphosis; shoulders rounded and elevated   UPPER EXTREMITY ROM: R shoulder deferred due to surgery at eval     02/16/24: painful and tight end rang motion - patient with difficulty relaxing for PT ROM     03/31/24; AROM assessed w/ patient standing - note poor scapular control with elevation; PROM assessed w/ patient supine   Active/Passive ROM Right eval Left Eval AROM Right  PROM 02/16/24 R PROM 03/10/24 ROM 03/31/24  Shoulder flexion  132 91 102 deg AA (PROM limited by muscle guarding)  A: pt self demonstrates <50 deg, not formally measured Active 105 W/ scapular dyskinesis   Passive  135   Shoulder extension  47     Shoulder abduction (scaption)  124 60 60 deg PROM ; limited by muscle guarding Active 72 W/ scapular dyskinesis Passive 103  Shoulder adduction       Shoulder internal rotation  T10     Shoulder external rotation  49 30 35 deg P/AAROM Active  49 deg Passive 45  Elbow flexion 100      Elbow extension -40      Wrist flexion WFL's      Wrist extension WFL's      Wrist ulnar deviation       Wrist radial deviation       Forearm pronation WFL's       Forearm supination WFL's      (Blank rows = not tested)  UPPER EXTREMITY MMT: deferred due to surgery       03/31/24: strength not assessed resistively   MMT Right eval  Left eval  Shoulder flexion    Shoulder extension    Shoulder abduction    Shoulder adduction    Shoulder internal rotation    Shoulder external rotation    Middle trapezius    Lower trapezius    Elbow flexion    Elbow extension    Wrist flexion    Wrist extension    Wrist ulnar deviation    Wrist radial deviation    Wrist pronation    Wrist supination    Grip strength (lbs)    (Blank rows = not tested) Comments: 03/10/24 deferred given postop protocol  PALPATION:  Tightness R shoulder girdle   [facility] Adult PT Treatment:                                                DATE: 04/14/2024 Therapeutic Exercise: Standing pendulum 30 CW/30 CCW Active shoulder flexion x 5; repeated with PT providing stabilization for shoulder girdle   Active shoulder abduction with elbows flexed to 90 deg x 5  Rolling large green therapy ball forward on table to work on R shoulder flexion 10 sec hold x 5 x 2  Counter step back shoulder flexion 10 sec x 5   Manual Therapy: STM R shoulder girdle patient supine  PROM R shoulder into flexion 120; scaption 90; ER at side in scapular plane 40 Gentle biceps stretch supine 20 sec x 3  Neuromuscular  re-education:  Pressing into therapeutic ball to work on scapular depression 3 sec x 10  Sitting with coregeous ball thoracic spine working on activation of posterior shoulder girdle  Sitting with scap squeeze with coregeous ball working on active shoulder flexion  Standing AROM shoulder ER noodle along wall with focus on activation of posterior shoulder girdle musculature Forward prop for row with 4# DB through partial range working on posterior shoulder girdle activation and control 10 x 2  Supine scap squeeze working on activation of posterior shoulder girdle  Therapeutic Activity: Standing:  AAROM arm at side with towel under arm ER with cane x 10  Row blue TB 3 sec x 10 x 2 ER green step out R 5 sec x 10 x 2 IR green TB stepping to L 5 sec x 10  Scap  squeeze with noodle 10x10 shoulder flexion x 10; scaption x 10 focus on scapular control Shoulder extension with cane behind back 3 sec x 5  IR cane behind back 3 sec x 5 Modalities: Vaso medium pressure; 34 deg; 10 min    [facility] Adult PT Treatment:                                                DATE: 04/07/2024 Therapeutic Exercise: Standing pendulum 30 CW/30 CCW Active shoulder flexion x 5; repeated with PT providing stabilization for shoulder girdle   Active shoulder abduction with elbows flexed to 90 deg x 5  Rolling large green therapy ball forward on table to work on R shoulder flexion 10 sec hold x 5 x 2  Counter step back shoulder flexion 10 sec x 5   Manual Therapy: STM R shoulder girdle patient supine  PROM R shoulder into flexion 120(down 2 deg from last visit); scaption 90; ER at side in scapular plane 40 Gentle biceps stretch supine 20 sec x 3  Neuromuscular re-education:  Sitting with coregeous ball thoracic spine working on activation of posterior shoulder girdle  Sitting with scap squeeze with coregeous ball working on active shoulder flexion  Standing AROM shoulder ER noodle along wall with focus on activation of posterior shoulder girdle musculature Forward prop for row with 3# DB through partial range working on posterior shoulder girdle activation and control 10 x 2  Supine scap squeeze working on activation of posterior shoulder girdle  Therapeutic Activity: Standing:  AAROM arm at side with towel under arm ER with cane x 10  Row red TB 3 sec x 10 x 2 ER red R 5 sec x 10 x 2 IR red TB stepping to L 5 sec x 10 (HEP) Scap squeeze with noodle 10x10 shoulder flexion x 10; scaption x 10 focus on scapular control  Modalities: Vaso medium pressure; 34 deg; 10 min   [facility] Adult PT Treatment:                                                DATE: 04/05/2024 Therapeutic Exercise: Standing pendulum 30 CW/30 CCW Active shoulder flexion x 5; repeated with PT providing  stabilization for shoulder girdle   Active shoulder abduction with elbows flexed to 90 deg x 5  Rolling large green therapy ball forward on table to work on R shoulder  flexion 10 sec hold x 5 x 2  Counter step back shoulder flexion 10 sec x 5   Manual Therapy: STM R shoulder girdle patient supine  PROM R shoulder into flexion 120(down 2 deg from last visit); scaption 90; ER at side in scapular plane 40 Gentle biceps stretch supine 20 sec x 3  Neuromuscular re-education:  Sitting with coregeous ball thoracic spine working on activation of posterior shoulder girdle  Sitting with scap squeeze with coregeous ball working on active shoulder flexion  Standing AROM shoulder ER noodle along wall with focus on activation of posterior shoulder girdle musculature Forward prop for row with 3# DB through partial range working on posterior shoulder girdle activation and control 10 x 2  Supine scap squeeze working on activation of posterior shoulder girdle  Therapeutic Activity: Standing:  AAROM arm at side with towel under arm ER with cane x 10  Row red TB 3 sec x 10 x 2 ER red R 5 sec x 10 x 2 IR red TB stepping to L 5 sec x 10 (HEP) Scap squeeze with noodle 10x10 shoulder flexion x 10; scaption x 10 focus on scapular control  Modalities: Vaso medium pressure; 34 deg; 10 min                                                                                                            PATIENT EDUCATION: Education details: POC; HEP Person educated: Patient Education method: Programmer, multimedia, Demonstration, Actor cues, Verbal cues, and Handouts Education comprehension: verbalized understanding, returned demonstration, verbal cues required, tactile cues required, and needs further education  HOME EXERCISE PROGRAM: Access Code: [email or web address] URL: [email or web address] Date: 04/14/2024 Prepared by: Charese Abundis  Exercises - Seated Scapular Retraction  - 2 x daily - 7 x weekly - 1-2 sets - 10  reps - 10 sec  hold - Seated Shoulder Shrugs  - 2 x daily - 7 x weekly - 1-2 sets - 10 reps - 2-3 sec  hold - Seated Shoulder Flexion AAROM with Pulley Behind  - 2 x daily - 7 x weekly - 1 sets - 10 reps - 10 sec  hold - Seated Shoulder Scaption AAROM with Pulley at Side  - 2 x daily - 7 x weekly - 1 sets - 10 reps - 10sec  hold - Standing Shoulder and Trunk Flexion at Table  - 2 x daily - 7 x weekly - 1 sets - 5-10 reps - 10 sec  hold - Seated Shoulder External Rotation AAROM with Cane and Hand in Neutral  - 2 x daily - 7 x weekly - 1 sets - 5-10 reps - 5-10 sec  hold - Seated Shoulder Flexion  - 2 x daily - 7 x weekly - 1 sets - 5-10 reps - 2-3 sec  hold - Seated Shoulder Flexion Towel Slide at Table Top Full Range of Motion  - 2 x daily - 7 x weekly - 1 sets - 5-10 reps - 10sec  hold - Supine Shoulder Flexion AAROM with Hands Clasped  - 2  x daily - 7 x weekly - 1 sets - 5-10 reps - 2-3 sec  hold - Isometric Shoulder Abduction at Wall  - 2 x daily - 7 x weekly - 1 sets - 5-10 reps - 5 sec  hold - Isometric Shoulder External Rotation at Wall  - 1 x daily - 7 x weekly - 1 sets - 10 reps - 5 sec  hold - Standing Isometric Shoulder Internal Rotation with Towel Roll at Doorway  - 2 x daily - 7 x weekly - 1 sets - 5 reps - 5 sec  hold - Isometric Shoulder Extension at Wall  - 2 x daily - 7 x weekly - 1 sets - 5-10 reps - 5 sec  hold - External Rotation Reactive Isometrics with Flex Bar  - 2 x daily - 7 x weekly - 1-2 sets - 10 reps - 3-5 sec  hold - Shoulder Internal Rotation Reactive Isometrics  - 2 x daily - 7 x weekly - 1 sets - 10 reps - 3-5 sec  hold - Supine Shoulder Rhythmic Stabilization- Horizontal Abduction/Adduction  - 2 x daily - 7 x weekly - 1 sets - 5-10 reps - Supine Shoulder Flexion AAROM with Hands Clasped  - 2 x daily - 7 x weekly - 1 sets - 5-10 reps - 2-3 sec  hold - Anterior Shoulder and Biceps Stretch  - 1 x daily - 7 x weekly - 1 sets - 3 reps - 20 sec  hold - Standing Bilateral  Low Shoulder Row with Anchored Resistance  - 1 x daily - 7 x weekly - 1-3 sets - 10 reps - 2-3 sec  hold - Shoulder External Rotation with Anchored Resistance  - 1 x daily - 7 x weekly - 1-2 sets - 10 reps - 3 sec  hold - Shoulder Internal Rotation with Resistance  - 1 x daily - 7 x weekly - 1-2 sets - 10 reps - 3 sec  hold - Bicep Stretch at Table  - 1 x daily - 7 x weekly - 1 sets - 3 reps - 30 sec  hold - Shoulder External Rotation and Scapular Retraction with Resistance  - 2 x daily - 7 x weekly - 1 sets - 10 reps - 3-5 sec  hold - Standing Bilateral Low Shoulder Row with Anchored Resistance  - 2 x daily - 7 x weekly - 1-3 sets - 10 reps - 2-3 sec  hold - Shoulder External Rotation with Anchored Resistance  - 2 x daily - 7 x weekly - 1-2 sets - 10 reps - 3 sec  hold - Standing Shoulder Extension with Dowel  - 1 x daily - 7 x weekly - 1 sets - 5-10 reps - 5-10 sec  hold - Standing Bilateral Shoulder Internal Rotation AAROM with Dowel  - 1 x daily - 7 x weekly - 1 sets - 5-10 reps - 10 sec  hold - Standing Wall [employer] with Mini Swiss Ball  - 1 x daily - 7 x weekly -   hold  ASSESSMENT:  CLINICAL IMPRESSION: 04/14/2024: [name] reports improvement with decreased pain and increased functional activity level with R UE. Continued with ROM, stabilization, strengthening exercises. Added gentle stretch for shoulder extension and IR with cane. Not improvement in scapular and shoulder posture. She demonstrates poor scapular control with all postures and movements. Scapular control has improvement but scapular retraction remains weak with noted scapular dyskinesis with elevation. Continued gentle stretch for biceps  and pecs and TB strengthening exercises. Progressed TB resistance and added ROM exercises and ball on wall stabilization. Progressing gradually with stability and strength. Forward posture inhibits scapular stability and continues to lead to faulty shoulder mechanics.    Sephira exhibits  significant forward posture with head of the humerus translating forward and shoulder girdle elevating as she reaches forward. When PT provides stabilization for the scapula and assist to maintain position for movement through partial range in elevation movement is improved. She has no pain with partial range elevation when supported in more anatomically correct position. Continued with PROM; AAROM; light resistive exercises. Patient remains limited by pain and muscle guarding but demonstrates progress. She has difficulty relaxing for manual work and PROM exhibiting increased muscle guarding with PROM. Patient states that she is using R UE to perform ADLs and household tasks which are forward in nature but she is not lifting with R UE. Continued educated patient re-forward nature of most ADL's and sitting/sleeping postures and encouraged consistent postural awareness engaging posterior shoulder girdle to avoid anterior position of GH joint and scapulae.     Eval: Patient is a 73 y.o. female who was seen today for physical therapy evaluation and treatment  s/p R RCR, SAD, DCR 01/06/24 following nontraumatic complete tear of R RC. She presents decreased ROM and strength R UE, with R shoulder pain and decreased function. She has poor posture and alignment; limited cervical and shoulder ROM, mobility, strength, function. She will benefit from PT to address problems identified.   OBJECTIVE IMPAIRMENTS: decreased activity tolerance, decreased mobility, decreased ROM, decreased strength, increased fascial restrictions, increased muscle spasms, improper body mechanics, postural dysfunction, obesity, and pain.   GOALS: Goals reviewed with patient? Yes  SHORT TERM GOALS: Target date: 02/16/2024  Independent in initial HEP Baseline: Goal status: met  2.  Progress with PROM R shoulder per protocol  Baseline:  03/10/24: see ROM chart above Goal status: on going   3.  Initiate AROM exercises per protocol   Baseline:  03/10/24: not formally assessed given pain levels w/ PROM/AAROM, pt self-demonstrates <40 deg Goal status: on going    LONG TERM GOALS: Target date: 04/21/2024 (updated 03/10/24)  Increase AROM R shoulder to WFL's as indicated per protocol  Baseline:  03/10/24: not assessed given pain levels w/ PROM/AAROM Goal status: on going   2.  4/5 to 5/5 strength R shoulder  Baseline:  03/10/24: not assessed given post op timeline Goal status: on going   3.  Patient reports use of R UE for functional activities  Baseline:  03/10/24: limited by pain and post op protocol Goal status: on going   4.  Patient reports ability to sleep without awakening due to R shoulder pain  Baseline:  03/10/24: able to sleep without waking due to pain Goal status: MET   5.  Independent in advanced HEP  Baseline:  03/10/24: reports good adherence w/ current HEP but does have some pain Goal status: on going   6.  Improve Quick DASH by 25-30%  Baseline: 84.1/100; 84.1% 03/10/24: 52.3% Goal status: on going   PLAN: (updated 03/10/24)  PT FREQUENCY: 1-2x/week  PT DURATION: 6 weeks  PLANNED INTERVENTIONS: 97164- PT Re-evaluation, 97110-Therapeutic exercises, 97530- Therapeutic activity, 97112- Neuromuscular re-education, 97535- Self Care, [id number]- Manual therapy, 97016- Vasopneumatic device, and Patient/Family education  PLAN FOR NEXT SESSION: shoulder rehab per MD protocol - add active shoulder flexion in standing; rows; isometric IR/ER; work on ROM in supine   Jaselynn Tamas P. Ina PT,  MPH 04/14/24 8:53 AM

## 2024-04-18 ENCOUNTER — Ambulatory Visit: Admitting: Rehabilitative and Restorative Service Providers"

## 2024-04-20 ENCOUNTER — Ambulatory Visit: Attending: Orthopaedic Surgery | Admitting: Rehabilitative and Restorative Service Providers"

## 2024-04-20 ENCOUNTER — Encounter: Payer: Self-pay | Admitting: Rehabilitative and Restorative Service Providers"

## 2024-04-20 DIAGNOSIS — M6281 Muscle weakness (generalized): Secondary | ICD-10-CM | POA: Diagnosis not present

## 2024-04-20 DIAGNOSIS — M25511 Pain in right shoulder: Secondary | ICD-10-CM | POA: Insufficient documentation

## 2024-04-20 DIAGNOSIS — R293 Abnormal posture: Secondary | ICD-10-CM | POA: Insufficient documentation

## 2024-04-20 DIAGNOSIS — R29898 Other symptoms and signs involving the musculoskeletal system: Secondary | ICD-10-CM | POA: Insufficient documentation

## 2024-04-20 NOTE — Therapy (Signed)
 OUTPATIENT PHYSICAL THERAPY SHOULDER TREATMENT   Patient Name: Ana King MRN: [id number] DOB:14-Nov-1950, 73 y.o., female Today's Date: 04/20/2024    END OF SESSION:  PT End of Session - 04/20/24 1054     Visit Number 18    Number of Visits 22    Date for Recertification  04/21/24    Authorization Type human[employer] prior auth required copay $25    Authorization Time Period 20 VISITS APPROVED FOR PT 01/12/2024-04/21/2024    Authorization - Visit Number 18    Authorization - Number of Visits 20    Progress Note Due on Visit 20    PT Start Time 1055    PT Stop Time 1145    PT Time Calculation (min) 50 min    Activity Tolerance Patient tolerated treatment well          Past Medical History:  Diagnosis Date   Asthma    Essential hypertension, benign 08/28/2015   GERD (gastroesophageal reflux disease)    Past Surgical History:  Procedure Laterality Date   ABDOMINAL HYSTERECTOMY     Patient Active Problem List   Diagnosis Date Noted   Fall 01/01/2024   Hypotension 09/23/2023   Lung consolidation 09/23/2023   Abnormal CT lung screening 09/23/2023   Hot flashes 09/23/2023   Opacity of lung on imaging study 06/29/2023   Pulmonary nodule 06/29/2023   Rotator cuff tear, right 04/22/2023   Excessive daytime sleepiness 03/17/2023   Fatigue 03/17/2023   GAD (generalized anxiety disorder) 03/10/2023   Anxiety attack 03/10/2023   Atypical chest pain 02/10/2023   Chronic obstructive pulmonary disease (HCC) 02/10/2023   Mild intermittent asthma with exacerbation 02/04/2023   Pain of great toe, left 12/12/2022   Gout 09/05/2022   Great toe pain, right 09/03/2022   Pruritus 08/05/2022   Chronic allergic rhinitis 07/08/2022   Migraine without aura and without status migrainosus, not intractable 03/28/2022   Seasonal allergic rhinitis due to pollen 12/24/2021   Seasonal allergies 12/24/2021   Decreased GFR 12/24/2021   Injury of right leg 10/14/2021   Bilateral leg edema  07/30/2021   Left ear impacted cerumen 07/30/2021   Vertigo 07/30/2021   Coronary artery calcification seen on CT scan 04/17/2021   Aortic atherosclerosis 04/17/2021   Centrilobular emphysema (HCC) 04/17/2021   Grade I diastolic dysfunction 02/26/2021   CKD (chronic kidney disease) stage 3, GFR 30-59 ml/min (HCC) 02/20/2021   AKI (acute kidney injury) 02/21/2020   Hyponatremia 02/20/2020   Hypocalcemia 02/20/2020   Low hemoglobin 02/20/2020   Herpes zoster with ophthalmic complication 02/03/2020   Acute intractable headache 01/31/2020   Vision changes 01/31/2020   Non-intractable vomiting 01/31/2020   Eye pain, right 01/31/2020   Plantar fasciitis, left 01/24/2020   Diarrhea 11/08/2019   OSA (obstructive sleep apnea) 09/20/2019   Hyperlipidemia 08/18/2019   Non-restorative sleep 08/17/2019   Snoring 08/17/2019   Class 2 obesity due to excess calories without serious comorbidity with body mass index (BMI) of 36.0 to 36.9 in adult 08/17/2019   Cervical spondylosis 05/10/2019   SOB (shortness of breath) on exertion 01/11/2019   Depressed mood 01/11/2019   Osteopenia 03/17/2018   Chronic venous stasis 12/22/2017   Edema of left ankle 12/22/2017   Reactive airway disease 06/09/2017   Former smoker 06/09/2017   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   Contusion 11/13/2015   Essential hypertension, benign 08/28/2015   Hiatal hernia 12/09/2013   Anxiety state 11/02/2013   GERD (gastroesophageal reflux disease) 11/02/2013    PCP: Vermell CROME  [name], PA-C  REFERRING PROVIDER: Dr Elspeth CHRISTELLA Farrow   REFERRING DIAG: R shoulder scope with RCR, SAD, DCR  THERAPY DIAG:  Acute pain of right shoulder  Abnormal posture  Other symptoms and signs involving the musculoskeletal system  Muscle weakness (generalized)  Rationale for Evaluation and Treatment: Rehabilitation  ONSET DATE: 01/06/24  SUBJECTIVE:                                                                                                                                                                                       SUBJECTIVE STATEMENT: 04/20/2024: Ana King reports that her shoulder and arm are feeling better! She is using her arm for more functional activities at home. She is still avoiding didn't lift anything heavy. Minimal to no pain and she is sleeping well   Post MD visit; Patient reports that the doctor is concerned with tightness in the R shoulder.   EVAL: Patient has a history of R rotator cuff tear with no known injury. Symptoms have been present since ~ 02/02/23. Patient underwent R arthroscopic RCR, DCR, SAD 01/06/24. She presents in abduction sling R UE. She has done okay since surgery. She is sleeping in her recliner and is very sedentary at this time. She has an ice machine that does some compression for home and that has helped with the pain. She sleeps with the ice machine and that helps a lot.  Hand dominance: Right  PERTINENT HISTORY: HTN; asthma; COPD; allergies; anxiety; headaches   PAIN:  Are you having pain? Yes: NPRS scale: 0/10 at rest; 4-5/10 increases with exercises Pain location: mid arm(deltoid area); minimal to no pain in the shoulder joint  Pain description: muscle soreness  Aggravating factors: moving in her sleep; trying the pendulum  Relieving factors: ice machine; pain meds  PRECAUTIONS: Shoulder post op per protocol     WEIGHT BEARING RESTRICTIONS: Yes   FALLS:  Has patient fallen in last 6 months? Yes. 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  DIAGNOSTIC FINDINGS:  X-ray R shoulder: 04/06/24: FINDINGS: The bones are subjectively under mineralized. There is no evidence of fracture or dislocation. The alignment is normal. The joint spaces are normal. There  is no evidence of arthropathy or other focal bone abnormality. Soft tissues are unremarkable.   IMPRESSION: Subjective osteopenia/osteoporosis. Otherwise negative radiographs of the right shoulder  PATIENT SURVEYS:  Quick DASH: 84.1/100; 84.1%  03/10/24 quickdash: 52.3%      SENSATION: WFL  POSTURE: Patient presents with head forward posture with increased thoracic kyphosis; shoulders rounded and elevated   UPPER EXTREMITY ROM: R shoulder deferred due to surgery at eval     02/16/24: painful and tight end rang motion - patient with difficulty relaxing for PT ROM     03/31/24; AROM assessed w/ patient standing - note poor scapular control with elevation; PROM assessed w/ patient supine   Active/Passive ROM Right eval Left Eval AROM Right  PROM 02/16/24 R PROM 03/10/24 ROM 03/31/24 PROM 04/20/24  Shoulder flexion  132 91 102 deg AA (PROM limited by muscle guarding)  A: pt self demonstrates <50 deg, not formally measured Active 105 W/ scapular dyskinesis   Passive  135  140  Shoulder extension  47      Shoulder abduction (scaption)  124 60 60 deg PROM ; limited by muscle guarding Active 72 W/ scapular dyskinesis Passive 103 154  Shoulder adduction        Shoulder internal rotation  T10      Shoulder external rotation  49 30 35 deg P/AAROM Active  49 deg Passive 45 54  Elbow flexion 100       Elbow extension -40       Wrist flexion WFL's       Wrist extension WFL's       Wrist ulnar deviation        Wrist radial deviation        Forearm pronation WFL's        Forearm supination WFL's       (Blank rows = not tested)  UPPER EXTREMITY MMT: deferred due to surgery       03/31/24: strength not assessed resistively   MMT  Right eval Left eval  Shoulder flexion    Shoulder extension    Shoulder abduction    Shoulder adduction    Shoulder internal rotation    Shoulder external rotation    Middle trapezius    Lower trapezius    Elbow flexion    Elbow extension    Wrist flexion    Wrist extension    Wrist ulnar deviation    Wrist radial deviation    Wrist pronation    Wrist supination    Grip strength (lbs)    (Blank rows = not tested) Comments: 03/10/24 deferred given postop protocol  PALPATION:  Tightness R shoulder girdle   [facility] Adult PT Treatment:                                                DATE: 04/20/2024 Therapeutic Exercise: Standing pendulum 30 CW/30 CCW Active shoulder flexion x 5; repeated with PT providing stabilization for shoulder girdle   Active shoulder abduction with elbows flexed to 90 deg x 5  Rolling large green therapy ball forward on table to work on R shoulder flexion 10 sec hold x 10  Rolling large green therapy ball forward on table to work on R shoulder scaption 10 sec hold x 10  Counter step back shoulder flexion 10 sec x 5   Manual Therapy: STM R  shoulder girdle patient supine  PROM R shoulder into flexion 120; scaption 90; ER at side in scapular plane 40 Gentle biceps stretch supine 20 sec x 3  Neuromuscular re-education:  Pressing into therapeutic ball to work on scapular depression 3 sec x 10  Sitting with coregeous ball thoracic spine working on activation of posterior shoulder girdle  Sitting with scap squeeze with coregeous ball working on active shoulder flexion  Standing AROM shoulder ER noodle along wall with focus on activation of posterior shoulder girdle musculature Forward prop for row with 4# DB through partial range working on posterior shoulder girdle activation and control 10 x 2  Supine scap squeeze working on activation of posterior shoulder girdle  Sitting hands together lifting UE's into shoulder flexion Therapeutic Activity: Standing:   AAROM arm at side with towel under arm ER with cane x 10  Row blue TB 3 sec x 10 x 2 ER blue step out R 5 sec x 10 x 2 IR blue TB stepping to L 5 sec x 10  Scap squeeze with noodle 10x10 shoulder flexion x 10; scaption x 10 focus on scapular control Shoulder extension with cane behind back 3 sec x 5  Shoulder adduction cane behind back 3 sec x 5  IR cane behind back 3 sec x 5 Swinging R UEfwd/back Modalities: Vaso medium pressure; 34 deg; 10 min   [facility] Adult PT Treatment:                                                DATE: 04/14/2024 Therapeutic Exercise: Standing pendulum 30 CW/30 CCW Active shoulder flexion x 5; repeated with PT providing stabilization for shoulder girdle   Active shoulder abduction with elbows flexed to 90 deg x 5  Rolling large green therapy ball forward on table to work on R shoulder flexion 10 sec hold x 5 x 2  Counter step back shoulder flexion 10 sec x 5   Manual Therapy: STM R shoulder girdle patient supine  PROM R shoulder into flexion 120; scaption 90; ER at side in scapular plane 40 Gentle biceps stretch supine 20 sec x 3  Neuromuscular re-education:  Pressing into therapeutic ball to work on scapular depression 3 sec x 10  Sitting with coregeous ball thoracic spine working on activation of posterior shoulder girdle  Sitting with scap squeeze with coregeous ball working on active shoulder flexion  Standing AROM shoulder ER noodle along wall with focus on activation of posterior shoulder girdle musculature Forward prop for row with 4# DB through partial range working on posterior shoulder girdle activation and control 10 x 2  Supine scap squeeze working on activation of posterior shoulder girdle  Therapeutic Activity: Standing:  AAROM arm at side with towel under arm ER with cane x 10  Row blue TB 3 sec x 10 x 2 ER green step out R 5 sec x 10 x 2 IR green TB stepping to L 5 sec x 10  Scap squeeze with noodle 10x10 shoulder flexion x 10; scaption x  10 focus on scapular control Shoulder extension with cane behind back 3 sec x 5  IR cane behind back 3 sec x 5 Modalities: Vaso medium pressure; 34 deg; 10 min   PATIENT EDUCATION: Education details: POC; HEP Person educated: Patient Education method: Programmer, multimedia, Facilities manager, Actor cues, Verbal cues, and Handouts Education comprehension:  verbalized understanding, returned demonstration, verbal cues required, tactile cues required, and needs further education  HOME EXERCISE PROGRAM: Access Code: [email or web address] URL: [email or web address] Date: 04/14/2024 Prepared by: Aliayah Tyer  Exercises - Seated Scapular Retraction  - 2 x daily - 7 x weekly - 1-2 sets - 10 reps - 10 sec  hold - Seated Shoulder Shrugs  - 2 x daily - 7 x weekly - 1-2 sets - 10 reps - 2-3 sec  hold - Seated Shoulder Flexion AAROM with Pulley Behind  - 2 x daily - 7 x weekly - 1 sets - 10 reps - 10 sec  hold - Seated Shoulder Scaption AAROM with Pulley at Side  - 2 x daily - 7 x weekly - 1 sets - 10 reps - 10sec  hold - Standing Shoulder and Trunk Flexion at Table  - 2 x daily - 7 x weekly - 1 sets - 5-10 reps - 10 sec  hold - Seated Shoulder External Rotation AAROM with Cane and Hand in Neutral  - 2 x daily - 7 x weekly - 1 sets - 5-10 reps - 5-10 sec  hold - Seated Shoulder Flexion  - 2 x daily - 7 x weekly - 1 sets - 5-10 reps - 2-3 sec  hold - Seated Shoulder Flexion Towel Slide at Table Top Full Range of Motion  - 2 x daily - 7 x weekly - 1 sets - 5-10 reps - 10sec  hold - Supine Shoulder Flexion AAROM with Hands Clasped  - 2 x daily - 7 x weekly - 1 sets - 5-10 reps - 2-3 sec  hold - Isometric Shoulder Abduction at Wall  - 2 x daily - 7 x weekly - 1 sets - 5-10 reps - 5 sec  hold - Isometric Shoulder External Rotation at Wall  - 1 x daily - 7 x weekly - 1 sets - 10 reps - 5 sec  hold - Standing Isometric Shoulder Internal Rotation with Towel Roll at Doorway  - 2 x daily - 7 x weekly - 1 sets - 5 reps - 5  sec  hold - Isometric Shoulder Extension at Wall  - 2 x daily - 7 x weekly - 1 sets - 5-10 reps - 5 sec  hold - External Rotation Reactive Isometrics with Flex Bar  - 2 x daily - 7 x weekly - 1-2 sets - 10 reps - 3-5 sec  hold - Shoulder Internal Rotation Reactive Isometrics  - 2 x daily - 7 x weekly - 1 sets - 10 reps - 3-5 sec  hold - Supine Shoulder Rhythmic Stabilization- Horizontal Abduction/Adduction  - 2 x daily - 7 x weekly - 1 sets - 5-10 reps - Supine Shoulder Flexion AAROM with Hands Clasped  - 2 x daily - 7 x weekly - 1 sets - 5-10 reps - 2-3 sec  hold - Anterior Shoulder and Biceps Stretch  - 1 x daily - 7 x weekly - 1 sets - 3 reps - 20 sec  hold - Standing Bilateral Low Shoulder Row with Anchored Resistance  - 1 x daily - 7 x weekly - 1-3 sets - 10 reps - 2-3 sec  hold - Shoulder External Rotation with Anchored Resistance  - 1 x daily - 7 x weekly - 1-2 sets - 10 reps - 3 sec  hold - Shoulder Internal Rotation with Resistance  - 1 x daily - 7 x weekly - 1-2 sets - 10 reps - 3 sec  hold - Bicep Stretch at Table  - 1 x daily - 7 x weekly - 1 sets - 3 reps - 30 sec  hold - Shoulder External Rotation and Scapular Retraction with Resistance  - 2 x daily - 7 x weekly - 1 sets - 10 reps - 3-5 sec  hold - Standing Bilateral Low Shoulder Row with Anchored Resistance  - 2 x daily - 7 x weekly - 1-3 sets - 10 reps - 2-3 sec  hold - Shoulder External Rotation with Anchored Resistance  - 2 x daily - 7 x weekly - 1-2 sets - 10 reps - 3 sec  hold - Standing Shoulder Extension with Dowel  - 1 x daily - 7 x weekly - 1 sets - 5-10 reps - 5-10 sec  hold - Standing Bilateral Shoulder Internal Rotation AAROM with Dowel  - 1 x daily - 7 x weekly - 1 sets - 5-10 reps - 10 sec  hold - Standing Wall [employer] with Mini Swiss Ball  - 1 x daily - 7 x weekly -   hold  ASSESSMENT:  CLINICAL IMPRESSION: 04/20/2024: Janara reports continued improvement in R shoulder with decreased pain and increased  functional activity level with R UE. Treatment consisted of ROM, stabilization, strengthening exercises.  Note improvement in scapular and shoulder posture. She demonstrates poor scapular control with all postures and movements. Scapular control has improvement but scapular retraction remains weak with noted scapular dyskinesis with elevation. Continued gentle stretch for biceps and pecs and TB strengthening exercises. Progressed TB resistance and added ROM exercises and ball/towel on wall stabilization. Progressing well with stability, strength and function. Forward posture inhibits scapular stability and  to lead to faulty shoulder mechanics. Will decrease frequency for PT to 1x/wk in preparation for independent HEP at d/c.   Fabiana exhibits significant forward posture with head of the humerus translating forward and shoulder girdle elevating as she reaches forward. When PT provides stabilization for the scapula and assist to maintain position for movement through partial range in elevation movement is improved. She has no pain with partial range elevation when supported in more anatomically correct position. Continued with PROM; AAROM; light resistive exercises. Patient remains limited by pain and muscle guarding but demonstrates progress. She has difficulty relaxing for manual work and PROM exhibiting increased muscle guarding with PROM. Patient states that she is using R UE to perform ADLs and household tasks which are forward in nature but she is not lifting with R UE. Continued educated patient re-forward nature of most ADL's and sitting/sleeping postures and encouraged consistent postural awareness engaging posterior shoulder girdle to avoid anterior position of GH joint and scapulae.     Eval: Patient is a 73 y.o. female who was seen today for physical therapy evaluation and treatment  s/p R RCR, SAD, DCR 01/06/24 following nontraumatic complete tear of R RC. She presents decreased ROM and strength R  UE, with R shoulder pain and decreased function. She has poor posture and alignment; limited cervical and shoulder ROM, mobility, strength, function. She will benefit from PT to address problems identified.   OBJECTIVE IMPAIRMENTS: decreased activity tolerance, decreased mobility, decreased ROM, decreased strength, increased fascial restrictions, increased muscle spasms, improper body mechanics, postural dysfunction, obesity, and pain.   GOALS: Goals reviewed with patient? Yes  SHORT TERM GOALS: Target date: 02/16/2024  Independent in initial HEP Baseline: Goal status: met  2.  Progress with PROM R shoulder per protocol  Baseline:  03/10/24: see ROM chart  above Goal status: on going   3.  Initiate AROM exercises per protocol  Baseline:  03/10/24: not formally assessed given pain levels w/ PROM/AAROM, pt self-demonstrates <40 deg Goal status: on going    LONG TERM GOALS: Target date: 04/21/2024 (updated 03/10/24)  Increase AROM R shoulder to WFL's as indicated per protocol  Baseline:  03/10/24: not assessed given pain levels w/ PROM/AAROM Goal status: on going   2.  4/5 to 5/5 strength R shoulder  Baseline:  03/10/24: not assessed given post op timeline Goal status: on going   3.  Patient reports use of R UE for functional activities  Baseline:  03/10/24: limited by pain and post op protocol Goal status: on going   4.  Patient reports ability to sleep without awakening due to R shoulder pain  Baseline:  03/10/24: able to sleep without waking due to pain Goal status: MET   5.  Independent in advanced HEP  Baseline:  03/10/24: reports good adherence w/ current HEP but does have some pain Goal status: on going   6.  Improve Quick DASH by 25-30%  Baseline: 84.1/100; 84.1% 03/10/24: 52.3% Goal status: on going   PLAN: (updated 03/10/24)  PT FREQUENCY: 1-2x/week  PT DURATION: 6 weeks  PLANNED INTERVENTIONS: 97164- PT Re-evaluation, 97110-Therapeutic exercises, 97530-  Therapeutic activity, 97112- Neuromuscular re-education, 97535- Self Care, [id number]- Manual therapy, 97016- Vasopneumatic device, and Patient/Family education  PLAN FOR NEXT SESSION: shoulder rehab per MD protocol - add active shoulder flexion in standing; rows; isometric IR/ER; work on ROM in supine   Terralyn Matsumura P. Ina PT, MPH 04/20/24 10:54 AM

## 2024-04-27 ENCOUNTER — Encounter: Payer: Self-pay | Admitting: Rehabilitative and Restorative Service Providers"

## 2024-04-27 ENCOUNTER — Ambulatory Visit: Admitting: Rehabilitative and Restorative Service Providers"

## 2024-04-27 DIAGNOSIS — R293 Abnormal posture: Secondary | ICD-10-CM | POA: Diagnosis not present

## 2024-04-27 DIAGNOSIS — R29898 Other symptoms and signs involving the musculoskeletal system: Secondary | ICD-10-CM | POA: Diagnosis not present

## 2024-04-27 DIAGNOSIS — M6281 Muscle weakness (generalized): Secondary | ICD-10-CM | POA: Diagnosis not present

## 2024-04-27 DIAGNOSIS — M25511 Pain in right shoulder: Secondary | ICD-10-CM | POA: Diagnosis not present

## 2024-04-27 NOTE — Therapy (Signed)
 OUTPATIENT PHYSICAL THERAPY SHOULDER TREATMENT   Patient Name: Ana King MRN: [id number] DOB:Jan 10, 1951, 73 y.o., female Today's Date: 04/27/2024    END OF SESSION:  PT End of Session - 04/27/24 1057     Visit Number 19    Number of Visits 22    Date for Recertification  05/25/24    Authorization Type human[employer] prior auth required copay $25    Authorization Time Period 20 VISITS APPROVED FOR PT 01/12/2024-05/18/2024    Authorization - Visit Number 19    Authorization - Number of Visits 20    Progress Note Due on Visit 20    PT Start Time 1055    PT Stop Time 1140    PT Time Calculation (min) 45 min          Past Medical History:  Diagnosis Date   Asthma    Essential hypertension, benign 08/28/2015   GERD (gastroesophageal reflux disease)    Past Surgical History:  Procedure Laterality Date   ABDOMINAL HYSTERECTOMY     Patient Active Problem List   Diagnosis Date Noted   Fall 01/01/2024   Hypotension 09/23/2023   Lung consolidation 09/23/2023   Abnormal CT lung screening 09/23/2023   Hot flashes 09/23/2023   Opacity of lung on imaging study 06/29/2023   Pulmonary nodule 06/29/2023   Rotator cuff tear, right 04/22/2023   Excessive daytime sleepiness 03/17/2023   Fatigue 03/17/2023   GAD (generalized anxiety disorder) 03/10/2023   Anxiety attack 03/10/2023   Atypical chest pain 02/10/2023   Chronic obstructive pulmonary disease (HCC) 02/10/2023   Mild intermittent asthma with exacerbation 02/04/2023   Pain of great toe, left 12/12/2022   Gout 09/05/2022   Great toe pain, right 09/03/2022   Pruritus 08/05/2022   Chronic allergic rhinitis 07/08/2022   Migraine without aura and without status migrainosus, not intractable 03/28/2022   Seasonal allergic rhinitis due to pollen 12/24/2021   Seasonal allergies 12/24/2021   Decreased GFR 12/24/2021   Injury of right leg 10/14/2021   Bilateral leg edema 07/30/2021   Left ear impacted cerumen 07/30/2021   Vertigo  07/30/2021   Coronary artery calcification seen on CT scan 04/17/2021   Aortic atherosclerosis 04/17/2021   Centrilobular emphysema (HCC) 04/17/2021   Grade I diastolic dysfunction 02/26/2021   CKD (chronic kidney disease) stage 3, GFR 30-59 ml/min (HCC) 02/20/2021   AKI (acute kidney injury) 02/21/2020   Hyponatremia 02/20/2020   Hypocalcemia 02/20/2020   Low hemoglobin 02/20/2020   Herpes zoster with ophthalmic complication 02/03/2020   Acute intractable headache 01/31/2020   Vision changes 01/31/2020   Non-intractable vomiting 01/31/2020   Eye pain, right 01/31/2020   Plantar fasciitis, left 01/24/2020   Diarrhea 11/08/2019   OSA (obstructive sleep apnea) 09/20/2019   Hyperlipidemia 08/18/2019   Non-restorative sleep 08/17/2019   Snoring 08/17/2019   Class 2 obesity due to excess calories without serious comorbidity with body mass index (BMI) of 36.0 to 36.9 in adult 08/17/2019   Cervical spondylosis 05/10/2019   SOB (shortness of breath) on exertion 01/11/2019   Depressed mood 01/11/2019   Osteopenia 03/17/2018   Chronic venous stasis 12/22/2017   Edema of left ankle 12/22/2017   Reactive airway disease 06/09/2017   Former smoker 06/09/2017   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   Contusion 11/13/2015   Essential hypertension, benign 08/28/2015   Hiatal hernia 12/09/2013   Anxiety state 11/02/2013   GERD (gastroesophageal reflux disease) 11/02/2013    PCP: Vermell LITTIE Bologna, PA-C  REFERRING PROVIDER: Dr Elspeth CHRISTELLA Farrow  REFERRING DIAG: R shoulder scope with RCR, SAD, DCR  THERAPY DIAG:  Acute pain of right shoulder  Abnormal posture  Other symptoms and signs involving the musculoskeletal system  Muscle weakness (generalized)  Rationale for Evaluation and Treatment: Rehabilitation  ONSET DATE: 01/06/24  SUBJECTIVE:                                                                                                                                                                                       SUBJECTIVE STATEMENT: 04/27/2024: Ana King reports that her shoulder and arm are feeling good! She is using her arm for functional activities at home. She is still avoiding doesn't lift anything heavy. Minimal to no pain and she is sleeping well   Post MD visit; Patient reports that the doctor is concerned with tightness in the R shoulder.   EVAL: Patient has a history of R rotator cuff tear with no known injury. Symptoms have been present since ~ 02/02/23. Patient underwent R arthroscopic RCR, DCR, SAD 01/06/24. She presents in abduction sling R UE. She has done okay since surgery. She is sleeping in her recliner and is very sedentary at this time. She has an ice machine that does some compression for home and that has helped with the pain. She sleeps with the ice machine and that helps a lot.  Hand dominance: Right  PERTINENT HISTORY: HTN; asthma; COPD; allergies; anxiety; headaches   PAIN:  Are you having pain? Yes: NPRS scale: 0/10 at rest;  2/10 with exercises Pain location: mid arm(deltoid area); minimal to no pain in the shoulder joint  Pain description: muscle soreness  Aggravating factors: moving in her sleep; trying the pendulum  Relieving factors: ice machine; pain meds  PRECAUTIONS: Shoulder post op per protocol     WEIGHT BEARING RESTRICTIONS: Yes   FALLS:  Has patient fallen in last 6 months? Yes. 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  DIAGNOSTIC FINDINGS:  X-ray R  shoulder: 04/06/24: FINDINGS: The bones are subjectively under mineralized. There is no evidence of fracture or dislocation. The alignment is normal. The joint spaces are normal. There is no evidence of arthropathy or other focal bone abnormality. Soft tissues  are unremarkable.   IMPRESSION: Subjective osteopenia/osteoporosis. Otherwise negative radiographs of the right shoulder  PATIENT SURVEYS:  Quick DASH: 84.1/100; 84.1%  03/10/24 quickdash: 52.3%      SENSATION: WFL  POSTURE: Patient presents with head forward posture with increased thoracic kyphosis; shoulders rounded and elevated   UPPER EXTREMITY ROM: R shoulder deferred due to surgery at eval     02/16/24: painful and tight end rang motion - patient with difficulty relaxing for PT ROM     03/31/24; AROM assessed w/ patient standing - note poor scapular control with elevation; PROM assessed w/ patient supine   Active/Passive ROM Right eval Left Eval AROM Right  PROM 02/16/24 R PROM 03/10/24 ROM 03/31/24 PROM 04/20/24  Shoulder flexion  132 91 102 deg AA (PROM limited by muscle guarding)  A: pt self demonstrates <50 deg, not formally measured Active 105 W/ scapular dyskinesis   Passive  135  140  Shoulder extension  47      Shoulder abduction (scaption)  124 60 60 deg PROM ; limited by muscle guarding Active 72 W/ scapular dyskinesis Passive 103 154  Shoulder adduction        Shoulder internal rotation  T10      Shoulder external rotation  49 30 35 deg P/AAROM Active  49 deg Passive 45 54  Elbow flexion 100       Elbow extension -40       Wrist flexion WFL's       Wrist extension WFL's       Wrist ulnar deviation        Wrist radial deviation        Forearm pronation WFL's        Forearm supination WFL's       (Blank rows = not tested)  UPPER EXTREMITY MMT: deferred due to surgery       03/31/24: strength not assessed resistively   MMT Right eval Left eval  Shoulder flexion    Shoulder extension     Shoulder abduction    Shoulder adduction    Shoulder internal rotation    Shoulder external rotation    Middle trapezius    Lower trapezius    Elbow flexion    Elbow extension    Wrist flexion    Wrist extension    Wrist ulnar deviation    Wrist radial deviation    Wrist pronation    Wrist supination    Grip strength (lbs)    (Blank rows = not tested) Comments: 03/10/24 deferred given postop protocol  PALPATION:  Tightness R shoulder girdle   [facility] Adult PT Treatment:                                                DATE: 04/27/2024 Therapeutic Exercise: Active shoulder flexion x 5; repeated with PT providing stabilization for shoulder girdle   Active shoulder abduction with elbows flexed to 90 deg x 5  Rolling large green therapy ball forward on table to work on R shoulder flexion 10 sec hold x 10  Rolling large green therapy ball forward on table to work on R shoulder scaption 10 sec hold x 10  Counter step back shoulder flexion 10 sec x 5   Manual Therapy: STM R shoulder girdle patient supine  PROM R shoulder into flexion 120; scaption 90; ER at side in  scapular plane 40 Gentle biceps stretch supine 20 sec x 3  Neuromuscular re-education:  Pressing into therapeutic ball to work on scapular depression 3 sec x 10  Sitting with coregeous ball thoracic spine working on activation of posterior shoulder girdle  Sitting with scap squeeze with coregeous ball working on active shoulder flexion  Standing AROM shoulder ER noodle along wall with focus on activation of posterior shoulder girdle musculature Forward prop for row with 5# DB through partial range working on posterior shoulder girdle activation and control 10 x 2  Supine scap squeeze working on activation of posterior shoulder girdle  Sitting hands together lifting UE's into shoulder flexion Therapeutic Activity: Standing:  AAROM arm at side with towel under arm ER with cane x 10  Row blue TB 3 sec x 10 x 2 ER blue step  out R 5 sec x 10 x 2 IR blue TB stepping to L 5 sec x 10  Scap squeeze with noodle 10x10 shoulder flexion x 10; scaption x 10 focus on scapular control Shoulder extension with cane behind back 3 sec x 5  Shoulder adduction cane behind back 3 sec x 5  IR cane behind back 3 sec x 5 Swinging R UEfwd/back Perturbation of green ex ball on wall by PT patient stabilizing with R UE    [facility] Adult PT Treatment:                                                DATE: 04/20/2024 Therapeutic Exercise: Standing pendulum 30 CW/30 CCW Active shoulder flexion x 5; repeated with PT providing stabilization for shoulder girdle   Active shoulder abduction with elbows flexed to 90 deg x 5  Rolling large green therapy ball forward on table to work on R shoulder flexion 10 sec hold x 10  Rolling large green therapy ball forward on table to work on R shoulder scaption 10 sec hold x 10  Counter step back shoulder flexion 10 sec x 5   Manual Therapy: STM R shoulder girdle patient supine  PROM R shoulder into flexion 120; scaption 90; ER at side in scapular plane 40 Gentle biceps stretch supine 20 sec x 3  Neuromuscular re-education:  Pressing into therapeutic ball to work on scapular depression 3 sec x 10  Sitting with coregeous ball thoracic spine working on activation of posterior shoulder girdle  Sitting with scap squeeze with coregeous ball working on active shoulder flexion  Standing AROM shoulder ER noodle along wall with focus on activation of posterior shoulder girdle musculature Forward prop for row with 4# DB through partial range working on posterior shoulder girdle activation and control 10 x 2  Supine scap squeeze working on activation of posterior shoulder girdle  Sitting hands together lifting UE's into shoulder flexion Therapeutic Activity: Standing:  AAROM arm at side with towel under arm ER with cane x 10  Row blue TB 3 sec x 10 x 2 ER blue step out R 5 sec x 10 x 2 IR blue TB stepping to L 5  sec x 10  Scap squeeze with noodle 10x10 shoulder flexion x 10; scaption x 10 focus on scapular control Shoulder extension with cane behind back 3 sec x 5  Shoulder adduction cane behind back 3 sec x 5  IR cane behind back 3 sec x 5 Swinging R UEfwd/back Modalities: Vaso  medium pressure; 34 deg; 10 min   PATIENT EDUCATION: Education details: POC; HEP Person educated: Patient Education method: Explanation, Demonstration, Tactile cues, Verbal cues, and Handouts Education comprehension: verbalized understanding, returned demonstration, verbal cues required, tactile cues required, and needs further education  HOME EXERCISE PROGRAM: Access Code: [email or web address] URL: [email or web address] Date: 04/14/2024 Prepared by: Shaquanda Graves  Exercises - Seated Scapular Retraction  - 2 x daily - 7 x weekly - 1-2 sets - 10 reps - 10 sec  hold - Seated Shoulder Shrugs  - 2 x daily - 7 x weekly - 1-2 sets - 10 reps - 2-3 sec  hold - Seated Shoulder Flexion AAROM with Pulley Behind  - 2 x daily - 7 x weekly - 1 sets - 10 reps - 10 sec  hold - Seated Shoulder Scaption AAROM with Pulley at Side  - 2 x daily - 7 x weekly - 1 sets - 10 reps - 10sec  hold - Standing Shoulder and Trunk Flexion at Table  - 2 x daily - 7 x weekly - 1 sets - 5-10 reps - 10 sec  hold - Seated Shoulder External Rotation AAROM with Cane and Hand in Neutral  - 2 x daily - 7 x weekly - 1 sets - 5-10 reps - 5-10 sec  hold - Seated Shoulder Flexion  - 2 x daily - 7 x weekly - 1 sets - 5-10 reps - 2-3 sec  hold - Seated Shoulder Flexion Towel Slide at Table Top Full Range of Motion  - 2 x daily - 7 x weekly - 1 sets - 5-10 reps - 10sec  hold - Supine Shoulder Flexion AAROM with Hands Clasped  - 2 x daily - 7 x weekly - 1 sets - 5-10 reps - 2-3 sec  hold - Isometric Shoulder Abduction at Wall  - 2 x daily - 7 x weekly - 1 sets - 5-10 reps - 5 sec  hold - Isometric Shoulder External Rotation at Wall  - 1 x daily - 7 x weekly - 1 sets  - 10 reps - 5 sec  hold - Standing Isometric Shoulder Internal Rotation with Towel Roll at Doorway  - 2 x daily - 7 x weekly - 1 sets - 5 reps - 5 sec  hold - Isometric Shoulder Extension at Wall  - 2 x daily - 7 x weekly - 1 sets - 5-10 reps - 5 sec  hold - External Rotation Reactive Isometrics with Flex Bar  - 2 x daily - 7 x weekly - 1-2 sets - 10 reps - 3-5 sec  hold - Shoulder Internal Rotation Reactive Isometrics  - 2 x daily - 7 x weekly - 1 sets - 10 reps - 3-5 sec  hold - Supine Shoulder Rhythmic Stabilization- Horizontal Abduction/Adduction  - 2 x daily - 7 x weekly - 1 sets - 5-10 reps - Supine Shoulder Flexion AAROM with Hands Clasped  - 2 x daily - 7 x weekly - 1 sets - 5-10 reps - 2-3 sec  hold - Anterior Shoulder and Biceps Stretch  - 1 x daily - 7 x weekly - 1 sets - 3 reps - 20 sec  hold - Standing Bilateral Low Shoulder Row with Anchored Resistance  - 1 x daily - 7 x weekly - 1-3 sets - 10 reps - 2-3 sec  hold - Shoulder External Rotation with Anchored Resistance  - 1 x daily - 7 x weekly - 1-2 sets - 10 reps -  3 sec  hold - Shoulder Internal Rotation with Resistance  - 1 x daily - 7 x weekly - 1-2 sets - 10 reps - 3 sec  hold - Bicep Stretch at Table  - 1 x daily - 7 x weekly - 1 sets - 3 reps - 30 sec  hold - Shoulder External Rotation and Scapular Retraction with Resistance  - 2 x daily - 7 x weekly - 1 sets - 10 reps - 3-5 sec  hold - Standing Bilateral Low Shoulder Row with Anchored Resistance  - 2 x daily - 7 x weekly - 1-3 sets - 10 reps - 2-3 sec  hold - Shoulder External Rotation with Anchored Resistance  - 2 x daily - 7 x weekly - 1-2 sets - 10 reps - 3 sec  hold - Standing Shoulder Extension with Dowel  - 1 x daily - 7 x weekly - 1 sets - 5-10 reps - 5-10 sec  hold - Standing Bilateral Shoulder Internal Rotation AAROM with Dowel  - 1 x daily - 7 x weekly - 1 sets - 5-10 reps - 10 sec  hold - Standing Wall [employer] with Mini Swiss Ball  - 1 x daily - 7 x weekly -    hold  ASSESSMENT:  CLINICAL IMPRESSION: 04/27/2024: Amadi reports increased functional activities with R shoulder. She has minimal pain and with exercises which resolves in ~ 5 min after exercises. Treatment consisted of ROM, stabilization, strengthening exercises.  Note continued improvement in scapular and shoulder posture. She demonstrates gradual improvement in scapular control postures and movements. Scapular control has improvement but scapular retraction needs further strengthening. Note persistent scapular dyskinesis with elevation. Continued stretch for biceps and pecs and TB strengthening exercises. Added perturbation with ball on wall for stabilization. Progressing well with stability, strength and function. Forward posture inhibits scapular stability and  to lead to faulty shoulder mechanics. Anticipate d/c to independent HEP at after next visit.   Symone exhibits significant forward posture with head of the humerus translating forward and shoulder girdle elevating as she reaches forward. When PT provides stabilization for the scapula and assist to maintain position for movement through partial range in elevation movement is improved. She has no pain with partial range elevation when supported in more anatomically correct position. Continued with PROM; AAROM; light resistive exercises. Patient remains limited by pain and muscle guarding but demonstrates progress. She has difficulty relaxing for manual work and PROM exhibiting increased muscle guarding with PROM. Patient states that she is using R UE to perform ADLs and household tasks which are forward in nature but she is not lifting with R UE. Continued educated patient re-forward nature of most ADL's and sitting/sleeping postures and encouraged consistent postural awareness engaging posterior shoulder girdle to avoid anterior position of GH joint and scapulae.     Eval: Patient is a 73 y.o. female who was seen today for physical therapy  evaluation and treatment  s/p R RCR, SAD, DCR 01/06/24 following nontraumatic complete tear of R RC. She presents decreased ROM and strength R UE, with R shoulder pain and decreased function. She has poor posture and alignment; limited cervical and shoulder ROM, mobility, strength, function. She will benefit from PT to address problems identified.   OBJECTIVE IMPAIRMENTS: decreased activity tolerance, decreased mobility, decreased ROM, decreased strength, increased fascial restrictions, increased muscle spasms, improper body mechanics, postural dysfunction, obesity, and pain.   GOALS: Goals reviewed with patient? Yes  SHORT TERM GOALS: Target date: 02/16/2024  Independent in initial HEP Baseline: Goal status: met  2.  Progress with PROM R shoulder per protocol  Baseline:  03/10/24: see ROM chart above Goal status: met  3.  Initiate AROM exercises per protocol  Baseline:  03/10/24: not formally assessed given pain levels w/ PROM/AAROM, pt self-demonstrates <40 deg Goal status: met   LONG TERM GOALS: Target date: 05/18/2024   Increase AROM R shoulder to WFL's as indicated per protocol  Baseline:  03/10/24: not assessed given pain levels w/ PROM/AAROM Goal status: on going   2.  4/5 to 5/5 strength R shoulder  Baseline:  03/10/24: not assessed given post op timeline Goal status: on going   3.  Patient reports use of R UE for functional activities  Baseline:  03/10/24: limited by pain and post op protocol Goal status: on going   4.  Patient reports ability to sleep without awakening due to R shoulder pain  Baseline:  03/10/24: able to sleep without waking due to pain Goal status: MET   5.  Independent in advanced HEP  Baseline:  03/10/24: reports good adherence w/ current HEP but does have some pain Goal status: on going   6.  Improve Quick DASH by 25-30%  Baseline: 84.1/100; 84.1% 03/10/24: 52.3% Goal status: met   PT FREQUENCY: 1x/week   PT DURATION: 4 weeks  PLANNED  INTERVENTIONS: 97164- PT Re-evaluation, 97110-Therapeutic exercises, 97530- Therapeutic activity, 97112- Neuromuscular re-education, 97535- Self Care, 02859- Manual therapy, 97016- Vasopneumatic device, and Patient/Family education  PLAN FOR NEXT SESSION: shoulder rehab per MD protocol - add active shoulder flexion in standing; rows; isometric IR/ER; work on ROM in supine anticipate d/c to independent HEP at next visit   Romolo Sieling P. Ina PT, MPH 04/27/24 11:51 AM

## 2024-05-04 ENCOUNTER — Telehealth: Payer: Self-pay

## 2024-05-04 NOTE — Telephone Encounter (Signed)
 Ana King declined to do a Cologuard.

## 2024-05-11 ENCOUNTER — Ambulatory Visit: Admitting: Rehabilitative and Restorative Service Providers"

## 2024-05-11 ENCOUNTER — Ambulatory Visit: Payer: Self-pay

## 2024-05-11 NOTE — Telephone Encounter (Signed)
 Appointment scheduled.
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
## 2024-05-12 ENCOUNTER — Ambulatory Visit

## 2024-05-12 ENCOUNTER — Ambulatory Visit: Admitting: Family Medicine

## 2024-05-12 VITALS — BP 145/81 | HR 71 | Ht 63.0 in | Wt 196.0 lb

## 2024-05-12 DIAGNOSIS — M79605 Pain in left leg: Secondary | ICD-10-CM | POA: Diagnosis not present

## 2024-05-12 DIAGNOSIS — S81812A Laceration without foreign body, left lower leg, initial encounter: Secondary | ICD-10-CM | POA: Diagnosis not present

## 2024-05-12 NOTE — Progress Notes (Signed)
 Ana King - 73 y.o. female MRN [id number]  Date of birth: [birth year]  Subjective Chief Complaint  Patient presents with   leg wound   Bleeding/Bruising    HPI Ana King is a 73 y.o. female here today with complaint of wound to the L shin.  This started over this past weekend.  She fell while getting onto a shuttle bus at a wedding.  Has two wounds on the anterior leg and bruising surrounding this.  She does have pain with ambulating longer distances. She does have some mild swelling as well.  No redness, warmth or drainage.  She has not had fever or chills.  ROS:  A comprehensive ROS was completed and negative except as noted per HPI  Allergies  Allergen Reactions   Penicillins Hives    Past Medical History:  Diagnosis Date   Asthma    Essential hypertension, benign 08/28/2015   GERD (gastroesophageal reflux disease)     Past Surgical History:  Procedure Laterality Date   ABDOMINAL HYSTERECTOMY      Social History   Socioeconomic History   Marital status: Married    Spouse name: Zachary   Number of children: 2   Years of education: 10th   Highest education level: 10th grade  Occupational History   Occupation: retired    Comment: housewife  Tobacco Use   Smoking status: Former    Current packs/day: 0.00    Types: Cigarettes    Quit date: 05/21/2014    Years since quitting: 9.9   Smokeless tobacco: Never  Vaping Use   Vaping status: Never Used  Substance and Sexual Activity   Alcohol use: Never    Alcohol/week: 0.0 standard drinks of alcohol   Drug use: Never   Sexual activity: Not Currently  Other Topics Concern   Not on file  Social History Narrative   Lives with her husband. She has two children. She enjoys playing bingo and spending time with her grand kids.   Social Drivers of Corporate investment banker Strain: Low Risk  (05/12/2024)   Overall Financial Resource Strain (CARDIA)    Difficulty of Paying Living Expenses: Not hard at all  Food  Insecurity: No Food Insecurity (05/12/2024)   Hunger Vital Sign    Worried About Running Out of Food in the Last Year: Never true    Ran Out of Food in the Last Year: Never true  Transportation Needs: No Transportation Needs (05/12/2024)   PRAPARE - Administrator, Civil Service (Medical): No    Lack of Transportation (Non-Medical): No  Physical Activity: Inactive (05/12/2024)   Exercise Vital Sign    Days of Exercise per Week: 1 day    Minutes of Exercise per Session: 0 min  Stress: No Stress Concern Present (05/12/2024)   [employer] of Occupational Health - Occupational Stress Questionnaire    Feeling of Stress: Not at all  Social Connections: Moderately Isolated (05/12/2024)   Social Connection and Isolation Panel    Frequency of Communication with Friends and Family: Once a week    Frequency of Social Gatherings with Friends and Family: Once a week    Attends Religious Services: More than 4 times per year    Active Member of [employer] or Organizations: No    Attends Engineer, structural: Not on file    Marital Status: Married    Family History  Problem Relation Age of Onset   Cancer Mother    Diabetes Maternal Aunt  Hypertension Maternal Aunt    Cancer Maternal Grandmother    Diabetes Maternal Grandmother     Health Maintenance  Topic Date Due   Mammogram  03/17/2020   Medicare Annual Wellness (AWV)  11/11/2023   Colonoscopy  06/28/2024 (Originally 01/05/1997)   COVID-19 Vaccine (3 - Pfizer risk series) 05/28/2025 (Originally 05/26/2020)   Lung Cancer Screening  12/28/2024   DTaP/Tdap/Td (2 - Td or Tdap) 03/02/2028   Pneumococcal Vaccine: 50+ Years  Completed   Influenza Vaccine  Completed   DEXA SCAN  Completed   Hepatitis C Screening  Completed   Zoster Vaccines- Shingrix  Completed   Meningococcal B Vaccine  Aged Out      ----------------------------------------------------------------------------------------------------------------------------------------------------------------------------------------------------------------- Physical Exam BP (!) 145/81 (BP Location: Left Arm, Patient Position: Sitting, Cuff Size: Normal)   Pulse 71   Ht 5' 3 (1.6 m)   Wt 196 lb (88.9 kg)   SpO2 97%   BMI 34.72 kg/m   Physical Exam Constitutional:      Appearance: Normal appearance.  Cardiovascular:     Rate and Rhythm: Normal rate and regular rhythm.  Pulmonary:     Effort: Pulmonary effort is normal.     Breath sounds: Normal breath sounds.  Musculoskeletal:     Comments: Left leg with 2 small nonbleeding lacerations.  No erythema or drainage.  She does have surrounding bruising along most of the shin.  She does have tenderness to palpation along the proximal and distal shin.  Neurological:     Mental Status: She is alert.     ------------------------------------------------------------------------------------------------------------------------------------------------------------------------------------------------------------------- Assessment and Plan  Left leg pain X-rays of left tib-fib are negative for fracture by my interpretation today.  No subcutaneous gas noted.  Laceration of skin of left lower leg Nonbleeding laceration x 2 to the left lower extremity.  Wound is dressed with Xeroform.  She will continue dressing changes at home.  Reviewed signs and symptoms of infection.   No orders of the defined types were placed in this encounter.   No follow-ups on file.

## 2024-05-12 NOTE — Assessment & Plan Note (Signed)
 X-rays of left tib-fib are negative for fracture by my interpretation today.  No subcutaneous gas noted.

## 2024-05-12 NOTE — Assessment & Plan Note (Signed)
 Nonbleeding laceration x 2 to the left lower extremity.  Wound is dressed with Xeroform.  She will continue dressing changes at home.  Reviewed signs and symptoms of infection.

## 2024-05-17 ENCOUNTER — Ambulatory Visit: Admitting: Rehabilitative and Restorative Service Providers"

## 2024-05-17 ENCOUNTER — Encounter: Payer: Self-pay | Admitting: Rehabilitative and Restorative Service Providers"

## 2024-05-17 DIAGNOSIS — M25511 Pain in right shoulder: Secondary | ICD-10-CM

## 2024-05-17 DIAGNOSIS — R293 Abnormal posture: Secondary | ICD-10-CM

## 2024-05-17 DIAGNOSIS — R29898 Other symptoms and signs involving the musculoskeletal system: Secondary | ICD-10-CM | POA: Diagnosis not present

## 2024-05-17 DIAGNOSIS — M6281 Muscle weakness (generalized): Secondary | ICD-10-CM | POA: Diagnosis not present

## 2024-05-17 NOTE — Therapy (Signed)
 OUTPATIENT PHYSICAL THERAPY SHOULDER TREATMENT 20th Medicare Progress Note Reporting Period 03/15/24 to 05/17/24  See note below for Objective Data and Assessment of Progress/Goals.   PHYSICAL THERAPY DISCHARGE SUMMARY  Visits from Start of Care: 20  Current functional level related to goals / functional outcomes: See progress note for discharge status    Remaining deficits: No known deficits - patient needs to continue with independent HEP    Education / Equipment: HEP/therabands    Patient agrees to discharge. Patient goals were met. Patient is being discharged due to meeting the stated rehab goals.   Ana King P. Ina PT, MPH 05/17/24 12:01 PM   Patient Name: Ana King MRN: [id number] DOB:[birth year], 73 y.o., female Today's Date: 05/17/2024    END OF SESSION:  PT End of Session - 05/17/24 1059     Visit Number 20    Number of Visits 22    Date for Recertification  05/25/24    Authorization Type human[employer] prior auth required copay $25    Authorization Time Period 20 VISITS APPROVED FOR PT 01/12/2024-05/18/2024    Authorization - Visit Number 20    Authorization - Number of Visits 20    Progress Note Due on Visit 20    PT Start Time 1100    PT Stop Time 1145    PT Time Calculation (min) 45 min    Activity Tolerance Patient tolerated treatment well          Past Medical History:  Diagnosis Date   Asthma    Essential hypertension, benign 08/28/2015   GERD (gastroesophageal reflux disease)    Past Surgical History:  Procedure Laterality Date   ABDOMINAL HYSTERECTOMY     Patient Active Problem List   Diagnosis Date Noted   Left leg pain 05/12/2024   Laceration of skin of left lower leg 05/12/2024   Rupture of right proximal biceps tendon 01/06/2024   Fall 01/01/2024   Hypotension 09/23/2023   Lung consolidation 09/23/2023   Abnormal CT lung screening 09/23/2023   Hot flashes 09/23/2023   Opacity of lung on imaging study 06/29/2023   Pulmonary nodule  06/29/2023   Rotator cuff tear, right 04/22/2023   Excessive daytime sleepiness 03/17/2023   Fatigue 03/17/2023   GAD (generalized anxiety disorder) 03/10/2023   Anxiety attack 03/10/2023   Atypical chest pain 02/10/2023   Chronic obstructive pulmonary disease (HCC) 02/10/2023   Mild intermittent asthma with exacerbation 02/04/2023   Pain of great toe, left 12/12/2022   Gout 09/05/2022   Great toe pain, right 09/03/2022   Pruritus 08/05/2022   Chronic allergic rhinitis 07/08/2022   Migraine without aura and without status migrainosus, not intractable 03/28/2022   Seasonal allergic rhinitis due to pollen 12/24/2021   Seasonal allergies 12/24/2021   Decreased GFR 12/24/2021   Injury of right leg 10/14/2021   Bilateral leg edema 07/30/2021   Left ear impacted cerumen 07/30/2021   Vertigo 07/30/2021   Coronary artery calcification seen on CT scan 04/17/2021   Aortic atherosclerosis 04/17/2021   Centrilobular emphysema (HCC) 04/17/2021   Grade I diastolic dysfunction 02/26/2021   CKD (chronic kidney disease) stage 3, GFR 30-59 ml/min (HCC) 02/20/2021   AKI (acute kidney injury) 02/21/2020   Hyponatremia 02/20/2020   Hypocalcemia 02/20/2020   Low hemoglobin 02/20/2020   Herpes zoster with ophthalmic complication 02/03/2020   Acute intractable headache 01/31/2020   Vision changes 01/31/2020   Non-intractable vomiting 01/31/2020   Eye pain, right 01/31/2020   Plantar fasciitis, left 01/24/2020   Diarrhea 11/08/2019  OSA (obstructive sleep apnea) 09/20/2019   Hyperlipidemia 08/18/2019   Non-restorative sleep 08/17/2019   Snoring 08/17/2019   Class 2 obesity due to excess calories without serious comorbidity with body mass index (BMI) of 36.0 to 36.9 in adult 08/17/2019   Cervical spondylosis 05/10/2019   SOB (shortness of breath) on exertion 01/11/2019   Depressed mood 01/11/2019   Osteopenia 03/17/2018   Chronic venous stasis 12/22/2017   Edema of left ankle 12/22/2017    Reactive airway disease 06/09/2017   Former smoker 06/09/2017   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   Contusion 11/13/2015   Essential hypertension, benign 08/28/2015   Hiatal hernia 12/09/2013   Anxiety state 11/02/2013   GERD (gastroesophageal reflux disease) 11/02/2013    PCP: Ana LITTIE Bologna, PA-C  REFERRING PROVIDER: Dr Elspeth CHRISTELLA Farrow   REFERRING DIAG: R shoulder scope with RCR, SAD, DCR  THERAPY DIAG:  Acute pain of right shoulder  Abnormal posture  Other symptoms and signs involving the musculoskeletal system  Muscle weakness (generalized)  Rationale for Evaluation and Treatment: Rehabilitation  ONSET DATE: 01/06/24  SUBJECTIVE:                                                                                                                                                                                      SUBJECTIVE STATEMENT: 05/17/2024: Ana King reports that her shoulder and arm are doing fine. She is still avoiding doesn't lift anything that is too heavy. Minimal to no pain and she is sleeping well. Confident in continuing independent HEP. She fell ~ 2 weeks ago and hit her shin on a step. She may have irritated her side and the shoulder pulling to get up on the shuttle at her nieces wedding. She was seen by MD this week and nothing is broken.   EVAL: Patient has a history of R rotator cuff tear with no known injury. Symptoms have been present since ~ 02/02/23. Patient underwent R arthroscopic RCR, DCR, SAD 01/06/24. She presents in abduction sling R UE. She has done okay since surgery. She is sleeping in her recliner and is very sedentary at this time. She has an ice machine that does some compression for home and that has helped with the pain. She sleeps with the ice machine and that helps a lot.  Hand dominance: Right  PERTINENT HISTORY: HTN; asthma; COPD; allergies; anxiety; headaches   PAIN:  Are you having pain? Yes: NPRS scale: 0/10 at rest;  2/10 with  exercises Pain location: mid arm(deltoid area); minimal to no pain in the shoulder joint  Pain description: muscle soreness  Aggravating factors: moving in her sleep; trying the pendulum  Relieving factors: ice machine;  pain meds  PRECAUTIONS: Shoulder post op per protocol     WEIGHT BEARING RESTRICTIONS: Yes   FALLS:  Has patient fallen in last 6 months? Yes. 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  DIAGNOSTIC FINDINGS:  X-ray R shoulder: 04/06/24: FINDINGS: The bones are subjectively under mineralized. There is no evidence of fracture or dislocation. The alignment is normal. The joint spaces are normal. There is no evidence of arthropathy or other focal bone abnormality. Soft tissues are unremarkable.   IMPRESSION: Subjective osteopenia/osteoporosis. Otherwise negative radiographs of the right shoulder  PATIENT SURVEYS:  Quick DASH: 84.1/100; 84.1%  03/10/24 quickdash: 52.3%      SENSATION: WFL  POSTURE: Patient presents with head forward posture with increased thoracic kyphosis; shoulders rounded and elevated   UPPER EXTREMITY ROM: R shoulder deferred due to surgery at eval     02/16/24: painful and tight end rang motion - patient with difficulty relaxing for PT ROM     03/31/24; AROM assessed w/ patient standing - note poor scapular control with elevation; PROM assessed w/ patient supine       05/17/24: patient supine for PROM  Active/Passive ROM Right eval Left Eval AROM Right  PROM 02/16/24 R  PROM 03/10/24 ROM 03/31/24 PROM 04/20/24 PROM  05/17/24  Shoulder flexion  132 91 102 deg AA (PROM limited by muscle guarding)  A: pt self demonstrates <50 deg, not formally measured Active 105 W/ scapular dyskinesis   Passive  135  140 140  Shoulder extension  47       Shoulder abduction (scaption)  124 60 60 deg PROM ; limited by muscle guarding Active 72 W/ scapular dyskinesis Passive 103 154 150  Shoulder adduction         Shoulder internal rotation  T10       Shoulder external rotation  49 30 35 deg P/AAROM Active  49 deg Passive 45 54 60  Elbow flexion 100        Elbow extension -40        Wrist flexion WFL's        Wrist extension WFL's        Wrist ulnar deviation         Wrist radial deviation         Forearm pronation WFL's         Forearm supination WFL's        (Blank rows = not tested)  UPPER EXTREMITY MMT: deferred due to surgery       03/31/24: strength not assessed resistively      05/17/24: functional strength noted throughout R UE assessed with patient in supine  MMT Right eval Left eval  Shoulder flexion 5   Shoulder extension 5   Shoulder abduction 5   Shoulder adduction    Shoulder internal rotation 5   Shoulder external rotation 5   Middle trapezius    Lower trapezius    Elbow flexion    Elbow extension    Wrist flexion    Wrist extension  Wrist ulnar deviation    Wrist radial deviation    Wrist pronation    Wrist supination    Grip strength (lbs)    (Blank rows = not tested) Comments: 03/10/24 deferred given postop protocol  PALPATION:  Tightness R shoulder girdle   [facility] Adult PT Treatment:                                                DATE: 05/17/2024 Reassessment for discharge  Review of HEP and importance of consistent exercise to continue to build strength and function in R UE  Therapeutic Exercise: Active shoulder flexion x 5 x 2 Active shoulder abduction with elbows flexed to 90 deg x 5  Rolling large green therapy ball  forward on table to work on R shoulder flexion 10 sec hold x 10  Rolling large green therapy ball forward on table to work on R shoulder scaption 10 sec hold x 10  Counter step back shoulder flexion 10 sec x 5   Manual Therapy: STM R shoulder girdle patient supine  PROM R shoulder into flexion 140; scaption 120; ER at side in scapular plane 60 Gentle biceps stretch supine 20 sec x 3  Neuromuscular re-education:  Sitting with coregeous ball thoracic spine working on activation of posterior shoulder girdle  Sitting with scap squeeze with coregeous ball working on active shoulder flexion  Standing AROM shoulder ER noodle along wall with focus on activation of posterior shoulder girdle musculature Forward prop for row with 5# DB through partial range working on posterior shoulder girdle activation and control  Supine scap squeeze working on activation of posterior shoulder girdle  Therapeutic Activity: Standing: review for HEP AAROM arm at side with towel under arm ER with cane x 10  Row blue TB 3 sec x 10 x 2 ER blue step out R 5 sec x 10 x 2 IR blue TB stepping to L 5 sec x 10  Scap squeeze with noodle 10x10 shoulder flexion x 10; scaption x 10 focus on scapular control Shoulder extension with cane behind back 3 sec x 5  Shoulder adduction cane behind back 3 sec x 5  IR cane behind back 3 sec x 5 Swinging R UEfwd/back Perturbation of green ex ball on wall by PT patient stabilizing with R UE    [facility] Adult PT Treatment:                                                DATE: 04/27/2024 Therapeutic Exercise: Active shoulder flexion x 5; repeated with PT providing stabilization for shoulder girdle   Active shoulder abduction with elbows flexed to 90 deg x 5  Rolling large green therapy ball forward on table to work on R shoulder flexion 10 sec hold x 10  Rolling large green therapy ball forward on table to work on R shoulder scaption 10 sec hold x 10  Counter step back shoulder flexion 10  sec x 5   Manual Therapy: STM R shoulder girdle patient supine  PROM R shoulder into flexion 120; scaption 90; ER at side in scapular plane 40 Gentle biceps stretch supine 20 sec x 3  Neuromuscular re-education:  Pressing into therapeutic ball to work on scapular  depression 3 sec x 10  Sitting with coregeous ball thoracic spine working on activation of posterior shoulder girdle  Sitting with scap squeeze with coregeous ball working on active shoulder flexion  Standing AROM shoulder ER noodle along wall with focus on activation of posterior shoulder girdle musculature Forward prop for row with 5# DB through partial range working on posterior shoulder girdle activation and control 10 x 2  Supine scap squeeze working on activation of posterior shoulder girdle  Sitting hands together lifting UE's into shoulder flexion Therapeutic Activity: Standing:  AAROM arm at side with towel under arm ER with cane x 10  Row blue TB 3 sec x 10 x 2 ER blue step out R 5 sec x 10 x 2 IR blue TB stepping to L 5 sec x 10  Scap squeeze with noodle 10x10 shoulder flexion x 10; scaption x 10 focus on scapular control Shoulder extension with cane behind back 3 sec x 5  Shoulder adduction cane behind back 3 sec x 5  IR cane behind back 3 sec x 5 Swinging R UEfwd/back Perturbation of green ex ball on wall by PT patient stabilizing with R UE    PATIENT EDUCATION: Education details: POC; HEP Person educated: Patient Education method: Programmer, Multimedia, Demonstration, Tactile cues, Verbal cues, and Handouts Education comprehension: verbalized understanding, returned demonstration, verbal cues required, tactile cues required, and needs further education  HOME EXERCISE PROGRAM: Access Code: [email or web address] URL: [email or web address] Date: 05/17/2024 Prepared by: Gaston Dase  Exercises - Seated Scapular Retraction  - 2 x daily - 7 x weekly - 1-2 sets - 10 reps - 10 sec  hold - Seated Shoulder Shrugs  - 2 x  daily - 7 x weekly - 1-2 sets - 10 reps - 2-3 sec  hold - Seated Shoulder Flexion AAROM with Pulley Behind  - 2 x daily - 7 x weekly - 1 sets - 10 reps - 10 sec  hold - Seated Shoulder Scaption AAROM with Pulley at Side  - 2 x daily - 7 x weekly - 1 sets - 10 reps - 10sec  hold - Standing Shoulder and Trunk Flexion at Table  - 2 x daily - 7 x weekly - 1 sets - 5-10 reps - 10 sec  hold - Seated Shoulder External Rotation AAROM with Cane and Hand in Neutral  - 2 x daily - 7 x weekly - 1 sets - 5-10 reps - 5-10 sec  hold - Seated Shoulder Flexion  - 2 x daily - 7 x weekly - 1 sets - 5-10 reps - 2-3 sec  hold - Seated Shoulder Flexion Towel Slide at Table Top Full Range of Motion  - 2 x daily - 7 x weekly - 1 sets - 5-10 reps - 10sec  hold - Supine Shoulder Flexion AAROM with Hands Clasped  - 2 x daily - 7 x weekly - 1 sets - 5-10 reps - 2-3 sec  hold - Isometric Shoulder Abduction at Wall  - 2 x daily - 7 x weekly - 1 sets - 5-10 reps - 5 sec  hold - Isometric Shoulder External Rotation at Wall  - 1 x daily - 7 x weekly - 1 sets - 10 reps - 5 sec  hold - Standing Isometric Shoulder Internal Rotation with Towel Roll at Doorway  - 2 x daily - 7 x weekly - 1 sets - 5 reps - 5 sec  hold - Isometric Shoulder Extension at Wall  - 2 x daily -  7 x weekly - 1 sets - 5-10 reps - 5 sec  hold - External Rotation Reactive Isometrics with Flex Bar  - 2 x daily - 7 x weekly - 1-2 sets - 10 reps - 3-5 sec  hold - Shoulder Internal Rotation Reactive Isometrics  - 2 x daily - 7 x weekly - 1 sets - 10 reps - 3-5 sec  hold - Supine Shoulder Rhythmic Stabilization- Horizontal Abduction/Adduction  - 2 x daily - 7 x weekly - 1 sets - 5-10 reps - Supine Shoulder Flexion AAROM with Hands Clasped  - 2 x daily - 7 x weekly - 1 sets - 5-10 reps - 2-3 sec  hold - Anterior Shoulder and Biceps Stretch  - 1 x daily - 7 x weekly - 1 sets - 3 reps - 20 sec  hold - Standing Bilateral Low Shoulder Row with Anchored Resistance  - 1 x  daily - 7 x weekly - 1-3 sets - 10 reps - 2-3 sec  hold - Shoulder External Rotation with Anchored Resistance  - 1 x daily - 7 x weekly - 1-2 sets - 10 reps - 3 sec  hold - Shoulder Internal Rotation with Resistance  - 1 x daily - 7 x weekly - 1-2 sets - 10 reps - 3 sec  hold - Bicep Stretch at Table  - 1 x daily - 7 x weekly - 1 sets - 3 reps - 30 sec  hold - Shoulder External Rotation and Scapular Retraction with Resistance  - 2 x daily - 7 x weekly - 1 sets - 10 reps - 3-5 sec  hold - Standing Bilateral Low Shoulder Row with Anchored Resistance  - 2 x daily - 7 x weekly - 1-3 sets - 10 reps - 2-3 sec  hold - Shoulder External Rotation with Anchored Resistance  - 2 x daily - 7 x weekly - 1-2 sets - 10 reps - 3 sec  hold - Standing Shoulder Extension with Dowel  - 1 x daily - 7 x weekly - 1 sets - 5-10 reps - 5-10 sec  hold - Standing Bilateral Shoulder Internal Rotation AAROM with Dowel  - 1 x daily - 7 x weekly - 1 sets - 5-10 reps - 10 sec  hold - Standing Wall [employer] with Mini Swiss Ball  - 1 x daily - 7 x weekly -   hold - Supine Diaphragmatic Breathing  - 2 x daily - 7 x weekly - 1 sets - 10 reps - 4-6 sec  hold - Supine Lower Trunk Rotation  - 2 x daily - 7 x weekly - 1 sets - 3-5 reps - 20-30 sec  hold  ASSESSMENT:  CLINICAL IMPRESSION: 05/17/2024: re-assessment shows good ROM and strength in R UE. Meka reports increased functional activities with R shoulder. She has minimal pain and with exercises which resolves in ~ 5 min after exercises. Treatment consisted of ROM, stabilization, strengthening exercises. Note continued improvement in scapular and shoulder posture with  gradual improvement in scapular control, postures and movements. Note persistent scapular dyskinesis with elevation. Continued stretch for biceps and pecs and TB strengthening exercises. Goals of therapy have been accomplished and patient is independent in HEP.    Rickeya exhibits significant forward posture  with head of the humerus translating forward and shoulder girdle elevating as she reaches forward. When PT provides stabilization for the scapula and assist to maintain position for movement through partial range in elevation movement is improved.  She has no pain with partial range elevation when supported in more anatomically correct position. Continued with PROM; AAROM; light resistive exercises. Patient remains limited by pain and muscle guarding but demonstrates progress. She has difficulty relaxing for manual work and PROM exhibiting increased muscle guarding with PROM. Patient states that she is using R UE to perform ADLs and household tasks which are forward in nature but she is not lifting with R UE. Continued educated patient re-forward nature of most ADL's and sitting/sleeping postures and encouraged consistent postural awareness engaging posterior shoulder girdle to avoid anterior position of GH joint and scapulae.     Eval: Patient is a 73 y.o. female who was seen today for physical therapy evaluation and treatment  s/p R RCR, SAD, DCR 01/06/24 following nontraumatic complete tear of R RC. She presents decreased ROM and strength R UE, with R shoulder pain and decreased function. She has poor posture and alignment; limited cervical and shoulder ROM, mobility, strength, function. She will benefit from PT to address problems identified.   OBJECTIVE IMPAIRMENTS: decreased activity tolerance, decreased mobility, decreased ROM, decreased strength, increased fascial restrictions, increased muscle spasms, improper body mechanics, postural dysfunction, obesity, and pain.   GOALS: Goals reviewed with patient? Yes  SHORT TERM GOALS: Target date: 02/16/2024  Independent in initial HEP Baseline: Goal status: met  2.  Progress with PROM R shoulder per protocol  Baseline:  03/10/24: see ROM chart above Goal status: met  3.  Initiate AROM exercises per protocol  Baseline:  03/10/24: not formally  assessed given pain levels w/ PROM/AAROM, pt self-demonstrates <40 deg Goal status: met   LONG TERM GOALS: Target date: 05/18/2024   Increase AROM R shoulder to WFL's as indicated per protocol  Baseline:  03/10/24: not assessed given pain levels w/ PROM/AAROM Goal status: met  2.  4/5 to 5/5 strength R shoulder  Baseline:  03/10/24: not assessed given post op timeline Goal status: met    3.  Patient reports use of R UE for functional activities  Baseline:  03/10/24: limited by pain and post op protocol Goal status: met   4.  Patient reports ability to sleep without awakening due to R shoulder pain  Baseline:  03/10/24: able to sleep without waking due to pain Goal status: MET   5.  Independent in advanced HEP  Baseline:  03/10/24: reports good adherence w/ current HEP but does have some pain Goal status: met  6.  Improve Quick DASH by 25-30%  Baseline: 84.1/100; 84.1% 03/10/24: 52.3% Goal status: met   PT FREQUENCY: 1x/week   PT DURATION: 4 weeks  PLANNED INTERVENTIONS: 97164- PT Re-evaluation, 97110-Therapeutic exercises, 97530- Therapeutic activity, 97112- Neuromuscular re-education, 97535- Self Care, 02859- Manual therapy, 97016- Vasopneumatic device, and Patient/Family education  PLAN FOR NEXT SESSION: d/c to independent HEP  Shun Pletz P. Ina PT, MPH 05/17/24 11:00 AM

## 2024-05-27 ENCOUNTER — Ambulatory Visit: Payer: Self-pay | Admitting: Family Medicine

## 2024-07-04 ENCOUNTER — Other Ambulatory Visit: Payer: Self-pay | Admitting: Physician Assistant

## 2024-07-04 DIAGNOSIS — F411 Generalized anxiety disorder: Secondary | ICD-10-CM

## 2024-07-04 DIAGNOSIS — F410 Panic disorder [episodic paroxysmal anxiety] without agoraphobia: Secondary | ICD-10-CM

## 2024-07-25 ENCOUNTER — Other Ambulatory Visit: Payer: Self-pay | Admitting: Physician Assistant

## 2024-07-25 MED ORDER — TRELEGY ELLIPTA 200-62.5-25 MCG/ACT IN AEPB: 1.0000 | INHALATION_SPRAY | Freq: Every day | RESPIRATORY_TRACT | 5 refills | Status: AC

## 2024-07-26 ENCOUNTER — Encounter: Payer: Self-pay | Admitting: Physician Assistant

## 2024-07-26 DIAGNOSIS — J432 Centrilobular emphysema: Secondary | ICD-10-CM

## 2024-07-27 MED ORDER — ALBUTEROL SULFATE HFA 108 (90 BASE) MCG/ACT IN AERS: 2.0000 | INHALATION_SPRAY | Freq: Four times a day (QID) | RESPIRATORY_TRACT | 2 refills | Status: DC | PRN

## 2024-07-28 ENCOUNTER — Other Ambulatory Visit: Payer: Self-pay

## 2024-07-28 DIAGNOSIS — J432 Centrilobular emphysema: Secondary | ICD-10-CM

## 2024-07-28 MED ORDER — ALBUTEROL SULFATE HFA 108 (90 BASE) MCG/ACT IN AERS: 2.0000 | INHALATION_SPRAY | Freq: Four times a day (QID) | RESPIRATORY_TRACT | 2 refills | Status: AC | PRN

## 2024-07-28 MED ORDER — ALBUTEROL SULFATE HFA 108 (90 BASE) MCG/ACT IN AERS: 2.0000 | INHALATION_SPRAY | Freq: Four times a day (QID) | RESPIRATORY_TRACT | 2 refills | Status: DC | PRN

## 2024-07-31 ENCOUNTER — Other Ambulatory Visit: Payer: Self-pay | Admitting: Physician Assistant

## 2024-07-31 DIAGNOSIS — I10 Essential (primary) hypertension: Secondary | ICD-10-CM

## 2024-08-02 ENCOUNTER — Ambulatory Visit: Payer: Self-pay

## 2024-08-02 NOTE — Telephone Encounter (Signed)
 FYI Only or Action Required?: FYI only for provider: appointment scheduled on 1/14.  Patient was last seen in primary care on 05/12/2024 by [name], DO.  Called Nurse Triage reporting Breathing Problem.  Symptoms began several weeks ago.  Interventions attempted: OTC medications: numerous OTC medications and rx inhalers.  Symptoms are: gradually worsening.  Triage Disposition: See HCP Within 4 Hours (Or PCP Triage)  Patient/caregiver understands and will follow disposition?: No, wishes to speak with PCP  Copied from CRM #[id number]. Topic: Clinical - Red Word Triage >> Aug 02, 2024  4:14 PM Winona R wrote: Trouble with breathing for two weeks and has been getting worst. Pt states this has happened in the past and she would usually get a steroid. Pt was sick since christmas but it has become worst. Reason for Disposition  [1] Longstanding difficulty breathing (e.g., CHF, COPD, emphysema) AND [2] WORSE than normal  Answer Assessment - Initial Assessment Questions 1. RESPIRATORY STATUS: Describe your breathing? (e.g., wheezing, shortness of breath, unable to speak, severe coughing)      Worsening SOB with movement 2. ONSET: When did this breathing problem begin?      Christmas, had that coughing virus 3. PATTERN Does the difficult breathing come and go, or has it been constant since it started?      Occurs with movement 4. SEVERITY: How bad is your breathing? (e.g., mild, moderate, severe)      Worse than normal 5. RECURRENT SYMPTOM: Have you had difficulty breathing before? If Yes, ask: When was the last time? and What happened that time?      Hx of COPD and asthma 9. OTHER SYMPTOMS: Do you have any other symptoms? (e.g., chest pain, cough, dizziness, fever, runny nose)     Cough, denies fever, denies CP 10. O2 SATURATION MONITOR:  Do you use an oxygen saturation monitor (pulse oximeter) at home? If Yes, ask: What is your reading (oxygen level) today? What  is your usual oxygen saturation reading? (e.g., 95%)       96  Protocols used: Breathing Difficulty-A-AH

## 2024-08-02 NOTE — Telephone Encounter (Signed)
 The patient is scheduled on 08/03/24 at 730 am with the provider to address the following breathing symptoms.

## 2024-08-03 ENCOUNTER — Ambulatory Visit (INDEPENDENT_AMBULATORY_CARE_PROVIDER_SITE_OTHER): Admitting: Physician Assistant

## 2024-08-03 ENCOUNTER — Encounter: Payer: Self-pay | Admitting: Physician Assistant

## 2024-08-03 VITALS — BP 132/71 | HR 65 | Ht 63.0 in | Wt 195.0 lb

## 2024-08-03 DIAGNOSIS — J449 Chronic obstructive pulmonary disease, unspecified: Secondary | ICD-10-CM

## 2024-08-03 DIAGNOSIS — J181 Lobar pneumonia, unspecified organism: Secondary | ICD-10-CM | POA: Diagnosis not present

## 2024-08-03 DIAGNOSIS — J432 Centrilobular emphysema: Secondary | ICD-10-CM | POA: Diagnosis not present

## 2024-08-03 DIAGNOSIS — J441 Chronic obstructive pulmonary disease with (acute) exacerbation: Secondary | ICD-10-CM

## 2024-08-03 DIAGNOSIS — R918 Other nonspecific abnormal finding of lung field: Secondary | ICD-10-CM | POA: Diagnosis not present

## 2024-08-03 DIAGNOSIS — R0602 Shortness of breath: Secondary | ICD-10-CM

## 2024-08-03 DIAGNOSIS — J9801 Acute bronchospasm: Secondary | ICD-10-CM

## 2024-08-03 MED ORDER — PREDNISONE 20 MG PO TABS: ORAL_TABLET | ORAL | 0 refills | Status: AC

## 2024-08-03 NOTE — Progress Notes (Unsigned)
 "  Acute Office Visit  Subjective:     Patient ID: Ana King, female    DOB: [birth year], 74 y.o.   MRN: [id number]  Chief Complaint  Patient presents with   Shortness of Breath    HPI .SABRADiscussed the use of AI scribe software for clinical note transcription with the patient, who gave verbal consent to proceed.  History of Present Illness Ana King is a 74 year old female with chronic obstructive pulmonary disease who presents with worsening respiratory symptoms.  Dyspnea and exercise intolerance - Worsening shortness of breath over the past two weeks following an illness - Difficulty breathing, especially when lying down at night and while walking - Significant limitation in ability to exercise despite use of breathing machine and rescue inhaler - No associated fever, cough, wheezing, or peripheral edema  Home oxygenation and respiratory monitoring - Monitors oxygen saturation at home, typically ranging from 96% to 97%  Respiratory medication use - Uses Trelegy daily - Uses rescue inhaler as needed - Continues Singulair  and an over-the-counter equivalent of Claritin - Uses nasal spray for symptom management  Pulmonary imaging and lung cancer screening - Enrolled in a lung cancer screening program - Requires follow-up imaging to monitor appearance of lung consolidation - Previous lung scan was scheduled prior to surgery but was rescheduled   Durable medical equipment needs - Current nebulizer is outdated - Inquires about insurance coverage for a new nebulizer    ROS See HPI.     Objective:    BP 132/71 (Cuff Size: Normal)   Pulse 65   Ht 5' 3 (1.6 m)   Wt 195 lb (88.5 kg)   SpO2 96%   BMI 34.54 kg/m  BP Readings from Last 3 Encounters:  08/03/24 132/71  05/12/24 (!) 145/81  01/27/24 122/80   Wt Readings from Last 3 Encounters:  08/03/24 195 lb (88.5 kg)  05/12/24 196 lb (88.9 kg)  01/27/24 202 lb (91.6 kg)      Physical  Exam Constitutional:      Appearance: She is well-developed.  HENT:     Head: Normocephalic.  Pulmonary:     Effort: Pulmonary effort is normal.     Breath sounds: Wheezing present. No decreased breath sounds.  Neurological:     General: No focal deficit present.     Mental Status: She is alert.  Psychiatric:        Mood and Affect: Mood normal.          Assessment & Plan:  SABRASABRAOlivine was seen today for shortness of breath.  Diagnoses and all orders for this visit:  Centrilobular emphysema (HCC) -     For home use only DME Nebulizer machine  SOB (shortness of breath) on exertion -     predniSONE  (DELTASONE ) 20 MG tablet; Take 3 tablets for 3 days, take 2 tablets for 3 days, take 1 tablet for 3 days, take 1/2 tablet for 4 days.  Bronchospasm -     For home use only DME Nebulizer machine  COPD exacerbation (HCC) -     For home use only DME Nebulizer machine -     predniSONE  (DELTASONE ) 20 MG tablet; Take 3 tablets for 3 days, take 2 tablets for 3 days, take 1 tablet for 3 days, take 1/2 tablet for 4 days.  Lung consolidation  Abnormal CT lung screening   Assessment & Plan COPD exacerbation with centrilobular emphysema COPD exacerbation likely due to recent illness with increased inflammation. Non-infectious etiology suspected. Oxygen  saturation stable at 96-97%. - Initiated prednisone  taper. - Continue Trelegy, Singulair , and Claritin. - Ordered new nebulizer through [facility] and provided order copy. - Encouraged breathing exercises.  Abnormal CT lung screening Previous CT showed changes in the left upper lobe. Follow-up imaging recommended. - Coordinated follow-up lung CT scan with [facility].      Return if symptoms worsen or fail to improve.  Marne Meline, PA-C   "

## 2024-08-03 NOTE — Patient Instructions (Addendum)
 Start prednisone .  Order placed for nebulizer.  Get lung cancer screening scheduled.   Chronic Obstructive Pulmonary Disease Exacerbation  Chronic obstructive pulmonary disease (COPD) is a long-term (chronic) lung problem. When you have COPD, it can feel harder to breathe in or out. COPD exacerbation is a flare-up of symptoms when breathing gets worse and more treatment may be needed. Without treatment, flare-ups can be life-threatening. If they happen often, your lungs can become more damaged. What are the causes? Not taking your usual COPD medicines as told by your health care provider. A cold or the flu, which can cause infection in your lungs. Being exposed to things that make your breathing worse, such as: Smoke. Air pollution. Fumes. Dust. Allergies. Weather changes. What are the signs or symptoms? Symptoms do not get better or get worse even if you take your medicines as told by your provider. Symptoms may include: More shortness of breath. You may only be able to speak one or two words at a time. More coughing or mucus from your lungs. More wheezing or chest tightness. Being more tired and having less energy. Confusion. How is this diagnosed? This condition is diagnosed based on: Symptoms that get worse. Your medical history. A physical exam. You may also have tests, including: A chest X-ray. Blood or mucus tests. How is this treated? You may be able to stay home or you may need to go to the hospital. Treatment may include: Taking medicines. These may include: Inhalers. These have medicines in them that you breathe in. These may be more of what you already take or they may be new. Steroids. These reduce inflammation in the airways. These may be inhaled, taken by mouth, or given in an IV. Antibiotics. These treat infection. Using oxygen. Using a device to help you clear mucus. Follow these instructions at home: Medicines Take your medicines only as told by your  provider. If you were given antibiotics or steroids, take them as told by your provider. Do not stop taking them even if you start to feel better. Lifestyle Several times a day, wash your hands with soap and water for at least 20 seconds. If you cannot use soap and water, use hand sanitizer. This may help keep you from getting an infection. Avoid being around crowds or people who are sick. Do not smoke or use any products that contain nicotine or tobacco. If you need help quitting, ask your provider. Return to your normal activities when your provider says that it's safe. Use breathing methods to control your stress and catch your breath. How is this prevented? Follow your COPD action plan. The action plan tells you what to do if you're feeling good and what to do when you start feeling worse. Discuss the plan often with your provider. Make sure you get all the shots, also called vaccines, that your provider recommends. Ask your provider about a flu shot and a pneumonia shot. Use oxygen therapy if told by your provider. If you need home oxygen therapy, ask your provider how often to check your oxygen level with a device called an oximeter. Keep all follow-up visits to review your COPD action plan. Your provider will want to check on your condition often to keep you healthy and out of the hospital. Contact a health care provider if: Your COPD symptoms get worse. You have a fever or chills. You have trouble doing daily activities. You have trouble breathing even when you are resting. Get help right away if: You are  short of breath and cannot: Talk in full sentences. Do normal activities. You have chest pain. You feel confused. These symptoms may be an emergency. Call 911 right away. Do not wait to see if the symptoms will go away. Do not drive yourself to the hospital. This information is not intended to replace advice given to you by your health care provider. Make sure you discuss any  questions you have with your health care provider. Document Revised: 04/09/2023 Document Reviewed: 09/22/2022 Elsevier Patient Education  2024 [employer].

## 2024-08-07 ENCOUNTER — Encounter: Payer: Self-pay | Admitting: Physician Assistant

## 2024-08-08 ENCOUNTER — Ambulatory Visit

## 2024-08-08 DIAGNOSIS — R911 Solitary pulmonary nodule: Secondary | ICD-10-CM | POA: Diagnosis not present

## 2024-08-08 DIAGNOSIS — Z87891 Personal history of nicotine dependence: Secondary | ICD-10-CM

## 2024-08-15 ENCOUNTER — Other Ambulatory Visit: Payer: Self-pay | Admitting: Physician Assistant

## 2024-08-15 DIAGNOSIS — J4521 Mild intermittent asthma with (acute) exacerbation: Secondary | ICD-10-CM

## 2024-08-15 NOTE — Telephone Encounter (Signed)
 Requesting rx rf of singulair  10mg  Last written 10/12/2023 Last OV 08/03/2024 Upcoming appt 09/02/2024

## 2024-08-16 ENCOUNTER — Other Ambulatory Visit: Payer: Self-pay

## 2024-08-16 DIAGNOSIS — Z122 Encounter for screening for malignant neoplasm of respiratory organs: Secondary | ICD-10-CM

## 2024-08-16 DIAGNOSIS — Z87891 Personal history of nicotine dependence: Secondary | ICD-10-CM

## 2024-08-17 ENCOUNTER — Encounter: Payer: Self-pay | Admitting: Physician Assistant

## 2024-08-19 MED ORDER — MECLIZINE HCL 25 MG PO TABS: 25.0000 mg | ORAL_TABLET | Freq: Three times a day (TID) | ORAL | 0 refills | Status: AC | PRN

## 2024-08-24 ENCOUNTER — Other Ambulatory Visit: Payer: Self-pay | Admitting: Physician Assistant

## 2024-08-24 DIAGNOSIS — I10 Essential (primary) hypertension: Secondary | ICD-10-CM

## 2024-09-02 ENCOUNTER — Ambulatory Visit: Admitting: Physician Assistant

## 2024-09-05 ENCOUNTER — Ambulatory Visit: Admitting: Physician Assistant
